# Patient Record
Sex: Female | Born: 1955
Health system: Southern US, Community
[De-identification: ages and names within clinical notes are randomized; demographics above are authoritative.]

## PROBLEM LIST (undated history)

## (undated) DIAGNOSIS — J449 Chronic obstructive pulmonary disease, unspecified: Secondary | ICD-10-CM

## (undated) DIAGNOSIS — K5792 Diverticulitis of intestine, part unspecified, without perforation or abscess without bleeding: Secondary | ICD-10-CM

## (undated) DIAGNOSIS — I10 Essential (primary) hypertension: Secondary | ICD-10-CM

## (undated) DIAGNOSIS — K5790 Diverticulosis of intestine, part unspecified, without perforation or abscess without bleeding: Secondary | ICD-10-CM

## (undated) DIAGNOSIS — J189 Pneumonia, unspecified organism: Secondary | ICD-10-CM

## (undated) DIAGNOSIS — F329 Major depressive disorder, single episode, unspecified: Secondary | ICD-10-CM

## (undated) DIAGNOSIS — M797 Fibromyalgia: Secondary | ICD-10-CM

## (undated) DIAGNOSIS — M7730 Calcaneal spur, unspecified foot: Secondary | ICD-10-CM

## (undated) DIAGNOSIS — F32A Depression, unspecified: Secondary | ICD-10-CM

## (undated) DIAGNOSIS — M069 Rheumatoid arthritis, unspecified: Secondary | ICD-10-CM

## (undated) DIAGNOSIS — G4733 Obstructive sleep apnea (adult) (pediatric): Secondary | ICD-10-CM

## (undated) HISTORY — DX: Calcaneal spur, unspecified foot: M77.30

## (undated) HISTORY — DX: Rheumatoid arthritis, unspecified: M06.9

## (undated) HISTORY — DX: Diverticulitis of intestine, part unspecified, without perforation or abscess without bleeding: K57.92

## (undated) HISTORY — DX: Essential (primary) hypertension: I10

## (undated) HISTORY — PX: ROTATOR CUFF REPAIR: SHX139

## (undated) HISTORY — DX: Depression, unspecified: F32.A

## (undated) HISTORY — DX: Morbid (severe) obesity due to excess calories: E66.01

## (undated) HISTORY — DX: Pneumonia, unspecified organism: J18.9

## (undated) HISTORY — DX: Obstructive sleep apnea (adult) (pediatric): G47.33

## (undated) HISTORY — DX: Chronic obstructive pulmonary disease, unspecified: J44.9

## (undated) HISTORY — DX: Fibromyalgia: M79.7

## (undated) HISTORY — PX: CYST REMOVAL TRUNK: SHX6283

## (undated) HISTORY — DX: Diverticulosis of intestine, part unspecified, without perforation or abscess without bleeding: K57.90

## (undated) HISTORY — DX: Major depressive disorder, single episode, unspecified: F32.9

---

## 1987-12-26 HISTORY — PX: TUBAL LIGATION: SHX77

## 2010-10-28 ENCOUNTER — Encounter: Payer: Self-pay | Admitting: Cardiology

## 2010-12-12 ENCOUNTER — Encounter: Payer: Self-pay | Admitting: Cardiology

## 2010-12-13 ENCOUNTER — Encounter: Payer: Self-pay | Admitting: Cardiology

## 2010-12-14 ENCOUNTER — Encounter: Payer: Self-pay | Admitting: Cardiology

## 2011-01-03 ENCOUNTER — Encounter: Payer: Self-pay | Admitting: Cardiology

## 2011-01-03 ENCOUNTER — Ambulatory Visit
Admission: RE | Admit: 2011-01-03 | Discharge: 2011-01-03 | Payer: Self-pay | Source: Home / Self Care | Attending: Cardiology | Admitting: Cardiology

## 2011-01-03 ENCOUNTER — Encounter (INDEPENDENT_AMBULATORY_CARE_PROVIDER_SITE_OTHER): Payer: Self-pay | Admitting: *Deleted

## 2011-01-03 DIAGNOSIS — J4489 Other specified chronic obstructive pulmonary disease: Secondary | ICD-10-CM | POA: Insufficient documentation

## 2011-01-03 DIAGNOSIS — G4733 Obstructive sleep apnea (adult) (pediatric): Secondary | ICD-10-CM | POA: Insufficient documentation

## 2011-01-03 DIAGNOSIS — I429 Cardiomyopathy, unspecified: Secondary | ICD-10-CM | POA: Insufficient documentation

## 2011-01-03 DIAGNOSIS — F17200 Nicotine dependence, unspecified, uncomplicated: Secondary | ICD-10-CM | POA: Insufficient documentation

## 2011-01-03 DIAGNOSIS — J449 Chronic obstructive pulmonary disease, unspecified: Secondary | ICD-10-CM | POA: Insufficient documentation

## 2011-01-03 DIAGNOSIS — I10 Essential (primary) hypertension: Secondary | ICD-10-CM | POA: Insufficient documentation

## 2011-01-03 DIAGNOSIS — R0602 Shortness of breath: Secondary | ICD-10-CM | POA: Insufficient documentation

## 2011-01-05 ENCOUNTER — Encounter: Payer: Self-pay | Admitting: Cardiology

## 2011-01-06 ENCOUNTER — Encounter: Payer: Self-pay | Admitting: Internal Medicine

## 2011-01-06 ENCOUNTER — Ambulatory Visit
Admission: RE | Admit: 2011-01-06 | Payer: Self-pay | Source: Home / Self Care | Attending: Internal Medicine | Admitting: Internal Medicine

## 2011-01-09 LAB — POCT I-STAT 3, ART BLOOD GAS (G3+)
Acid-base deficit: 1 mmol/L (ref 0.0–2.0)
Bicarbonate: 23 mEq/L (ref 20.0–24.0)
O2 Saturation: 93 %
TCO2: 24 mmol/L (ref 0–100)
pCO2 arterial: 36.2 mmHg (ref 35.0–45.0)
pH, Arterial: 7.412 — ABNORMAL HIGH (ref 7.350–7.400)
pO2, Arterial: 64 mmHg — ABNORMAL LOW (ref 80.0–100.0)

## 2011-01-09 LAB — POCT I-STAT 3, VENOUS BLOOD GAS (G3P V)
Acid-Base Excess: 1 mmol/L (ref 0.0–2.0)
Acid-base deficit: 1 mmol/L (ref 0.0–2.0)
Bicarbonate: 24.5 mEq/L — ABNORMAL HIGH (ref 20.0–24.0)
Bicarbonate: 26.3 mEq/L — ABNORMAL HIGH (ref 20.0–24.0)
O2 Saturation: 64 %
O2 Saturation: 65 %
TCO2: 26 mmol/L (ref 0–100)
TCO2: 28 mmol/L (ref 0–100)
pCO2, Ven: 44.2 mmHg — ABNORMAL LOW (ref 45.0–50.0)
pCO2, Ven: 45.6 mmHg (ref 45.0–50.0)
pH, Ven: 7.351 — ABNORMAL HIGH (ref 7.250–7.300)
pH, Ven: 7.368 — ABNORMAL HIGH (ref 7.250–7.300)
pO2, Ven: 35 mmHg (ref 30.0–45.0)
pO2, Ven: 35 mmHg (ref 30.0–45.0)

## 2011-01-11 ENCOUNTER — Encounter: Payer: Self-pay | Admitting: Internal Medicine

## 2011-01-15 NOTE — Procedures (Addendum)
NAMESANJUANA, MRUK             ACCOUNT NO.:  [id number]  MEDICAL RECORD NO.:  [id number]          PATIENT TYPE:  OIB  LOCATION:  1961                         FACILITY:  [facility]  PHYSICIAN:  Bevelyn Buckles. Jony Ladnier, MDDATE OF BIRTH:  October 31, 1956  DATE OF PROCEDURE:  01/06/2011 DATE OF DISCHARGE:  01/06/2011                           CARDIAC CATHETERIZATION   REFERRING PHYSICIAN:  Jonelle Sidle, MD  PRIMARY CARE PHYSICIAN:  Dr. Quintin Alto.  INDICATIONS:  Ms. Lords is a very pleasant 55 year old woman with a history of hypertension and tobacco abuse.  She was recently admitted to [facility] with pneumonia.  She was found to have a LV dysfunction with an EF of 35-40% range.  She is referred for catheterization to rule out to ischemic cardiomyopathy.  PROCEDURES PERFORMED: 1. Right heart cath. 2. Selective coronary angiography. 3. Left heart cath. 4. Left ventriculogram. 5. Abdominal aortogram to assess renal arteries in the setting of     severe hypertension.  DESCRIPTION OF PROCEDURE:  The risks and indication were explained. Consent was signed and placed on the chart.  The right groin was prepped and draped in routine sterile fashion, anesthetized with 1% local lidocaine.  A 4-French arterial sheath was placed in the right femoral artery and a 7-French venous sheath was placed in the right femoral vein using modified Seldinger technique.  Standard catheters including a JL- 4, 3-D RCA angled pigtail as well as Swan-Ganz catheter were used.  No apparent complications.  Central aortic pressure initially was in the systolics of 190, but then later in the case, it was 140/85 with a mean of 109.  LV pressure 138/19 with an EDP of 30.  Right atrial pressure mean of 13, RV pressure 59/11, EDP of 18, PA pressure 60/29 with a mean of 42.  Pulmonary capillary wedge pressure mean of 27.  Fick cardiac output 6.1.  Cardiac index 2.9, pulmonary vascular resistance was  2.5 Woods units.  Left main was normal.  LAD is a long vessel coursing to the apex, gave off two diagonals.  It was angiographically normal.  Left circumflex gave off a large branching OM-1.  OM-2 was angiographically normal.  Right coronary artery was a large dominant vessel gave off an RV branch, a large acute marginal PDA, and posterolaterals angiographically normal.  Of note, the artery did have a large hypertensive quality to them.  Left ventriculogram done in the RAO position showed an ejection fraction of approximately 35-40%.  Hypokinesis appeared worse in the inferior wall.  There is no significant mitral regurgitation.  Abdominal aortogram showed widely patent renal arteries bilaterally.  ASSESSMENT: 1. Normal coronary arteries. 2. Moderate left ventricular dysfunction as described above. 3. Normal renal arteries. 4. Significant volume overload with preserved cardiac output.  PLAN/DISCUSSION:  I suspect her cardiomyopathy may be hypertensive in nature.  She does have volume on board.  We will start on spironolactone 25 mg a day to help with her volume overload as well as her blood pressure.  She will follow up in the [facility] for continued titration of her heart failure medicines and her diuretic regimen as needed.  Bevelyn Buckles. Laquashia Mergenthaler, MD     DRB/MEDQ  D:  01/06/2011  T:  01/07/2011  Job:  [id number]  cc:   Quintin Alto  Electronically Signed by Arvilla Meres MD on 01/15/2011 07:13:07 PM

## 2011-01-23 ENCOUNTER — Ambulatory Visit
Admission: RE | Admit: 2011-01-23 | Discharge: 2011-01-23 | Payer: Self-pay | Source: Home / Self Care | Attending: Cardiology | Admitting: Cardiology

## 2011-01-26 NOTE — Letter (Signed)
Summary: Internal Correspondence/ FAXED PRE-CATH ORDER  Internal Correspondence/ FAXED PRE-CATH ORDER   Imported By: Dorise Hiss 01/06/2011 11:15:23  _____________________________________________________________________  External Attachment:    Type:   Image     Comment:   External Document

## 2011-01-26 NOTE — Letter (Signed)
Summary: Cardiac Cath Instructions - [facility]  [facility] [address]   [place], [place] [address]   Phone: [phone]  Fax: [phone]     01/03/2011 MRN: [id number]  Caroline Mason [address] [place], [place]  [address]  Dear [name],   You are scheduled for a Cardiac Catheterization on Friday, January 06, 2011 with Dr. Gala Romney.  Please arrive to the 1st floor of the Heart and Vascular Center at [facility] at 10:00 am on the day of your procedure. Please do not arrive before 6:30 a.m. Call the Heart and Vascular Center at [phone] if you are unable to make your appointmnet. The Code to get into the parking garage under the building is 0030. Take the elevators to the 1st floor. You must have someone to drive you home. Someone must be with you for the first 24 hours after you arrive home. Please wear clothes that are easy to get on and off and wear slip-on shoes. Do not eat or drink after midnight except water with your medications that morning. Bring all your medications and current insurance cards with you.  __X_ DO NOT take these medications before your procedure: Benazepril/HCTZ  ___ Make sure you take your aspirin.  __X_ You may take all of your remaining medications with water that morning.  ___ DO NOT take ANY medications before your procedure.  ___ Pre-med instructions:  ________________________________________________________________________  The usual length of stay after your procedure is 2 to 3 hours. This can vary.  If you have any questions, please call the office at the number listed above.  Cyril Loosen, RN, BSN            Directions to the [facility]  Please Note : Park in [place] under the building not the parking deck.  From [employer]: Turn onto [address] Left onto [place] (1st stoplight) Right at the brick entrance to the hospital (Main  circle drive) Bear to the right and you will see a blue sign "Heart and Vascular Center" Parking garage is a sharp right'to get through the gate out in the code ___0030____. Once you park, take the elevator to the first floor. Please do not arrive before 0630am. The building will be dark before that time.   From [address] Turn onto [address] Turn left into the brick entrance to the hospital (Main circle drive) Bear to the right and you will see a blue sign "[facility]" Parking garage is a sharp right, to get thru the gate put in the code _0030___. Once you park, take the elevator to the first floor. Please do not arrive before 0630am. The building will be dark before that time

## 2011-01-26 NOTE — Consult Note (Signed)
Summary: CARDIOPYLMONARY REPORT/DR.HENDERSON  CARDIOPYLMONARY REPORT/DR.HENDERSON   Imported By: Zachary George 01/03/2011 11:00:01  _____________________________________________________________________  External Attachment:    Type:   Image     Comment:   External Document

## 2011-01-26 NOTE — Assessment & Plan Note (Signed)
[facility]   Visit Type:  Follow-up Primary Provider:  Dr. Quintin Alto   History of Present Illness: 55 year old woman seen as an inpatient consult at [facility] back in December with newly diagnosed cardiomyopathy, LVEF 35-40%, noted in the setting of a possible atypical pneumonia on baseline hypertension and obstructive sleep apnea with ongoing tobacco abuse. At that time blood cultures were negative, and sputum culture grew Klebsiella pneumoniae.  She was managed with antibiotics that were continued as an outpatient, and felt somewhat better, although continues to have NYHA class 2-3 dyspnea on exertion and intermittent cough with a feeling of chest congestion and "mucous." She denies any fevers and chills. She also describes relative orthopnea, and intermittent mild lower extremity edema.  She states that sometimes her cough seems to be worse after she eats, sometimes even with liquids, suggesting a possible component of aspiration, although she has not had any formal swallowing evaluation or other definite reason for this.   Pulmonary function test done back in November 2011 revealed an FVC of 1.54 and an FEV1 of 1.31. Flow-volume loop described as normal in the expiratory phase, not completed in the inspiratory phase. Restrictive lung volumes with DLCO of 13.8 that corrects to normal when adjusted for alveolar volume. Study quality was influenced by frequent coughing. Study interpreted by Dr. Orson Aloe.  Symptoms have been present over the last several months to a year, with possible frequent episodes of bronchitis by report, not entirely clear based on available information.  Patient states she quit smoking around the time of her last hospitalization.    Preventive Screening-Counseling & Management  Alcohol-Tobacco     Smoking Status: current     Year Started: 1990     Year Quit: 10/2010     Pack years: <1/4 PPD 20+ years  Comments: patient stated she  hasn't smoked since November 2011  Current Medications (verified): 1)  Robitussin Dm 100-10 Mg/77ml Syrp (Dextromethorphan-Guaifenesin) .... As Needed 2)  Benazepril-Hydrochlorothiazide 20-25 Mg Tabs (Benazepril-Hydrochlorothiazide) .... Take 1 Tablet By Mouth Once A Day 3)  Carvedilol 6.25 Mg Tabs (Carvedilol) .... Take 1 Tablet By Mouth Two Times A Day 4)  Hydrochlorothiazide 50 Mg Tabs (Hydrochlorothiazide) .... Take 1/2-1 Tablet By Mouth Once A Day As Needed 5)  Fluticasone Propionate 50 Mcg/act Susp (Fluticasone Propionate) .... 2 Sprays/nostrils Daily  Allergies (verified): 1)  ! Asa  Comments:  Nurse/Medical Assistant: The patient's medication bottles and allergies were reviewed with the patient and were updated in the Medication and Allergy Lists.  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  Alcohol Use - no Smoking Status:  current Pack years:  <1/4 PPD 20+ years  Review of Systems       The patient complains of dyspnea on exertion, peripheral edema, and prolonged  cough.  The patient denies anorexia, fever, weight loss, chest pain, syncope, headaches, hemoptysis, abdominal pain,  melena, hematochezia, and severe indigestion/heartburn.         Otherwise negative except as outlined above.  Vital Signs:  Patient profile:   55 year old female Height:      65 inches Weight:      229 pounds BMI:     38.25 Pulse rate:   86 / minute BP sitting:   130 / 86  (left arm) Cuff size:   large  Vitals Entered By: Caroline Mason (January 03, 2011 1:48 PM)  Nutrition Counseling: Patient's BMI is greater than 25 and therefore counseled on weight management options.  Physical Exam  Additional Exam:  Obese woman in no acute distress. HEENT: Conjunctiva and lids normal, oropharynx clear with moist mucosa. Neck: Increased girth, no obvious thyromegaly, no carotid bruits, difficult to access JVP. Lungs: Clear, somewhat rhonchorous breath sounds, no wheezing or labored breathing, occasional cough. Cardiac: Indistinct PMI, regular rate and rhythm, no S3 or loud systolic murmur. Abdomen: Soft, nontender, bowel sounds present. Skin: Warm and dry. Musculoskeletal: No gross deformities. Extremities: No pitting edema, distal pulses 1+. Neuropsychiatric: Alert and oriented x3, affect appropriate.   Echocardiogram  Procedure date:  12/12/2010  Findings:      Mildly dilated LV with LVEF 35-40%, global hypokinesis with minor regional variation, mild to moderate left atrial enlargement, normal RV function, no significant valvular abnormalities, no pericardial effusion.  CXR  Procedure date:  12/10/2010  Findings:      Cardiomegaly with pulmonary interstitial thickening. Suspect pulmonary venous congestion. No overt congestive heart failure.  EKG  Procedure date:  12/13/2010  Findings:      Sinus rhythm at 97 beats per minute with left atrial enlargement, left ventricular hypertrophy with poor R-wave progression, nonspecific ST changes, corrected QT interval 498 ms.  Impression & Recommendations:  Problem # 1:  CARDIOMYOPATHY, SECONDARY (ICD-425.9)  Newly diagnosed, LVEF  35-40% with diffuse hypokinesis. There is some family history of cardiomyopathy in two of her sisters, and this may well be a nonischemic cardiomyopathy. She does have cardiac risk factors however including tobacco use and hypertension, and reports occasional atypical sounding left-sided chest pain and arm pain. Cardiac markers were normal during her hospitalization in December. We have discussed further diagnostic evaluation and plan a left and right heart catheterization to assess coronary anatomy and hemodynamics. Cardiac medications are new, and can be further adjusted based on this. Will obtain followup lab work, and schedule catheterization with Dr. Gala Romney later this week.  Her updated medication list for this problem includes:    Benazepril-hydrochlorothiazide 20-25 Mg Tabs (Benazepril-hydrochlorothiazide) .Marland Kitchen... Take 1 tablet by mouth once a day    Carvedilol 6.25 Mg Tabs (Carvedilol) .Marland Kitchen... Take 1 tablet by mouth two times a day    Hydrochlorothiazide 50 Mg Tabs (Hydrochlorothiazide) .Marland Kitchen... Take 1/2-1 tablet by mouth once a day as needed  Orders: Cardiac Catheterization (Cardiac Cath)  Problem # 2:  SHORTNESS OF BREATH (ICD-786.05)  Likely to some degree secondary to her cardiomyopathy, however also has associated cough with chest congestion, and the possibility of aspiration based on description of her symptoms. Depending on her catheterization results, she may need further investigation, potentially with a swallowing evaluation, and/or formal Pulmonary consultation. She has been treated for episodes of presumed bronchitis and recent atypical pneumonia.  Her updated medication list for this problem includes:    Benazepril-hydrochlorothiazide 20-25 Mg Tabs (Benazepril-hydrochlorothiazide) .Marland Kitchen... Take 1 tablet by mouth  once a day    Carvedilol 6.25 Mg Tabs (Carvedilol) .Marland Kitchen... Take 1 tablet by mouth two times a day    Hydrochlorothiazide 50 Mg Tabs (Hydrochlorothiazide) .Marland Kitchen... Take 1/2-1 tablet  by mouth once a day as needed  Orders: T-Basic Metabolic Panel [phone]) T-CBC No Diff ([id number]) T-Protime, Auto ([id number]) T-PTT ([id number])  Problem # 3:  ESSENTIAL HYPERTENSION, BENIGN (ICD-401.1)  Continue medical therapy, sodium restriction.  Her updated medication list for this problem includes:    Benazepril-hydrochlorothiazide 20-25 Mg Tabs (Benazepril-hydrochlorothiazide) .Marland Kitchen... Take 1 tablet by mouth once a day    Carvedilol 6.25 Mg Tabs (Carvedilol) .Marland Kitchen... Take 1 tablet by mouth two times a day    Hydrochlorothiazide 50 Mg Tabs (Hydrochlorothiazide) .Marland Kitchen... Take 1/2-1 tablet by mouth once a day as needed  Problem # 4:  COPD (ICD-496)  Does not seem to explain symptomatology.  Problem # 5:  TOBACCO ABUSE (ICD-305.1)  Patient has stopped smoking as of her last hospitalization in December.  Problem # 6:  OBSTRUCTIVE SLEEP APNEA (VOZ-366.44)  Not certain about adequacy of followup or treatment for this. May also impact her from the perspective of the cardiomyopathy.  Patient Instructions: 1)  Your physician has requested that you have a cardiac catheterization.  Cardiac catheterization is used to diagnose and/or treat various heart conditions. Doctors may recommend this procedure for a number of different reasons. The most common reason is to evaluate chest pain. Chest pain can be a symptom of coronary artery disease (CAD), and cardiac catheterization can show whether plaque is narrowing or blocking your heart's arteries. This procedure is also used to evaluate the valves, as well as measure the blood flow and oxygen levels in different parts of your heart.  For further information please visit [email or web address]  Please follow instruction sheet, as given. 2)  Your physician recommends that you go to the [facility] for lab work: DO TODAY.

## 2011-01-26 NOTE — Letter (Signed)
Summary: [facility] D/C DR. Wende Crease  [facility] D/C DR. Wende Crease   Imported By: Zachary George 01/03/2011 10:59:05  _____________________________________________________________________  External Attachment:    Type:   Image     Comment:   External Document

## 2011-01-26 NOTE — Consult Note (Signed)
Summary: CARDIOLOGY CONSULT [facility]  CARDIOLOGY CONSULT [facility]   Imported By: Zachary George 01/03/2011 11:00:43  _____________________________________________________________________  External Attachment:    Type:   Image     Comment:   External Document

## 2011-02-01 NOTE — Assessment & Plan Note (Signed)
Summary: EPH - POST CATH   Visit Type:  Follow-up Primary Provider:  Dr. Quintin Alto   History of Present Illness: 55 year old woman presents for followup. She was seen recently on 10 January with newly diagnosed cardiomyopathy, LVEF 35-40%, noted in the setting of a possible atypical pneumonia on baseline hypertension and obstructive sleep apnea with ongoing tobacco abuse. Subsequently she was referred for a cardiac catheterization, outlined below.  She had no evidence of significant CAD, hemodynamics as noted. Aldactone was added.  Followup labs from 18 January showed glucose 94, BUN 10, creatinine 0.6, sodium 140, potassium 4.0, BNP 251. We reviewed this today.  She continues to complain of fatigue, dyspnea on exertion at NYHA class 2-3. Also intermittent atypical chest pain. No palpitations or syncope.  I discussed the implications of her cardiomyopathy has a relates to further ongoing medication adjustments, potentially additional testing down the road to better assess functional capacity if her symptoms continue. She has not been weighing herself regularly. We discussed this, as well as sodium and fluid restriction. Also a regular exercise regimen.  Preventive Screening-Counseling & Management  Alcohol-Tobacco     Smoking Status: quit     Year Quit: 10/2010  Current Medications (verified): 1)  Robitussin Dm 100-10 Mg/19ml Syrp (Dextromethorphan-Guaifenesin) .... As Needed 2)  Benazepril-Hydrochlorothiazide 20-25 Mg Tabs (Benazepril-Hydrochlorothiazide) .... Take 1 Tablet By Mouth Once A Day 3)  Carvedilol 6.25 Mg Tabs (Carvedilol) .... Take 1 Tablet By Mouth Two Times A Day 4)  Hydrochlorothiazide 50 Mg Tabs (Hydrochlorothiazide) .... Take 1/2-1 Tablet By Mouth Once A Day As Needed 5)  Fluticasone Propionate 50 Mcg/act Susp (Fluticasone Propionate) .... 2 Sprays/nostrils Daily 6)  Ferrous Sulfate 325 (65 Fe) Mg Tabs (Ferrous Sulfate) .... Take 1 Tablet By Mouth Two Times A  Day 7)  Spironolactone 25 Mg Tabs (Spironolactone) .... Take 1 Tablet By Mouth Once A Day  Allergies (verified): 1)  ! Asa  Comments:  Nurse/Medical Assistant: The patient's medication bottles and allergies were reviewed with the patient and were updated in the Medication and Allergy Lists.  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  Alcohol Use - no  Social History: Smoking Status:  quit  Review of Systems  The patient denies anorexia, fever, weight gain, syncope, peripheral edema, prolonged cough, headaches, abdominal pain, melena, and hematochezia.         Otherwise reviewed and negative except as outlined.  Vital Signs:  Patient profile:   56 year old female Height:      65 inches Weight:      231 pounds Pulse rate:   78 / minute BP sitting:   129 / 84  (left arm) Cuff size:   large  Vitals Entered By: Carlye Grippe (January 23, 2011 2:54 PM)  Physical Exam  Additional Exam:  Obese woman in no acute distress. HEENT: Conjunctiva and lids normal, oropharynx clear with moist mucosa. Neck: Increased girth, no obvious thyromegaly, no carotid bruits, difficult to access JVP. Lungs: Clear, no wheezing or labored breathing. Cardiac: Indistinct PMI, regular rate and rhythm, no S3 or loud systolic murmur. Abdomen: Soft, nontender, bowel sounds present. Skin: Warm and dry. Musculoskeletal: No gross deformities. Extremities: No pitting edema, distal pulses 1+. Neuropsychiatric: Alert and oriented x3,  affect appropriate.   Cardiac Cath  Procedure date:  01/07/2011  Findings:       Central aortic pressure initially was in the   systolics  of 190, but then later in the case, it was 140/85 with a mean   of 109.  LV pressure 138/19 with an EDP of 30.  Right atrial pressure   mean of 13, RV pressure 59/11, EDP of 18, PA pressure 60/29 with a mean   of 42.  Pulmonary capillary wedge pressure mean of 27.  Fick cardiac   output 6.1.  Cardiac index 2.9, pulmonary vascular resistance was 2.5   Woods units.      Left main was normal.      LAD is a long vessel coursing to the apex, gave off two diagonals.  It   was angiographically normal.      Left circumflex gave off a large branching OM-1.  OM-2 was   angiographically normal.  Right coronary artery was a large dominant   vessel gave off an RV branch, a large acute marginal PDA, and   posterolaterals angiographically normal.      Of note, the artery did have a large hypertensive quality to them.      Left ventriculogram done in the RAO position showed an ejection fraction   of approximately 35-40%.  Hypokinesis appeared worse in the inferior   wall.  There is no significant mitral regurgitation.  Abdominal   aortogram showed widely patent renal arteries bilaterally.  Impression & Recommendations:  Problem # 1:  CARDIOMYOPATHY, SECONDARY (ICD-425.9)  Nonischemic cardiomyopathy. Plan to modify medical therapy by changing benazepril to 20 mg p.o. b.i.d., discontinuing hydrochlorothiazide, and using Lasix 20 mg p.o. p.r.n. instead. Otherwise continue present regimen. I'll have her follow up over the next month with repeat lab work.  The following medications were removed from the medication list:    Benazepril-hydrochlorothiazide 20-25 Mg Tabs (Benazepril-hydrochlorothiazide) .Marland Kitchen... Take 1 tablet by mouth once a day    Hydrochlorothiazide 50 Mg Tabs (Hydrochlorothiazide) .Marland Kitchen... Take 1/2-1 tablet by mouth once a day as needed Her updated medication list for this problem includes:    Carvedilol 6.25 Mg Tabs (Carvedilol) .Marland Kitchen... Take 1 tablet by mouth two times a day    Spironolactone  25 Mg Tabs (Spironolactone) .Marland Kitchen... Take 1 tablet by mouth once a day    Benazepril Hcl 20 Mg Tabs (Benazepril hcl) .Marland Kitchen... Take 1 tablet by mouth two times a day    Furosemide 20 Mg Tabs (Furosemide) .Marland Kitchen... Take one tablet by mouth as needed  Problem # 2:  ESSENTIAL HYPERTENSION, BENIGN (ICD-401.1)  Continue to work on blood pressure control. We have discussed weight loss, sodium restriction.  The following medications were removed from the medication list:    Benazepril-hydrochlorothiazide 20-25 Mg Tabs (Benazepril-hydrochlorothiazide) .Marland Kitchen... Take 1 tablet by mouth once a day    Hydrochlorothiazide 50 Mg Tabs (Hydrochlorothiazide) .Marland Kitchen... Take 1/2-1 tablet by mouth once a day as needed Her updated medication list for this problem includes:    Carvedilol 6.25 Mg Tabs (Carvedilol) .Marland Kitchen... Take 1 tablet by mouth two times a day    Spironolactone 25 Mg Tabs (Spironolactone) .Marland Kitchen... Take 1 tablet by mouth once a day    Benazepril Hcl 20 Mg Tabs (Benazepril hcl) .Marland Kitchen... Take 1 tablet by mouth two times a day    Furosemide 20 Mg Tabs (Furosemide) .Marland Kitchen... Take one tablet by mouth as needed  Problem # 3:  TOBACCO ABUSE (ICD-305.1)  Patient quit smoking back  in November, has not resumed.  Other Orders: Future Orders: T-Basic Metabolic Panel [phone]) ... 02/20/2011 T-BNP  (B Natriuretic Peptide) [phone]) ... 02/20/2011  Patient Instructions: 1)  Follow up with Dr. Diona Browner on Wed, March 01, 2011 at 1:40pm. 2)  Stop Benazepril/HCTZ and as needed HCTZ. 3)  Start Benazepril 20mg  by mouth two times a day. 4)  Start Lasix 20mg  by mouth as needed. 5)  Your physician recommends that you go to the [facility] for lab work: DO LABS A COUPLE OF DAYS BEFORE YOUR NEXT OFFICE VISIT. Prescriptions: FUROSEMIDE 20 MG TABS (FUROSEMIDE) Take one tablet by mouth as needed  #30 x 3   Entered by:   Cyril Loosen, RN, BSN   Authorized by:   Loreli Slot, MD, [facility]   Signed by:   Cyril Loosen, RN, BSN  on 01/23/2011   Method used:   Electronically to        [facility] # [phone]* (retail)       [address]       [place], [place]  [address]       Ph: [phone] or [phone]       Fax: [phone]   RxID:   [id number] BENAZEPRIL HCL 20 MG TABS (BENAZEPRIL HCL) Take 1 tablet by mouth two times a day  #30 x 6   Entered by:   Cyril Loosen, RN, BSN   Authorized by:   Loreli Slot, MD, [facility]   Signed by:   Cyril Loosen, RN, BSN on 01/23/2011   Method used:   Electronically to        [facility] # [phone]* (retail)       [address]       [place], [place]  [address]       Ph: [phone] or [phone]       Fax: [phone]   RxID:   [id number]

## 2011-02-21 ENCOUNTER — Encounter: Payer: Self-pay | Admitting: Cardiology

## 2011-02-23 ENCOUNTER — Encounter (INDEPENDENT_AMBULATORY_CARE_PROVIDER_SITE_OTHER): Payer: Self-pay | Admitting: *Deleted

## 2011-03-01 ENCOUNTER — Ambulatory Visit (INDEPENDENT_AMBULATORY_CARE_PROVIDER_SITE_OTHER): Payer: PRIVATE HEALTH INSURANCE | Admitting: Cardiology

## 2011-03-01 ENCOUNTER — Encounter: Payer: Self-pay | Admitting: Physician Assistant

## 2011-03-01 DIAGNOSIS — D649 Anemia, unspecified: Secondary | ICD-10-CM | POA: Insufficient documentation

## 2011-03-01 DIAGNOSIS — I5022 Chronic systolic (congestive) heart failure: Secondary | ICD-10-CM

## 2011-03-02 NOTE — Letter (Signed)
Summary: Engineer, materials at [facility]  [address]   [place], [place] [address]   Phone: [phone]  Fax: [phone]        February 23, 2011 MRN: [id number]    Caroline Mason [address] [place], [place]  [address]    Dear Ms. [facility],  Your test ordered by Caroline Mason has been reviewed by your physician (or physician assistant) and was found to be normal or stable. Your physician (or physician assistant) felt no changes were needed at this time.  ____ Echocardiogram  ____ Cardiac Stress Test  __X__ Lab Work-Stable...we will discuss further at your upcoming office visit.  ____ Peripheral vascular study of arms, legs or neck  ____ CT scan or X-ray  ____ Lung or Breathing test  ____ Other:   Thank you.   Cyril Loosen, RN, BSN    Duane Boston, M.D., F.A.C.C. Thressa Sheller, M.D., F.A.C.C. Oneal Grout, M.D., F.A.C.C. Cheree Ditto, M.D., F.A.C.C. Daiva Nakayama, M.D., F.A.C.C. Kenney Houseman, M.D., F.A.C.C. Jeanne Ivan, PA-C

## 2011-03-03 ENCOUNTER — Encounter: Payer: Self-pay | Admitting: Cardiology

## 2011-03-07 NOTE — Assessment & Plan Note (Signed)
Summary: 1 MO F/U PER 1/30 OV-JM   Visit Type:  Follow-up Primary Provider:  Dr. Quintin Alto   History of Present Illness: patient presents for scheduled followuparea  When last seen, medication adjustments, per Dr. Diona Browner, up notable for discontinuing HCTZ, increasing benazepril, and using Lasix p.r.n. Followup labs were drawn, every 28, notable for potassium 3.8, BUN 9, creatinine 0.7.  Patient continues to complain of easy fatigability, DOE, and occasional palpitations. She also has atypical anterior chest discomfort, associated with eating.  Of note, patient has OSA, but is not on CPAP, secondary to financial constraints. She has also not been able to return to work, citing extremely limited exercise tolerance, even with minimal exertion.  Preventive Screening-Counseling & Management  Alcohol-Tobacco     Smoking Status: quit     Year Quit: 2-3 months ago  Current Medications (verified): 1)  Robitussin Dm 100-10 Mg/55ml Syrp (Dextromethorphan-Guaifenesin) .... As Needed 2)  Carvedilol 6.25 Mg Tabs (Carvedilol) .... Take 1 Tablet By Mouth Two Times A Day 3)  Fluticasone Propionate 50 Mcg/act Susp (Fluticasone Propionate) .... 2 Sprays/nostrils Daily 4)  Ferrous Sulfate 325 (65 Fe) Mg Tabs (Ferrous Sulfate) .... Take 1 Tablet By Mouth Two Times A Day 5)  Spironolactone 25 Mg Tabs (Spironolactone) .... Take 1 Tablet By Mouth Once A Day 6)  Benazepril Hcl 20 Mg Tabs (Benazepril Hcl) .... Take 1 Tablet By Mouth Two Times A Day 7)  Furosemide 20 Mg Tabs (Furosemide) .... Take One Tablet By Mouth As Needed  Allergies (verified): 1)  ! Jonne Ply  Past History:  Past Medical History: Last updated: 01/03/2011 Hypertension Obstructive sleep apnea Cardiomyopathy, LVEF 35-40%  Atypical pneumonia 12/11 Depression Fibromyalgia Rheumatoid arthritis Morbid obesity C O P D  Review of Systems       No fevers, chills, hemoptysis, dysphagia, melena, hematocheezia, hematuria, rash,  claudication, orthopnea, pnd, pedal edema. All other systems negative.   Vital Signs:  Patient profile:   55 year old female Weight:      235 pounds Pulse rate:   76 / minute BP sitting:   146 / 95  (left arm) Cuff size:   large  Vitals Entered By: Hoover Brunette, LPN (February 28, 6605 1:59 PM) Is Patient Diabetic? No Comments 1 month f/u   Physical Exam  Additional Exam:  GEN: 55 year old female, obese, sitting upright, no distress HEENT: NCAT,PERRLA,EOMI NECK: palpable pulses, no bruits; no JVD; no TM LUNGS: CTA bilaterally HEART: RRR (S1S2); no significant murmurs; no rubs; no gallops ABD: soft, NT; intact BS EXT: intact distal pulses; no edema SKIN: warm, dry MUSC: no obvious deformity NEURO: A/O (x3)     Impression & Recommendations:  Problem # 1:  CARDIOMYOPATHY, SECONDARY (ICD-425.9)  Will increase carvedilol to 12.5 mg b.i.d., with goal of 25 mg b.i.d. Patient has gained 4 pounds, but attributes this to increased appetite. She does not present with symptoms suggestive of volume overload. We once again discussed the importance of dietary sodium restriction, as well as daily weights. I also recommended cardiac rehabilitation, which she will consider. Will plan return visit in 2 months, with Dr. Diona Browner.  Problem # 2:  ESSENTIAL HYPERTENSION, BENIGN (ICD-401.1)  needs better control. Coreg will be increased. Continue current dose of spironolactone and benazepril. Will check followup labs, including BNP level.  Problem # 3:  SHORTNESS OF BREATH (ICD-786.05)  continues to be plagued by significant DOE. Will order a 6 minute walk. May need referral to pulmonologist, which the patient suggested herself.  Problem # 4:  ANEMIA (ICD-285.9)  followed by Dr. Leandrew Koyanagi. Patient on supplemental iron. No current indication to add aspirin.  Appended Document: [facility]     Allergies: 1)  ! Jonne Ply   Other Orders: Cardiac Rehabilitation (Cardiac Rehab) T-Basic  Metabolic Panel [phone]) T-BNP  (B Natriuretic Peptide) [phone]) Six Minute Walk (Six Minute Walk)  Patient Instructions: 1)  Follow up with Dr. Diona Browner on  [facility], May 03, 2011 AT 1PM. 2)  Your physician recommends referral and attendance at a Cardiac Rehab Program. 3)  6-minute walk test. 4)  Your physician recommends that you go to the [facility] for lab work: BMET/BNP 5)  Increase Coreg (carvedilol) to 12.5mg  by mouth two times a day. You may take 2 of your 6.25mg  tablets until gone and then start new prescription for 12.5 mg tablets.  6)  No salt added diet. 7)  Weigh Daily. Prescriptions: CARVEDILOL 12.5 MG TABS (CARVEDILOL) Take one tablet by mouth twice a day  #60 x 6   Entered by:   Cyril Loosen, RN, BSN   Authorized by:   Nelida Meuse, PA-C   Signed by:   Cyril Loosen, RN, BSN on 03/01/2011   Method used:   Electronically to        [facility] # [phone]* (retail)       [address]       [place], [place]  [address]       Ph: [phone] or [phone]       Fax: [phone]   RxID:   [id number]

## 2011-03-08 ENCOUNTER — Encounter: Payer: Self-pay | Admitting: Physician Assistant

## 2011-03-09 ENCOUNTER — Encounter: Payer: Self-pay | Admitting: *Deleted

## 2011-03-14 NOTE — Miscellaneous (Signed)
Summary: Rehab Report/  FAXED CARDIAC REHAB REFERRAL  Rehab Report/  FAXED CARDIAC REHAB REFERRAL   Imported By: Dorise Hiss 03/09/2011 14:05:35  _____________________________________________________________________  External Attachment:    Type:   Image     Comment:   External Document

## 2011-03-14 NOTE — Letter (Signed)
Summary: Generic Engineer, agricultural at [facility] [address]   [place], [place] [address]   Phone: [phone]  Fax: [phone]        March 09, 2011 MRN: [id number]    Caroline Mason [address] [place], [place]  [address]    Dear Ms. Gigante,   We have attempted to reach you several times without success regarding your recent lab results.   Please contact our office at your earliest convenience for these results.      Sincerely,  Cyril Loosen, RN, BSN  This letter has been electronically signed by your physician.

## 2011-03-16 ENCOUNTER — Encounter: Payer: Self-pay | Admitting: *Deleted

## 2011-03-24 ENCOUNTER — Encounter: Payer: Self-pay | Admitting: Physician Assistant

## 2011-05-02 ENCOUNTER — Encounter: Payer: Self-pay | Admitting: Cardiology

## 2011-05-03 ENCOUNTER — Ambulatory Visit: Payer: PRIVATE HEALTH INSURANCE | Admitting: Cardiology

## 2011-05-08 ENCOUNTER — Other Ambulatory Visit: Payer: Self-pay | Admitting: *Deleted

## 2011-05-08 MED ORDER — BENAZEPRIL HCL 20 MG PO TABS: 20.0000 mg | ORAL_TABLET | Freq: Two times a day (BID) | ORAL | Status: DC

## 2011-06-05 ENCOUNTER — Encounter: Payer: Self-pay | Admitting: Cardiology

## 2011-06-05 ENCOUNTER — Ambulatory Visit (INDEPENDENT_AMBULATORY_CARE_PROVIDER_SITE_OTHER): Payer: PRIVATE HEALTH INSURANCE | Admitting: Cardiology

## 2011-06-05 DIAGNOSIS — I5022 Chronic systolic (congestive) heart failure: Secondary | ICD-10-CM

## 2011-06-05 DIAGNOSIS — I10 Essential (primary) hypertension: Secondary | ICD-10-CM

## 2011-06-05 DIAGNOSIS — I429 Cardiomyopathy, unspecified: Secondary | ICD-10-CM

## 2011-06-05 DIAGNOSIS — R0602 Shortness of breath: Secondary | ICD-10-CM

## 2011-06-05 MED ORDER — FUROSEMIDE 20 MG PO TABS: 20.0000 mg | ORAL_TABLET | Freq: Every day | ORAL | Status: DC

## 2011-06-05 MED ORDER — BENAZEPRIL HCL 20 MG PO TABS: 20.0000 mg | ORAL_TABLET | Freq: Two times a day (BID) | ORAL | Status: DC

## 2011-06-05 NOTE — Assessment & Plan Note (Signed)
Blood pressure is better controlled 

## 2011-06-05 NOTE — Patient Instructions (Signed)
Follow up as scheduled. Your physician has requested that you have an echocardiogram. Echocardiography is a painless test that uses sound waves to create images of your heart. It provides your doctor with information about the size and shape of your heart and how well your heart's chambers and valves are working. This procedure takes approximately one hour. There are no restrictions for this procedure. DO IN 6 WEEKS BEFORE APPT. Your physician recommends that you continue on your current medications as directed. Please refer to the Current Medication list given to you today.

## 2011-06-05 NOTE — Progress Notes (Signed)
Clinical Summary Ms. Dorff is a 55 y.o.female presenting for office followup. She was seen in March of this year, at that time complaining of continued dyspnea on exertion. Medical therapy adjustments were made. She was also referred for a 6 minute walk, covering approximately 448 m at brisk pace, no desaturation with exercise, but reported moderate dyspnea.  She was also initiated in cardiac rehabilitation, states that she has enjoyed this, and feels much better in terms of her exertional shortness of breath. She has better stamina, reports tolerating her medications. She states she plans to continue exercise in the maintenance program.  Denies any significant chest pain, palpitations, syncope.   Allergies  Allergen Reactions  . Aspirin     REACTION: GI UPSET    Current outpatient prescriptions:benazepril (LOTENSIN) 20 MG tablet, Take 1 tablet (20 mg total) by mouth 2 (two) times daily. PATIENT NEED OFFICE VISIT, Disp: 60 tablet, Rfl: 0;  carvedilol (COREG) 12.5 MG tablet, Take 12.5 mg by mouth 2 (two) times daily with a meal.  , Disp: , Rfl: ;  escitalopram (LEXAPRO) 20 MG tablet, Take 20 mg by mouth daily.  , Disp: , Rfl: ;  furosemide (LASIX) 20 MG tablet, Take 20 mg by mouth daily.  , Disp: , Rfl:  guaiFENesin-dextromethorphan (ROBITUSSIN DM) 100-10 MG/5ML syrup, Take 5 mLs by mouth 3 (three) times daily as needed.  , Disp: , Rfl: ;  sodium chloride (OCEAN) 0.65 % nasal spray, Place 1 spray into the nose as needed.  , Disp: , Rfl: ;  spironolactone (ALDACTONE) 25 MG tablet, Take 25 mg by mouth daily.  , Disp: , Rfl: ;  ferrous sulfate 325 (65 FE) MG tablet, Take 325 mg by mouth 2 (two) times daily.  , Disp: , Rfl:  DISCONTD: fluticasone (FLONASE) 50 MCG/ACT nasal spray, Place 2 sprays into the nose daily.  , Disp: , Rfl:   Past Medical History  Diagnosis Date  . Essential hypertension, benign   . Obstructive sleep apnea   . Cardiomyopathy     LVEF 35-40%  . Atypical pneumonia     12/11   . Depression   . Fibromyalgia   . Rheumatoid arthritis   . Morbid obesity   . COPD (chronic obstructive pulmonary disease)     Past Surgical History  Procedure Date  . Tubal ligation 1989    Family History  Problem Relation Age of Onset  . Stroke Mother   . Heart failure Sister   . Heart failure Sister     Social History Ms. Lathon reports that she has been smoking Cigarettes.  She has a 9 pack-year smoking history. She has never used smokeless tobacco. Ms. Fernando reports that she does not drink alcohol.  Review of Systems Otherwise reviewed and negative.  Physical Examination Filed Vitals:   06/05/11 1457  BP: 125/80  Pulse: 58  Overweight woman in no acute distress. HEENT: Conjunctiva and lids normal, oropharynx with moist mucosa. Neck: Supple, no elevated JVP or carotid bruits, no thyromegaly. Lungs: Clear to auscultation, nonlabored. Cardiac: Regular rate and rhythm, no S3 gallop. Abdomen: Soft, nontender, bowel sounds present. Skin: Warm and dry. Extremities: No pitting edema, distal pulses full. Musculoskeletal: No kyphosis. Neuropsychiatric: Alert and oriented x3, affect appropriate.   ECG Sinus rhythm at 66 beats per minute with poor anterior R wave progression.   Problem List and Plan

## 2011-06-05 NOTE — Assessment & Plan Note (Signed)
Doing relatively well, reportedly less shortness of breath and with more stamina, to complete the cardiac rehabilitation program next week. She reports tolerating her medications, and compliance with them. 6 minute walk test is reviewed above. Plan to continue medical therapy and followup with a 2-D echocardiogram prior to her next visit.

## 2011-06-05 NOTE — Assessment & Plan Note (Signed)
Nonischemic cardiomyopathy, normal coronary arteries at catheterization.

## 2011-06-08 ENCOUNTER — Encounter: Payer: Self-pay | Admitting: Cardiology

## 2011-07-13 DIAGNOSIS — I5022 Chronic systolic (congestive) heart failure: Secondary | ICD-10-CM

## 2011-07-18 ENCOUNTER — Ambulatory Visit (INDEPENDENT_AMBULATORY_CARE_PROVIDER_SITE_OTHER): Payer: PRIVATE HEALTH INSURANCE | Admitting: Cardiology

## 2011-07-18 ENCOUNTER — Encounter: Payer: Self-pay | Admitting: Cardiology

## 2011-07-18 VITALS — BP 143/88 | HR 48 | Resp 20 | Ht 65.0 in | Wt 227.1 lb

## 2011-07-18 DIAGNOSIS — I498 Other specified cardiac arrhythmias: Secondary | ICD-10-CM

## 2011-07-18 DIAGNOSIS — R001 Bradycardia, unspecified: Secondary | ICD-10-CM | POA: Insufficient documentation

## 2011-07-18 DIAGNOSIS — I5022 Chronic systolic (congestive) heart failure: Secondary | ICD-10-CM

## 2011-07-18 DIAGNOSIS — I429 Cardiomyopathy, unspecified: Secondary | ICD-10-CM

## 2011-07-18 MED ORDER — CARVEDILOL 6.25 MG PO TABS: 6.2500 mg | ORAL_TABLET | Freq: Two times a day (BID) | ORAL | Status: DC

## 2011-07-18 NOTE — Assessment & Plan Note (Signed)
Seems to be fairly euvolemic. No changes to present diuretic regimen. Reminded herabout diet and sodium restriction, also regular exercise.

## 2011-07-18 NOTE — Progress Notes (Signed)
Clinical Summary Caroline Mason is a 55 y.o.female presenting for followup. She was seen recently in June. Followup echocardiogram is noted below.  LVEF has improved somewhat on medical therapy. She states that she recently completed cardiac rehabilitation, also underwent some pulmonary function studies as part of a disability determination. She states she has been more fatigued in the last month. I reviewed her medications, also noted her to be fairly bradycardic.  She reports stable appetite, no progressive lower extremity edema, no orthopnea or PND. No palpitations or syncope.   Allergies  Allergen Reactions  . Aspirin     REACTION: GI UPSET    Current outpatient prescriptions:benazepril (LOTENSIN) 20 MG tablet, Take 1 tablet (20 mg total) by mouth 2 (two) times daily., Disp: 60 tablet, Rfl: 6;  escitalopram (LEXAPRO) 20 MG tablet, Take 20 mg by mouth daily.  , Disp: , Rfl: ;  furosemide (LASIX) 20 MG tablet, Take 1 tablet (20 mg total) by mouth daily., Disp: 30 tablet, Rfl: 6;  spironolactone (ALDACTONE) 25 MG tablet, Take 25 mg by mouth daily.  , Disp: , Rfl:  carvedilol (COREG) 6.25 MG tablet, Take 1 tablet (6.25 mg total) by mouth 2 (two) times daily., Disp: 60 tablet, Rfl: 6;  ferrous sulfate 325 (65 FE) MG tablet, Take 325 mg by mouth 2 (two) times daily.  , Disp: , Rfl: ;  fluticasone (FLONASE) 50 MCG/ACT nasal spray, Place 2 sprays into the nose daily.  , Disp: , Rfl:  guaiFENesin-dextromethorphan (ROBITUSSIN DM) 100-10 MG/5ML syrup, Take 5 mLs by mouth 3 (three) times daily as needed.  , Disp: , Rfl: ;  sodium chloride (OCEAN) 0.65 % nasal spray, Place 1 spray into the nose as needed.  , Disp: , Rfl:   Past Medical History  Diagnosis Date  . Essential hypertension, benign   . Obstructive sleep apnea   . Cardiomyopathy     Nonischemic, normal coronaries, LVEF 35-40%  . Atypical pneumonia     12/11  . Depression   . Fibromyalgia   . Rheumatoid arthritis   . Morbid obesity   . COPD  (chronic obstructive pulmonary disease)     Past Surgical History  Procedure Date  . Tubal ligation 1989    Family History  Problem Relation Age of Onset  . Stroke Mother   . Heart failure Sister 35  . Heart failure Sister 46    Social History Caroline Mason reports that she has been smoking Cigarettes.  She has a 9 pack-year smoking history. She has never used smokeless tobacco. Caroline Mason reports that she does not drink alcohol.  Review of Systems As outlined above, otherwise negative.  Physical Examination Filed Vitals:   07/18/11 1454  BP: 143/88  Pulse: 48  Resp: 20  Overweight woman in no acute distress.  HEENT: Conjunctiva and lids normal, oropharynx with moist mucosa.  Neck: Supple, no elevated JVP or carotid bruits, no thyromegaly.  Lungs: Clear to auscultation, nonlabored.  Cardiac: Regular rate and rhythm, no S3 gallop.  Abdomen: Soft, nontender, bowel sounds present.  Skin: Warm and dry.  Extremities: No pitting edema, distal pulses full.  Musculoskeletal: No kyphosis.  Neuropsychiatric: Alert and oriented x3, affect appropriate.    Studies Echocardiogram 07/13/2011: Mild LVH with global hypokinesis, LVEF 40-45%, diastolic dysfunction, mild left atrial enlargement, trace mitral regurgitation, mild tricuspid regurgitation, RVSP 36 mmHg.   Problem List and Plan

## 2011-07-18 NOTE — Assessment & Plan Note (Signed)
Reduce Coreg to 6.25 mg p.o. B.i.d. Perhaps bradycardia is leading to some functional limitation as well.

## 2011-07-18 NOTE — Assessment & Plan Note (Signed)
Nonischemic cardiomyopathy with normal coronary arteries at catheterization.

## 2011-07-18 NOTE — Patient Instructions (Signed)
   Decrease Coreg to 6.25mg  twice a day  Follow up in  3 months

## 2011-10-12 ENCOUNTER — Encounter: Payer: Self-pay | Admitting: Cardiology

## 2011-10-17 ENCOUNTER — Ambulatory Visit (INDEPENDENT_AMBULATORY_CARE_PROVIDER_SITE_OTHER): Payer: PRIVATE HEALTH INSURANCE | Admitting: Cardiology

## 2011-10-17 ENCOUNTER — Encounter: Payer: Self-pay | Admitting: Cardiology

## 2011-10-17 VITALS — BP 147/86 | HR 63 | Ht 65.0 in | Wt 233.8 lb

## 2011-10-17 DIAGNOSIS — I5022 Chronic systolic (congestive) heart failure: Secondary | ICD-10-CM

## 2011-10-17 DIAGNOSIS — I429 Cardiomyopathy, unspecified: Secondary | ICD-10-CM

## 2011-10-17 DIAGNOSIS — G4733 Obstructive sleep apnea (adult) (pediatric): Secondary | ICD-10-CM

## 2011-10-17 NOTE — Progress Notes (Signed)
Clinical Summary Caroline Mason is a 55 y.o.female presenting for followup. She was seen in July. She reports NYHA class II dyspnea on exertion, no chest pain or palpitations. Indicates compliance with her medications. She has not been exercising as regularly.  Reports a recent cold, no fevers or chills, no productive cough. She has not had a flu shot as yet.  She states that Dr. Leandrew Koyanagi has been following lipids, and also has recommended a followup sleep study. This is a very reasonable idea in light of her cardiomyopathy. She is not on CPAP therapy.   Allergies  Allergen Reactions  . Aspirin     REACTION: GI UPSET    Medication list reviewed.  Past Medical History  Diagnosis Date  . Essential hypertension, benign   . Obstructive sleep apnea   . Cardiomyopathy     Nonischemic, normal coronaries, LVEF 40-45%  . Atypical pneumonia     12/11  . Depression   . Fibromyalgia   . Rheumatoid arthritis   . Morbid obesity   . COPD (chronic obstructive pulmonary disease)     Past Surgical History  Procedure Date  . Tubal ligation 1989    Family History  Problem Relation Age of Onset  . Stroke Mother   . Heart failure Sister 12  . Heart failure Sister 80    Social History Ms. Brisky reports that she has been smoking Cigarettes.  She has a 9 pack-year smoking history. She has never used smokeless tobacco. Ms. Zagal reports that she does not drink alcohol.  Review of Systems Occasional arthritic discomfort affecting her hips. Stable appetite, no orthopnea or PND. No lower extremity edema. Otherwise negative.  Physical Examination Filed Vitals:   10/17/11 1403  BP: 147/86  Pulse: 63    Overweight woman in no acute distress.  HEENT: Conjunctiva and lids normal, oropharynx with moist mucosa.  Neck: Supple, no elevated JVP or carotid bruits, no thyromegaly.  Lungs: Clear to auscultation, nonlabored.  Cardiac: Regular rate and rhythm, no S3 gallop.  Abdomen: Soft, nontender,  bowel sounds present.  Skin: Warm and dry.  Extremities: No pitting edema, distal pulses full.  Musculoskeletal: No kyphosis.  Neuropsychiatric: Alert and oriented x3, affect appropriate.   Studies Echocardiogram 07/13/2011:  Mild LVH with global hypokinesis, LVEF 40-45%, diastolic dysfunction, mild left atrial enlargement, trace mitral regurgitation, mild tricuspid regurgitation, RVSP 36 mmHg.   Problem List and Plan

## 2011-10-17 NOTE — Assessment & Plan Note (Signed)
Stable on medical therapy, NYHA class II symptoms. No changes made today. I did review diet and sodium restriction, also exercise. Needs to keep an eye on blood pressure.

## 2011-10-17 NOTE — Assessment & Plan Note (Signed)
Nonischemic, normal coronary arteries at catheterization.

## 2011-10-17 NOTE — Patient Instructions (Signed)
Your physician wants you to follow-up in: 4 months. You will receive a reminder letter in the mail one-two months in advance. If you don't receive a letter, please call our office to schedule the follow-up appointment. Your physician recommends that you continue on your current medications as directed. Please refer to the Current Medication list given to you today. 

## 2011-10-17 NOTE — Assessment & Plan Note (Signed)
By history. I would agree with a followup sleep study, and strong consideration for CPAP therapy if indicated.

## 2012-01-09 ENCOUNTER — Other Ambulatory Visit: Payer: Self-pay | Admitting: *Deleted

## 2012-01-09 MED ORDER — SPIRONOLACTONE 25 MG PO TABS: 25.0000 mg | ORAL_TABLET | Freq: Every day | ORAL | Status: DC

## 2012-01-15 ENCOUNTER — Other Ambulatory Visit: Payer: Self-pay | Admitting: *Deleted

## 2012-01-15 MED ORDER — BENAZEPRIL HCL 20 MG PO TABS: 20.0000 mg | ORAL_TABLET | Freq: Two times a day (BID) | ORAL | Status: DC

## 2012-02-19 ENCOUNTER — Encounter: Payer: Self-pay | Admitting: Cardiology

## 2012-02-19 ENCOUNTER — Ambulatory Visit (INDEPENDENT_AMBULATORY_CARE_PROVIDER_SITE_OTHER): Payer: PRIVATE HEALTH INSURANCE | Admitting: Cardiology

## 2012-02-19 VITALS — BP 148/87 | HR 59 | Ht 67.0 in | Wt 244.0 lb

## 2012-02-19 DIAGNOSIS — I429 Cardiomyopathy, unspecified: Secondary | ICD-10-CM

## 2012-02-19 DIAGNOSIS — I5022 Chronic systolic (congestive) heart failure: Secondary | ICD-10-CM

## 2012-02-19 DIAGNOSIS — I10 Essential (primary) hypertension: Secondary | ICD-10-CM

## 2012-02-19 DIAGNOSIS — G4733 Obstructive sleep apnea (adult) (pediatric): Secondary | ICD-10-CM

## 2012-02-19 NOTE — Assessment & Plan Note (Signed)
She reports compliance with her medications. Blood pressure is up today. We discussed sodium restriction, also return to exercise.

## 2012-02-19 NOTE — Progress Notes (Signed)
   Clinical Summary Caroline Mason is a 56 y.o.female presenting for followup. She was seen in October 2012. She denies any progressive shortness of breath over baseline, no chest pain. She has not been exercising over the winter months. She does state that she plans to go back to the gym starting in March.  She reports compliance with her medications. Continues to have followup with Dr. Leandrew Koyanagi. Currently with NYHA class II dyspnea on exertion, no orthopnea, no lower extremity edema.  Echocardiogram from July 2012 is noted below.   Allergies  Allergen Reactions  . Aspirin     REACTION: GI UPSET    Current Outpatient Prescriptions  Medication Sig Dispense Refill  . benazepril (LOTENSIN) 20 MG tablet Take 1 tablet (20 mg total) by mouth 2 (two) times daily.  60 tablet  6  . carvedilol (COREG) 6.25 MG tablet Take 1 tablet (6.25 mg total) by mouth 2 (two) times daily.  60 tablet  6  . Chlorpheniramine-PSE-Ibuprofen (ADVIL ALLERGY SINUS) 2-30-200 MG TABS Take 1 tablet by mouth daily as needed.        Marland Kitchen escitalopram (LEXAPRO) 20 MG tablet Take 20 mg by mouth daily.        . furosemide (LASIX) 20 MG tablet Take 1 tablet (20 mg total) by mouth daily.  30 tablet  6  . guaiFENesin-dextromethorphan (ROBITUSSIN DM) 100-10 MG/5ML syrup Take 5 mLs by mouth 3 (three) times daily as needed.        . naproxen (NAPROSYN) 500 MG tablet Take 500 mg by mouth 2 (two) times daily with a meal. If needed for pain      . sodium chloride (OCEAN) 0.65 % nasal spray Place 1 spray into the nose as needed.        Marland Kitchen spironolactone (ALDACTONE) 25 MG tablet Take 1 tablet (25 mg total) by mouth daily.  30 tablet  6    Past Medical History  Diagnosis Date  . Essential hypertension, benign   . Obstructive sleep apnea   . Cardiomyopathy     Nonischemic, normal coronaries, LVEF 40-45%  . Atypical pneumonia     12/11  . Depression   . Fibromyalgia   . Rheumatoid arthritis   . Morbid obesity   . COPD (chronic  obstructive pulmonary disease)     Social History Caroline Mason reports that she has been smoking Cigarettes.  She has a 9 pack-year smoking history. She has never used smokeless tobacco. Caroline Mason reports that she does not drink alcohol.  Review of Systems Hot flashes. Otherwise reviewed and negative except as outlined above.  Physical Examination Filed Vitals:   02/19/12 1036  BP: 148/87  Pulse: 59    Overweight woman in no acute distress.  HEENT: Conjunctiva and lids normal, oropharynx with moist mucosa.  Neck: Supple, no elevated JVP or carotid bruits, no thyromegaly.  Lungs: Clear to auscultation, nonlabored.  Cardiac: Regular rate and rhythm, no S3 gallop.  Abdomen: Soft, nontender, bowel sounds present.  Skin: Warm and dry.  Extremities: No pitting edema, distal pulses full.  Musculoskeletal: No kyphosis.  Neuropsychiatric: Alert and oriented x3, affect appropriate.    Studies Echocardiogram 07/13/2011:  Mild LVH with global hypokinesis, LVEF 40-45%, diastolic dysfunction, mild left atrial enlargement, trace mitral regurgitation, mild tricuspid regurgitation, RVSP 36 mmHg.    Problem List and Plan

## 2012-02-19 NOTE — Assessment & Plan Note (Signed)
Nonischemic cardiomyopathy, plan to continue medical therapy. We discussed diet and exercise as well. Plan followup echocardiogram for her next visit.

## 2012-02-19 NOTE — Assessment & Plan Note (Signed)
Symptomatically stable on medical therapy. 

## 2012-02-19 NOTE — Assessment & Plan Note (Signed)
Patient states she underwent repeat sleep study and was found not to have diagnostic criteria for sleep apnea.

## 2012-02-19 NOTE — Patient Instructions (Signed)
   Echo just before next office visit  Your physician wants you to follow up in: 6 months.  You will receive a reminder letter in the mail one-two months in advance.  If you don't receive a letter, please call our office to schedule the follow up appointment  

## 2012-04-08 ENCOUNTER — Other Ambulatory Visit: Payer: Self-pay | Admitting: *Deleted

## 2012-04-08 MED ORDER — CARVEDILOL 6.25 MG PO TABS: 6.2500 mg | ORAL_TABLET | Freq: Two times a day (BID) | ORAL | Status: DC

## 2012-07-17 ENCOUNTER — Other Ambulatory Visit: Payer: Self-pay | Admitting: Cardiology

## 2012-07-17 ENCOUNTER — Telehealth: Payer: Self-pay | Admitting: Cardiology

## 2012-07-17 DIAGNOSIS — I429 Cardiomyopathy, unspecified: Secondary | ICD-10-CM

## 2012-07-17 NOTE — Telephone Encounter (Signed)
Scheduled Echo

## 2012-07-17 NOTE — Telephone Encounter (Signed)
Caroline Mason received a reminder that she needs Echo before ov with Dr. Diona Browner.

## 2012-07-25 ENCOUNTER — Other Ambulatory Visit: Payer: Self-pay

## 2012-07-25 MED ORDER — BENAZEPRIL HCL 20 MG PO TABS: 20.0000 mg | ORAL_TABLET | Freq: Two times a day (BID) | ORAL | Status: DC

## 2012-07-25 MED ORDER — FUROSEMIDE 20 MG PO TABS: 20.0000 mg | ORAL_TABLET | Freq: Every day | ORAL | Status: DC

## 2012-07-25 NOTE — Telephone Encounter (Signed)
..   Requested Prescriptions   Signed Prescriptions Disp Refills  . benazepril (LOTENSIN) 20 MG tablet 60 tablet 6    Sig: Take 1 tablet (20 mg total) by mouth 2 (two) times daily.    Authorizing Provider: MCDOWELL, Illene Bolus    Ordering User: Christella Hartigan, Rolly Magri Judie Petit

## 2012-07-25 NOTE — Telephone Encounter (Signed)
..   Requested Prescriptions   Signed Prescriptions Disp Refills  . furosemide (LASIX) 20 MG tablet 30 tablet 6    Sig: Take 1 tablet (20 mg total) by mouth daily.    Authorizing Provider: MCDOWELL, Illene Bolus    Ordering User: Christella Hartigan, Delise Simenson Judie Petit

## 2012-07-31 ENCOUNTER — Other Ambulatory Visit: Payer: Self-pay

## 2012-07-31 ENCOUNTER — Other Ambulatory Visit (INDEPENDENT_AMBULATORY_CARE_PROVIDER_SITE_OTHER): Payer: PRIVATE HEALTH INSURANCE

## 2012-07-31 DIAGNOSIS — I429 Cardiomyopathy, unspecified: Secondary | ICD-10-CM

## 2012-08-06 ENCOUNTER — Telehealth: Payer: Self-pay | Admitting: *Deleted

## 2012-08-06 NOTE — Telephone Encounter (Signed)
Patient informed. 

## 2012-08-06 NOTE — Telephone Encounter (Signed)
Message copied by Eustace Moore on Tue Aug 06, 2012 10:56 AM ------      Message from: Lesle Chris      Created: Thu Aug 01, 2012 10:15 AM       Diona Browner patient            ----- Message -----         From: Jonelle Sidle, MD         Sent: 07/31/2012   8:54 PM           To: Lesle Chris, LPN            LVEF stable if not slightly better at 45-50%. Looks good.

## 2012-08-19 ENCOUNTER — Ambulatory Visit (INDEPENDENT_AMBULATORY_CARE_PROVIDER_SITE_OTHER): Payer: PRIVATE HEALTH INSURANCE | Admitting: Cardiology

## 2012-08-19 ENCOUNTER — Encounter: Payer: Self-pay | Admitting: Cardiology

## 2012-08-19 VITALS — BP 143/84 | HR 60 | Ht 65.0 in | Wt 255.0 lb

## 2012-08-19 DIAGNOSIS — I429 Cardiomyopathy, unspecified: Secondary | ICD-10-CM

## 2012-08-19 DIAGNOSIS — I10 Essential (primary) hypertension: Secondary | ICD-10-CM

## 2012-08-19 NOTE — Progress Notes (Signed)
Clinical Summary Caroline Mason is a 56 y.o.female presenting for followup. She was seen in February. Weight is up approximately 10 pounds from last visit, does not seem to be clearly excess fluid weight however. Her LV function has improved over time. She does state that she has been exercising more regularly. We reviewed her diet, still seems to be carbohydrate heavy at times.  Echocardiogram done recently in August showed mild LVH with LVEF 45-50%, diffuse hypokinesis, grade 1 diastolic dysfunction, mild left atrial enlargement. We reviewed this.  She reports that she was taken off of Norvasc by Dr. Leandrew Koyanagi, concern about general fluid weight. She denies any frank lower extremity edema however, seemed to be tolerating the medicine. Her blood pressure is up today.   Allergies  Allergen Reactions  . Aspirin     REACTION: GI UPSET    Current Outpatient Prescriptions  Medication Sig Dispense Refill  . benazepril (LOTENSIN) 20 MG tablet Take 1 tablet (20 mg total) by mouth 2 (two) times daily.  60 tablet  6  . carvedilol (COREG) 6.25 MG tablet Take 1 tablet (6.25 mg total) by mouth 2 (two) times daily.  60 tablet  6  . Chlorpheniramine-PSE-Ibuprofen (ADVIL ALLERGY SINUS) 2-30-200 MG TABS Take 1 tablet by mouth daily as needed.        . docusate sodium (COLACE) 100 MG capsule Take 100 mg by mouth as needed.      Marland Kitchen escitalopram (LEXAPRO) 20 MG tablet Take 20 mg by mouth daily.        . fluticasone (FLONASE) 50 MCG/ACT nasal spray Place 2 sprays into the nose daily as needed.      . furosemide (LASIX) 20 MG tablet Take 1 tablet (20 mg total) by mouth daily.  30 tablet  6  . guaiFENesin-dextromethorphan (ROBITUSSIN DM) 100-10 MG/5ML syrup Take 5 mLs by mouth 3 (three) times daily as needed.        . naproxen (NAPROSYN) 500 MG tablet Take 500 mg by mouth 2 (two) times daily with a meal. If needed for pain      . pramipexole (MIRAPEX) 0.25 MG tablet Take 0.25 mg by mouth at bedtime.      . sodium  chloride (OCEAN) 0.65 % nasal spray Place 1 spray into the nose as needed.        Marland Kitchen spironolactone (ALDACTONE) 25 MG tablet Take 1 tablet (25 mg total) by mouth daily.  30 tablet  6    Past Medical History  Diagnosis Date  . Essential hypertension, benign   . Obstructive sleep apnea   . Cardiomyopathy     Nonischemic, normal coronaries, LVEF 40-45%  . Atypical pneumonia     12/11  . Depression   . Fibromyalgia   . Rheumatoid arthritis   . Morbid obesity   . COPD (chronic obstructive pulmonary disease)     Social History Ms. Sedlack reports that she has been smoking Cigarettes.  She has a 9 pack-year smoking history. She has never used smokeless tobacco. Ms. Spanbauer reports that she does not drink alcohol.  Review of Systems No palpitations or syncope. Otherwise negative.  Physical Examination Filed Vitals:   08/19/12 1106  BP: 143/84  Pulse: 60    Overweight woman in no acute distress.  HEENT: Conjunctiva and lids normal, oropharynx with moist mucosa.  Neck: Supple, no elevated JVP or carotid bruits, no thyromegaly.  Lungs: Clear to auscultation, nonlabored.  Cardiac: Regular rate and rhythm, no S3 gallop.  Abdomen: Soft, nontender,  bowel sounds present.  Skin: Warm and dry.  Extremities: No pitting edema, distal pulses full.  Musculoskeletal: No kyphosis.  Neuropsychiatric: Alert and oriented x3, affect appropriate.   Problem List and Plan   CARDIOMYOPATHY, SECONDARY LVEF now 45-50% on medical therapy. I encouraged her to continue regular exercise. Also discussed diet, portion size, limiting carbohydrates, weight loss.  ESSENTIAL HYPERTENSION, BENIGN Blood pressure elevated today. Recommended that she start back on Norvasc at 5 mg a day. Certainly, if lower extremity edema becomes a problem, we may need to stop the medication altogether.    Jonelle Sidle, M.D., F.A.C.C.

## 2012-08-19 NOTE — Assessment & Plan Note (Signed)
LVEF now 45-50% on medical therapy. I encouraged her to continue regular exercise. Also discussed diet, portion size, limiting carbohydrates, weight loss.

## 2012-08-19 NOTE — Patient Instructions (Addendum)

## 2012-08-19 NOTE — Assessment & Plan Note (Signed)
Blood pressure elevated today. Recommended that she start back on Norvasc at 5 mg a day. Certainly, if lower extremity edema becomes a problem, we may need to stop the medication altogether.

## 2012-09-02 ENCOUNTER — Other Ambulatory Visit: Payer: Self-pay | Admitting: Cardiology

## 2012-09-02 MED ORDER — SPIRONOLACTONE 25 MG PO TABS: 25.0000 mg | ORAL_TABLET | Freq: Every day | ORAL | Status: DC

## 2012-11-25 ENCOUNTER — Other Ambulatory Visit: Payer: Self-pay | Admitting: Cardiology

## 2012-11-25 MED ORDER — CARVEDILOL 6.25 MG PO TABS: 6.2500 mg | ORAL_TABLET | Freq: Two times a day (BID) | ORAL | Status: DC

## 2013-02-18 ENCOUNTER — Ambulatory Visit: Payer: PRIVATE HEALTH INSURANCE | Admitting: Cardiology

## 2013-03-25 ENCOUNTER — Other Ambulatory Visit: Payer: Self-pay | Admitting: *Deleted

## 2013-03-25 MED ORDER — BENAZEPRIL HCL 20 MG PO TABS: 20.0000 mg | ORAL_TABLET | Freq: Two times a day (BID) | ORAL | Status: DC

## 2013-04-04 ENCOUNTER — Ambulatory Visit: Payer: PRIVATE HEALTH INSURANCE | Admitting: Cardiology

## 2013-04-28 ENCOUNTER — Other Ambulatory Visit: Payer: Self-pay | Admitting: *Deleted

## 2013-04-28 MED ORDER — SPIRONOLACTONE 25 MG PO TABS: 25.0000 mg | ORAL_TABLET | Freq: Every day | ORAL | Status: DC

## 2013-05-15 ENCOUNTER — Encounter: Payer: Self-pay | Admitting: Cardiology

## 2013-05-15 ENCOUNTER — Ambulatory Visit (INDEPENDENT_AMBULATORY_CARE_PROVIDER_SITE_OTHER): Payer: PRIVATE HEALTH INSURANCE | Admitting: Cardiology

## 2013-05-15 VITALS — BP 133/77 | HR 67 | Ht 65.0 in | Wt 245.0 lb

## 2013-05-15 DIAGNOSIS — I10 Essential (primary) hypertension: Secondary | ICD-10-CM

## 2013-05-15 DIAGNOSIS — I429 Cardiomyopathy, unspecified: Secondary | ICD-10-CM

## 2013-05-15 NOTE — Assessment & Plan Note (Signed)
Blood pressure is reasonably well controlled today, no changes made.

## 2013-05-15 NOTE — Progress Notes (Signed)
Clinical Summary Caroline Mason is a 57 y.o.female last seen in August 2013. She reports stable NYHA class II dyspnea, no chest pain. Blood pressure control has been reasonably good by her report. She indicates compliance with her medications.  Echocardiogram in August 2013 demonstrated normal LV chamber size with mild LVH, LVEF 45-50% with diffuse hypokinesis, grade 1 diastolic dysfunction, mild left atrial enlargement.  ECG today shows sinus rhythm with leftward axis and poor anterior R wave progression which is old.  Weight is down 10 pounds from last visit.   Allergies  Allergen Reactions  . Aspirin     REACTION: GI UPSET    Current Outpatient Prescriptions  Medication Sig Dispense Refill  . amLODipine (NORVASC) 10 MG tablet Take 10 mg by mouth daily.      . benazepril (LOTENSIN) 20 MG tablet Take 1 tablet (20 mg total) by mouth 2 (two) times daily.  60 tablet  6  . carvedilol (COREG) 6.25 MG tablet Take 1 tablet (6.25 mg total) by mouth 2 (two) times daily.  60 tablet  6  . clindamycin (CLEOCIN T) 1 % external solution Apply 1 application topically 2 (two) times daily.      Marland Kitchen doxycycline (MONODOX) 100 MG capsule Take 100 mg by mouth daily.      Marland Kitchen escitalopram (LEXAPRO) 20 MG tablet Take 20 mg by mouth daily.        . fluticasone (FLONASE) 50 MCG/ACT nasal spray Place 2 sprays into the nose daily.       . furosemide (LASIX) 20 MG tablet Take 1 tablet (20 mg total) by mouth daily.  30 tablet  6  . pramipexole (MIRAPEX) 0.25 MG tablet Take 0.25 mg by mouth at bedtime.      Marland Kitchen spironolactone (ALDACTONE) 25 MG tablet Take 1 tablet (25 mg total) by mouth daily.  30 tablet  6  . traMADol (ULTRAM) 50 MG tablet Take 50-100 mg by mouth 2 (two) times daily.      . Chlorpheniramine-PSE-Ibuprofen (ADVIL ALLERGY SINUS) 2-30-200 MG TABS Take 1 tablet by mouth daily as needed.        . docusate sodium (COLACE) 100 MG capsule Take 100 mg by mouth as needed.      Marland Kitchen guaiFENesin-dextromethorphan  (ROBITUSSIN DM) 100-10 MG/5ML syrup Take 5 mLs by mouth 3 (three) times daily as needed.        . naproxen (NAPROSYN) 500 MG tablet Take 500 mg by mouth 2 (two) times daily with a meal. If needed for pain      . sodium chloride (OCEAN) 0.65 % nasal spray Place 1 spray into the nose as needed.         No current facility-administered medications for this visit.    Past Medical History  Diagnosis Date  . Essential hypertension, benign   . Obstructive sleep apnea   . Cardiomyopathy     Nonischemic, normal coronaries, LVEF 40-45%  . Atypical pneumonia     12/11  . Depression   . Fibromyalgia   . Rheumatoid arthritis   . Morbid obesity   . COPD (chronic obstructive pulmonary disease)     Social History Caroline Mason reports that she has been smoking Cigarettes.  She has a 9 pack-year smoking history. She has never used smokeless tobacco. Caroline Mason reports that she does not drink alcohol.  Review of Systems No palpitations, dizziness. No syncope. No leg edema. Otherwise negative.  Physical Examination Filed Vitals:   05/15/13 1354  BP: 133/77  Pulse: 67   Filed Weights   05/15/13 1354  Weight: 245 lb (111.131 kg)    Overweight woman in no acute distress.  HEENT: Conjunctiva and lids normal, oropharynx with moist mucosa.  Neck: Supple, no elevated JVP or carotid bruits, no thyromegaly.  Lungs: Clear to auscultation, nonlabored.  Cardiac: Regular rate and rhythm, no S3 gallop.  Abdomen: Soft, nontender, bowel sounds present.  Skin: Warm and dry.  Extremities: No pitting edema, distal pulses full.  Musculoskeletal: No kyphosis.  Neuropsychiatric: Alert and oriented x3, affect appropriate.   Problem List and Plan   CARDIOMYOPATHY, SECONDARY Nonischemic with normal coronaries, most recent LVEF 45-50%. Continue medical therapy, diet and exercise.  ESSENTIAL HYPERTENSION, BENIGN Blood pressure is reasonably well controlled today, no changes made.    Jonelle Sidle,  M.D., F.A.C.C.

## 2013-05-15 NOTE — Patient Instructions (Addendum)

## 2013-05-15 NOTE — Assessment & Plan Note (Signed)
Nonischemic with normal coronaries, most recent LVEF 45-50%. Continue medical therapy, diet and exercise.

## 2013-07-21 ENCOUNTER — Other Ambulatory Visit: Payer: Self-pay | Admitting: Cardiology

## 2013-07-21 MED ORDER — CARVEDILOL 6.25 MG PO TABS: 6.2500 mg | ORAL_TABLET | Freq: Two times a day (BID) | ORAL | Status: DC

## 2013-07-21 NOTE — Telephone Encounter (Signed)
Error

## 2013-08-08 ENCOUNTER — Other Ambulatory Visit: Payer: Self-pay | Admitting: Cardiology

## 2013-08-08 MED ORDER — FUROSEMIDE 20 MG PO TABS: 20.0000 mg | ORAL_TABLET | Freq: Every day | ORAL | Status: DC

## 2013-11-24 ENCOUNTER — Other Ambulatory Visit: Payer: Self-pay | Admitting: Cardiology

## 2013-11-24 MED ORDER — BENAZEPRIL HCL 20 MG PO TABS: 20.0000 mg | ORAL_TABLET | Freq: Two times a day (BID) | ORAL | Status: DC

## 2014-01-07 ENCOUNTER — Other Ambulatory Visit: Payer: Self-pay | Admitting: Cardiology

## 2014-01-07 MED ORDER — SPIRONOLACTONE 25 MG PO TABS: 25.0000 mg | ORAL_TABLET | Freq: Every day | ORAL | Status: DC

## 2014-02-13 ENCOUNTER — Encounter: Payer: Self-pay | Admitting: Cardiology

## 2014-02-13 ENCOUNTER — Ambulatory Visit (INDEPENDENT_AMBULATORY_CARE_PROVIDER_SITE_OTHER): Payer: Medicare Other | Admitting: Cardiology

## 2014-02-13 VITALS — BP 131/82 | HR 72 | Ht 65.0 in | Wt 245.0 lb

## 2014-02-13 DIAGNOSIS — I429 Cardiomyopathy, unspecified: Secondary | ICD-10-CM

## 2014-02-13 DIAGNOSIS — I5022 Chronic systolic (congestive) heart failure: Secondary | ICD-10-CM

## 2014-02-13 NOTE — Progress Notes (Signed)
Clinical Summary Caroline Mason is a 58 y.o.female last seen in May 2014. She has been doing well from a cardiac perspective, stable NYHA class II dyspnea, no chest pain or palpitations.  She reports compliance with her cardiac medications. Not as active recently during the winter. Also had a bout with plantar fasciitis which limited her walking regimen. Weight is stable  Echocardiogram in August 2013 demonstrated normal LV chamber size with mild LVH, LVEF 45-50% with diffuse hypokinesis, grade 1 diastolic dysfunction, mild left atrial enlargement.   Allergies  Allergen Reactions  . Aspirin     REACTION: GI UPSET    Current Outpatient Prescriptions  Medication Sig Dispense Refill  . amLODipine (NORVASC) 10 MG tablet Take 10 mg by mouth daily.      . benazepril (LOTENSIN) 20 MG tablet Take 1 tablet (20 mg total) by mouth 2 (two) times daily.  60 tablet  2  . carvedilol (COREG) 6.25 MG tablet Take 1 tablet (6.25 mg total) by mouth 2 (two) times daily.  60 tablet  6  . Chlorpheniramine-PSE-Ibuprofen (ADVIL ALLERGY SINUS) 2-30-200 MG TABS Take 1 tablet by mouth daily as needed.        . clindamycin (CLEOCIN T) 1 % external solution Apply 1 application topically 2 (two) times daily.      Marland Kitchen docusate sodium (COLACE) 100 MG capsule Take 100 mg by mouth as needed.      Marland Kitchen escitalopram (LEXAPRO) 20 MG tablet Take 20 mg by mouth daily.        . fluticasone (FLONASE) 50 MCG/ACT nasal spray Place 2 sprays into the nose as needed.       . furosemide (LASIX) 20 MG tablet Take 1 tablet (20 mg total) by mouth daily.  30 tablet  6  . guaiFENesin-dextromethorphan (ROBITUSSIN DM) 100-10 MG/5ML syrup Take 5 mLs by mouth 3 (three) times daily as needed.        . naproxen (NAPROSYN) 500 MG tablet Take 500 mg by mouth 2 (two) times daily with a meal. If needed for pain      . pramipexole (MIRAPEX) 0.25 MG tablet Take 0.25 mg by mouth at bedtime.      Marland Kitchen spironolactone (ALDACTONE) 25 MG tablet Take 1 tablet (25  mg total) by mouth daily.  30 tablet  1  . traMADol (ULTRAM) 50 MG tablet Take 50-100 mg by mouth 2 (two) times daily.      . sodium chloride (OCEAN) 0.65 % nasal spray Place 1 spray into the nose as needed.         No current facility-administered medications for this visit.    Past Medical History  Diagnosis Date  . Essential hypertension, benign   . Obstructive sleep apnea   . Cardiomyopathy     Nonischemic, normal coronaries, LVEF 40-45%  . Atypical pneumonia     12/11  . Depression   . Fibromyalgia   . Rheumatoid arthritis(714.0)   . Morbid obesity   . COPD (chronic obstructive pulmonary disease)     Social History Ms. Baune reports that she has been smoking Cigarettes.  She has a 9 pack-year smoking history. She has never used smokeless tobacco. Ms. Pigman reports that she does not drink alcohol.  Review of Systems Recent episode of bronchitis. Otherwise negative except as outlined.  Physical Examination Filed Vitals:   02/13/14 1502  BP: 131/82  Pulse: 72   Filed Weights   02/13/14 1502  Weight: 245 lb (111.131 kg)  Overweight woman in no acute distress.  HEENT: Conjunctiva and lids normal, oropharynx with moist mucosa.  Neck: Supple, no elevated JVP or carotid bruits, no thyromegaly.  Lungs: Clear to auscultation, nonlabored.  Cardiac: Regular rate and rhythm, no S3 gallop.  Abdomen: Soft, nontender, bowel sounds present.  Skin: Warm and dry.  Extremities: No pitting edema, distal pulses full.    Problem List and Plan   CARDIOMYOPATHY, SECONDARY LVEF up to the range of 45-50% on medical therapy.  Chronic systolic heart failure Clinically stable, volume status looks adequate, no change to current regimen.    Satira Sark, M.D., F.A.C.C.

## 2014-02-13 NOTE — Assessment & Plan Note (Signed)
Clinically stable, volume status looks adequate, no change to current regimen.

## 2014-02-13 NOTE — Patient Instructions (Signed)

## 2014-02-13 NOTE — Assessment & Plan Note (Signed)
LVEF up to the range of 45-50% on medical therapy.

## 2014-03-18 ENCOUNTER — Other Ambulatory Visit: Payer: Self-pay | Admitting: Cardiology

## 2014-03-18 MED ORDER — SPIRONOLACTONE 25 MG PO TABS: 25.0000 mg | ORAL_TABLET | Freq: Every day | ORAL | Status: DC

## 2014-03-30 ENCOUNTER — Other Ambulatory Visit: Payer: Self-pay | Admitting: Cardiology

## 2014-03-30 MED ORDER — BENAZEPRIL HCL 20 MG PO TABS: 20.0000 mg | ORAL_TABLET | Freq: Two times a day (BID) | ORAL | Status: DC

## 2014-04-02 ENCOUNTER — Other Ambulatory Visit: Payer: Self-pay | Admitting: *Deleted

## 2014-04-02 MED ORDER — CARVEDILOL 6.25 MG PO TABS: 6.2500 mg | ORAL_TABLET | Freq: Two times a day (BID) | ORAL | Status: DC

## 2014-09-01 ENCOUNTER — Other Ambulatory Visit: Payer: Self-pay | Admitting: *Deleted

## 2014-09-01 MED ORDER — FUROSEMIDE 20 MG PO TABS: 20.0000 mg | ORAL_TABLET | Freq: Every day | ORAL | Status: DC

## 2014-11-05 ENCOUNTER — Other Ambulatory Visit: Payer: Self-pay | Admitting: *Deleted

## 2014-11-05 MED ORDER — SPIRONOLACTONE 25 MG PO TABS: 25.0000 mg | ORAL_TABLET | Freq: Every day | ORAL | Status: DC

## 2014-12-25 HISTORY — PX: WRIST SURGERY: SHX841

## 2014-12-31 ENCOUNTER — Other Ambulatory Visit: Payer: Self-pay | Admitting: *Deleted

## 2014-12-31 MED ORDER — CARVEDILOL 6.25 MG PO TABS: 6.2500 mg | ORAL_TABLET | Freq: Two times a day (BID) | ORAL | Status: DC

## 2015-01-19 DIAGNOSIS — M65312 Trigger thumb, left thumb: Secondary | ICD-10-CM | POA: Diagnosis not present

## 2015-01-19 DIAGNOSIS — M0580 Other rheumatoid arthritis with rheumatoid factor of unspecified site: Secondary | ICD-10-CM | POA: Diagnosis not present

## 2015-01-19 DIAGNOSIS — M797 Fibromyalgia: Secondary | ICD-10-CM | POA: Diagnosis not present

## 2015-01-19 DIAGNOSIS — I10 Essential (primary) hypertension: Secondary | ICD-10-CM | POA: Diagnosis not present

## 2015-02-15 ENCOUNTER — Telehealth: Payer: Self-pay | Admitting: *Deleted

## 2015-02-15 MED ORDER — SPIRONOLACTONE 25 MG PO TABS: 25.0000 mg | ORAL_TABLET | Freq: Every day | ORAL | Status: DC

## 2015-02-15 NOTE — Telephone Encounter (Signed)
Refill request for spironolactone 25 mg daily. Pt needs appt for further refills

## 2015-02-16 DIAGNOSIS — E559 Vitamin D deficiency, unspecified: Secondary | ICD-10-CM | POA: Diagnosis not present

## 2015-02-16 DIAGNOSIS — R7301 Impaired fasting glucose: Secondary | ICD-10-CM | POA: Diagnosis not present

## 2015-02-16 DIAGNOSIS — D509 Iron deficiency anemia, unspecified: Secondary | ICD-10-CM | POA: Diagnosis not present

## 2015-02-16 DIAGNOSIS — I10 Essential (primary) hypertension: Secondary | ICD-10-CM | POA: Diagnosis not present

## 2015-02-16 DIAGNOSIS — E782 Mixed hyperlipidemia: Secondary | ICD-10-CM | POA: Diagnosis not present

## 2015-03-30 ENCOUNTER — Other Ambulatory Visit: Payer: Self-pay | Admitting: Cardiology

## 2015-03-30 MED ORDER — SPIRONOLACTONE 25 MG PO TABS: 25.0000 mg | ORAL_TABLET | Freq: Every day | ORAL | Status: DC

## 2015-03-30 NOTE — Telephone Encounter (Signed)
Received fax refill request  Rx # [id number] Medication:  Spironolactone 25 mg tablet Qty 30 Sig:  Take one tablet by mouth once daily Physician:  Domenic Polite

## 2015-04-07 DIAGNOSIS — M0540 Rheumatoid myopathy with rheumatoid arthritis of unspecified site: Secondary | ICD-10-CM | POA: Diagnosis not present

## 2015-04-07 DIAGNOSIS — H10413 Chronic giant papillary conjunctivitis, bilateral: Secondary | ICD-10-CM | POA: Diagnosis not present

## 2015-04-07 DIAGNOSIS — H04123 Dry eye syndrome of bilateral lacrimal glands: Secondary | ICD-10-CM | POA: Diagnosis not present

## 2015-04-07 DIAGNOSIS — Z049 Encounter for examination and observation for unspecified reason: Secondary | ICD-10-CM | POA: Diagnosis not present

## 2015-04-07 DIAGNOSIS — Z09 Encounter for follow-up examination after completed treatment for conditions other than malignant neoplasm: Secondary | ICD-10-CM | POA: Diagnosis not present

## 2015-04-15 DIAGNOSIS — Z79899 Other long term (current) drug therapy: Secondary | ICD-10-CM | POA: Diagnosis not present

## 2015-04-20 DIAGNOSIS — M170 Bilateral primary osteoarthritis of knee: Secondary | ICD-10-CM | POA: Diagnosis not present

## 2015-04-20 DIAGNOSIS — M19041 Primary osteoarthritis, right hand: Secondary | ICD-10-CM | POA: Diagnosis not present

## 2015-04-20 DIAGNOSIS — M7071 Other bursitis of hip, right hip: Secondary | ICD-10-CM | POA: Diagnosis not present

## 2015-04-20 DIAGNOSIS — M0570 Rheumatoid arthritis with rheumatoid factor of unspecified site without organ or systems involvement: Secondary | ICD-10-CM | POA: Diagnosis not present

## 2015-04-21 DIAGNOSIS — B078 Other viral warts: Secondary | ICD-10-CM | POA: Diagnosis not present

## 2015-05-05 DIAGNOSIS — Z0000 Encounter for general adult medical examination without abnormal findings: Secondary | ICD-10-CM | POA: Diagnosis not present

## 2015-06-02 DIAGNOSIS — Z0001 Encounter for general adult medical examination with abnormal findings: Secondary | ICD-10-CM | POA: Diagnosis not present

## 2015-06-02 DIAGNOSIS — Z1389 Encounter for screening for other disorder: Secondary | ICD-10-CM | POA: Diagnosis not present

## 2015-06-02 DIAGNOSIS — I10 Essential (primary) hypertension: Secondary | ICD-10-CM | POA: Diagnosis not present

## 2015-06-03 ENCOUNTER — Telehealth: Payer: Self-pay | Admitting: *Deleted

## 2015-06-03 ENCOUNTER — Ambulatory Visit (INDEPENDENT_AMBULATORY_CARE_PROVIDER_SITE_OTHER): Payer: Medicare Other | Admitting: Cardiology

## 2015-06-03 ENCOUNTER — Encounter: Payer: Self-pay | Admitting: Cardiology

## 2015-06-03 ENCOUNTER — Other Ambulatory Visit: Payer: Self-pay

## 2015-06-03 ENCOUNTER — Ambulatory Visit (INDEPENDENT_AMBULATORY_CARE_PROVIDER_SITE_OTHER): Payer: Medicare Other

## 2015-06-03 VITALS — BP 150/98 | HR 74 | Ht 66.0 in | Wt 256.0 lb

## 2015-06-03 DIAGNOSIS — I429 Cardiomyopathy, unspecified: Secondary | ICD-10-CM | POA: Diagnosis not present

## 2015-06-03 DIAGNOSIS — I10 Essential (primary) hypertension: Secondary | ICD-10-CM | POA: Diagnosis not present

## 2015-06-03 MED ORDER — SPIRONOLACTONE 25 MG PO TABS: 25.0000 mg | ORAL_TABLET | Freq: Every day | ORAL | Status: DC

## 2015-06-03 MED ORDER — FUROSEMIDE 20 MG PO TABS: 20.0000 mg | ORAL_TABLET | Freq: Every day | ORAL | Status: DC

## 2015-06-03 MED ORDER — CARVEDILOL 6.25 MG PO TABS: 6.2500 mg | ORAL_TABLET | Freq: Two times a day (BID) | ORAL | Status: DC

## 2015-06-03 NOTE — Patient Instructions (Signed)

## 2015-06-03 NOTE — Telephone Encounter (Signed)
-----   Message from Satira Sark, MD sent at 06/03/2015  3:03 PM EDT ----- Reviewed. There has been decrease in LVEF since last study. Needs to get back constently on her medications. Will will reassess later.

## 2015-06-03 NOTE — Progress Notes (Signed)
Cardiology Office Note  Date: 06/03/2015   ID: Caroline Mason, DOB 1956/05/10, MRN [id number]  PCP: Curlene Labrum, MD  Primary Cardiologist: Rozann Lesches, MD   Chief Complaint  Patient presents with  . Cardiomyopathy    History of Present Illness: Caroline Mason is a 59 y.o. female last seen in February 2015. She comes back to the office having missed her last 6 month follow-up. She has been out of some of her medications, blood pressure is elevated today. She just recently saw Dr. Pleas Koch.  She reports NYHA class II dyspnea, no chest pain. We went over her medications, benazepril was added back recently by Dr. Pleas Koch. She needs refills on Coreg, Aldactone, and Lasix.  Follow-up ECG is reviewed below.  Her last echocardiogram from 2013 is also reviewed below.   Past Medical History  Diagnosis Date  . Essential hypertension, benign   . Obstructive sleep apnea   . Cardiomyopathy     Nonischemic, normal coronaries, LVEF 40-45%  . Atypical pneumonia     12/11  . Depression   . Fibromyalgia   . Rheumatoid arthritis(714.0)   . Morbid obesity   . COPD (chronic obstructive pulmonary disease)     Current Outpatient Prescriptions  Medication Sig Dispense Refill  . amLODipine (NORVASC) 10 MG tablet Take 10 mg by mouth daily.    . carvedilol (COREG) 6.25 MG tablet Take 1 tablet (6.25 mg total) by mouth 2 (two) times daily. 60 tablet 6  . Chlorpheniramine-PSE-Ibuprofen (ADVIL ALLERGY SINUS) 2-30-200 MG TABS Take 1 tablet by mouth daily as needed.      . docusate sodium (COLACE) 100 MG capsule Take 100 mg by mouth as needed.    Marland Kitchen escitalopram (LEXAPRO) 20 MG tablet Take 20 mg by mouth daily.      . fluticasone (FLONASE) 50 MCG/ACT nasal spray Place 2 sprays into the nose as needed.     . furosemide (LASIX) 20 MG tablet Take 1 tablet (20 mg total) by mouth daily. 30 tablet 6  . guaiFENesin-dextromethorphan (ROBITUSSIN DM) 100-10 MG/5ML syrup Take 5 mLs by mouth 3  (three) times daily as needed.      . naproxen (NAPROSYN) 500 MG tablet Take 500 mg by mouth 2 (two) times daily with a meal. If needed for pain    . sodium chloride (OCEAN) 0.65 % nasal spray Place 1 spray into the nose as needed.      Marland Kitchen spironolactone (ALDACTONE) 25 MG tablet Take 1 tablet (25 mg total) by mouth daily. 30 tablet 6  . traMADol (ULTRAM) 50 MG tablet Take 50-100 mg by mouth 2 (two) times daily.    . benazepril (LOTENSIN) 20 MG tablet Take 1 tablet (20 mg total) by mouth daily. 30 tablet 0   No current facility-administered medications for this visit.    Allergies:  Aspirin   Social History: The patient  reports that she has been smoking Cigarettes.  She has a 9 pack-year smoking history. She has never used smokeless tobacco. She reports that she does not drink alcohol or use illicit drugs.   ROS:  Please see the history of present illness. Otherwise, complete review of systems is positive for left hand trigger finger and arthritic pains.  All other systems are reviewed and negative.   Physical Exam: VS:  BP 150/98 mmHg  Pulse 74  Ht 5\' 6"  (1.676 m)  Wt 256 lb (116.121 kg)  BMI 41.34 kg/m2  SpO2 94%, BMI Body mass index is 41.34  kg/(m^2).  Wt Readings from Last 3 Encounters:  06/03/15 256 lb (116.121 kg)  02/13/14 245 lb (111.131 kg)  05/15/13 245 lb (111.131 kg)     Overweight woman in no acute distress.  HEENT: Conjunctiva and lids normal, oropharynx with moist mucosa.  Neck: Supple, no elevated JVP or carotid bruits, no thyromegaly.  Lungs: Clear to auscultation, nonlabored.  Cardiac: Regular rate and rhythm, no S3 gallop.  Abdomen: Soft, nontender, bowel sounds present.  Skin: Warm and dry.  Extremities: No pitting edema, distal pulses full.    ECG: ECG is ordered today and shows sinus rhythm with left axis deviation, decreased R wave progression, nonspecific T-wave changes.   Other Studies Reviewed Today:  Echocardiogram 07/31/2012: Study  Conclusions  - Left ventricle: The cavity size was normal. There was mild concentric hypertrophy. Systolic function was mildly reduced. The estimated ejection fraction was in the range of 45% to 50%. Diffuse hypokinesis. Doppler parameters are consistent with abnormal left ventricular relaxation (grade 1 diastolic dysfunction). - Aortic valve: Valve area: 1.45cm^2(VTI). Valve area: 1.47cm^2 (Vmax). - Left atrium: The atrium was mildly dilated. - Atrial septum: No defect or patent foramen ovale was identified.  Assessment and Plan:  1. History of nonischemic cardiomyopathy, LVEF was 45-50% as of 2013. We refilled her cardiac medications as outlined above, and will plan a follow-up echocardiogram to reassess LVEF.  2. Essential hypertension, blood pressure elevated today. She has been out of some of her medications as outlined.  Current medicines were reviewed with the patient today.   Orders Placed This Encounter  Procedures  . EKG 12-Lead  . ECHOCARDIOGRAM COMPLETE    Disposition: FU with me in 6 months.   Signed, Satira Sark, MD, [facility] 06/03/2015 11:49 AM    [facility] [place], [place], [place] [address] Phone: [phone]; Fax: [phone]

## 2015-06-03 NOTE — Telephone Encounter (Signed)
Patient informed. 

## 2015-06-21 DIAGNOSIS — M25562 Pain in left knee: Secondary | ICD-10-CM | POA: Diagnosis not present

## 2015-06-21 DIAGNOSIS — M65322 Trigger finger, left index finger: Secondary | ICD-10-CM | POA: Diagnosis not present

## 2015-06-21 DIAGNOSIS — M79641 Pain in right hand: Secondary | ICD-10-CM | POA: Diagnosis not present

## 2015-06-21 DIAGNOSIS — Z09 Encounter for follow-up examination after completed treatment for conditions other than malignant neoplasm: Secondary | ICD-10-CM | POA: Diagnosis not present

## 2015-06-21 DIAGNOSIS — M0579 Rheumatoid arthritis with rheumatoid factor of multiple sites without organ or systems involvement: Secondary | ICD-10-CM | POA: Diagnosis not present

## 2015-06-24 DIAGNOSIS — I10 Essential (primary) hypertension: Secondary | ICD-10-CM | POA: Diagnosis not present

## 2015-06-24 DIAGNOSIS — E559 Vitamin D deficiency, unspecified: Secondary | ICD-10-CM | POA: Diagnosis not present

## 2015-06-24 DIAGNOSIS — M797 Fibromyalgia: Secondary | ICD-10-CM | POA: Diagnosis not present

## 2015-06-24 DIAGNOSIS — M810 Age-related osteoporosis without current pathological fracture: Secondary | ICD-10-CM | POA: Diagnosis not present

## 2015-06-24 DIAGNOSIS — Z79899 Other long term (current) drug therapy: Secondary | ICD-10-CM | POA: Diagnosis not present

## 2015-06-24 DIAGNOSIS — F329 Major depressive disorder, single episode, unspecified: Secondary | ICD-10-CM | POA: Diagnosis not present

## 2015-06-24 DIAGNOSIS — Z13820 Encounter for screening for osteoporosis: Secondary | ICD-10-CM | POA: Diagnosis not present

## 2015-06-24 DIAGNOSIS — F17200 Nicotine dependence, unspecified, uncomplicated: Secondary | ICD-10-CM | POA: Diagnosis not present

## 2015-06-24 DIAGNOSIS — Z780 Asymptomatic menopausal state: Secondary | ICD-10-CM | POA: Diagnosis not present

## 2015-07-02 DIAGNOSIS — R202 Paresthesia of skin: Secondary | ICD-10-CM | POA: Diagnosis not present

## 2015-07-05 DIAGNOSIS — Z1231 Encounter for screening mammogram for malignant neoplasm of breast: Secondary | ICD-10-CM | POA: Diagnosis not present

## 2015-07-06 ENCOUNTER — Ambulatory Visit (HOSPITAL_COMMUNITY)
Admission: RE | Admit: 2015-07-06 | Discharge: 2015-07-06 | Disposition: A | Discharge status: Home / Self Care | Payer: Medicare Other | Source: Ambulatory Visit | Attending: Interventional Radiology | Admitting: Interventional Radiology

## 2015-07-06 ENCOUNTER — Other Ambulatory Visit (HOSPITAL_COMMUNITY): Payer: Self-pay | Admitting: Interventional Radiology

## 2015-07-06 DIAGNOSIS — J449 Chronic obstructive pulmonary disease, unspecified: Secondary | ICD-10-CM | POA: Diagnosis not present

## 2015-07-06 DIAGNOSIS — I429 Cardiomyopathy, unspecified: Secondary | ICD-10-CM | POA: Insufficient documentation

## 2015-07-06 DIAGNOSIS — Z9225 Personal history of immunosupression therapy: Secondary | ICD-10-CM | POA: Insufficient documentation

## 2015-07-06 DIAGNOSIS — Z87891 Personal history of nicotine dependence: Secondary | ICD-10-CM | POA: Diagnosis not present

## 2015-07-19 DIAGNOSIS — G5601 Carpal tunnel syndrome, right upper limb: Secondary | ICD-10-CM | POA: Diagnosis not present

## 2015-07-19 DIAGNOSIS — M6530 Trigger finger, unspecified finger: Secondary | ICD-10-CM | POA: Diagnosis not present

## 2015-07-22 DIAGNOSIS — Z79899 Other long term (current) drug therapy: Secondary | ICD-10-CM | POA: Diagnosis not present

## 2015-07-29 DIAGNOSIS — G5601 Carpal tunnel syndrome, right upper limb: Secondary | ICD-10-CM | POA: Diagnosis not present

## 2015-09-03 DIAGNOSIS — I429 Cardiomyopathy, unspecified: Secondary | ICD-10-CM | POA: Diagnosis not present

## 2015-09-03 DIAGNOSIS — I10 Essential (primary) hypertension: Secondary | ICD-10-CM | POA: Diagnosis not present

## 2015-09-03 DIAGNOSIS — F328 Other depressive episodes: Secondary | ICD-10-CM | POA: Diagnosis not present

## 2015-09-03 DIAGNOSIS — I5022 Chronic systolic (congestive) heart failure: Secondary | ICD-10-CM | POA: Diagnosis not present

## 2015-09-03 DIAGNOSIS — F419 Anxiety disorder, unspecified: Secondary | ICD-10-CM | POA: Diagnosis not present

## 2015-09-17 DIAGNOSIS — Z79899 Other long term (current) drug therapy: Secondary | ICD-10-CM | POA: Diagnosis not present

## 2015-10-09 IMAGING — DX DG CHEST 2V
2 series · 2 of 2 positions shown · non-contrast
Comparison: Chest x-ray dated August 29, 2014

CLINICAL DATA: Routine checkup, history of COPD, cardiomyopathy,
immunosuppression, arthritis, current smoker.

EXAM:
CHEST  2 VIEW

[chest pa]
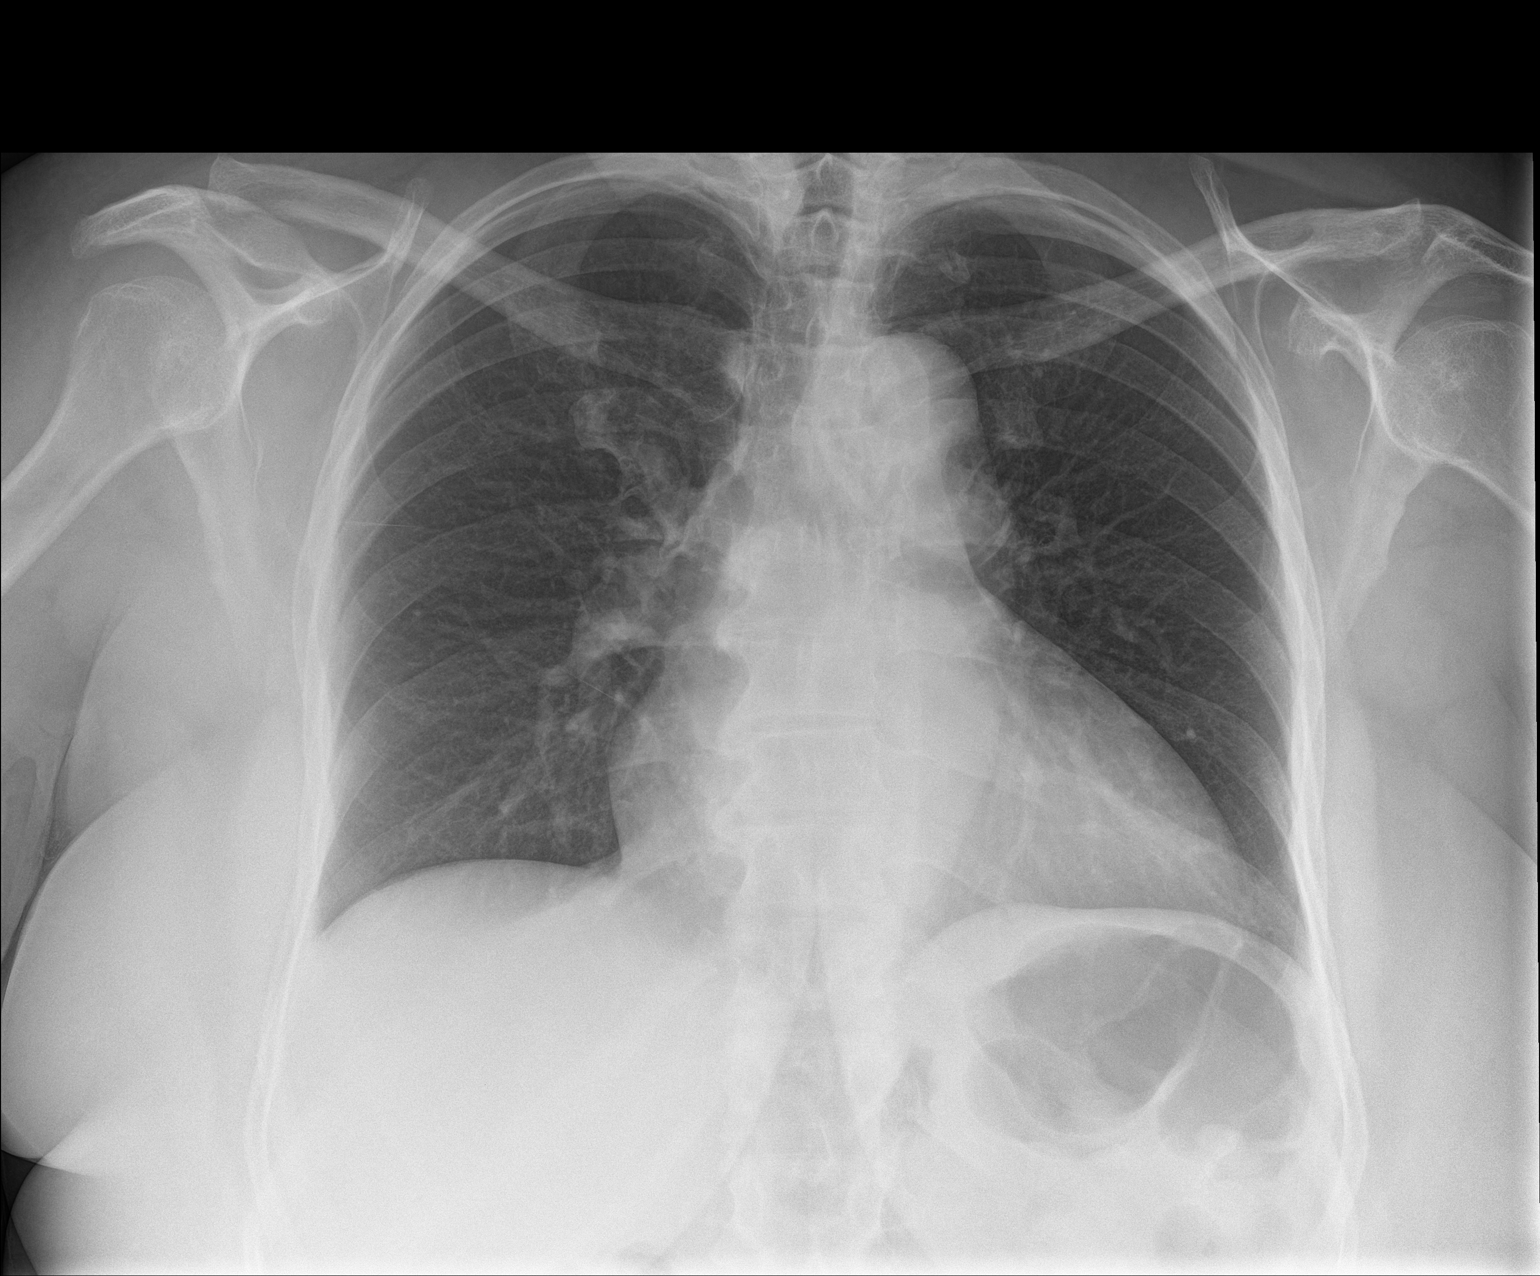

[chest lat]
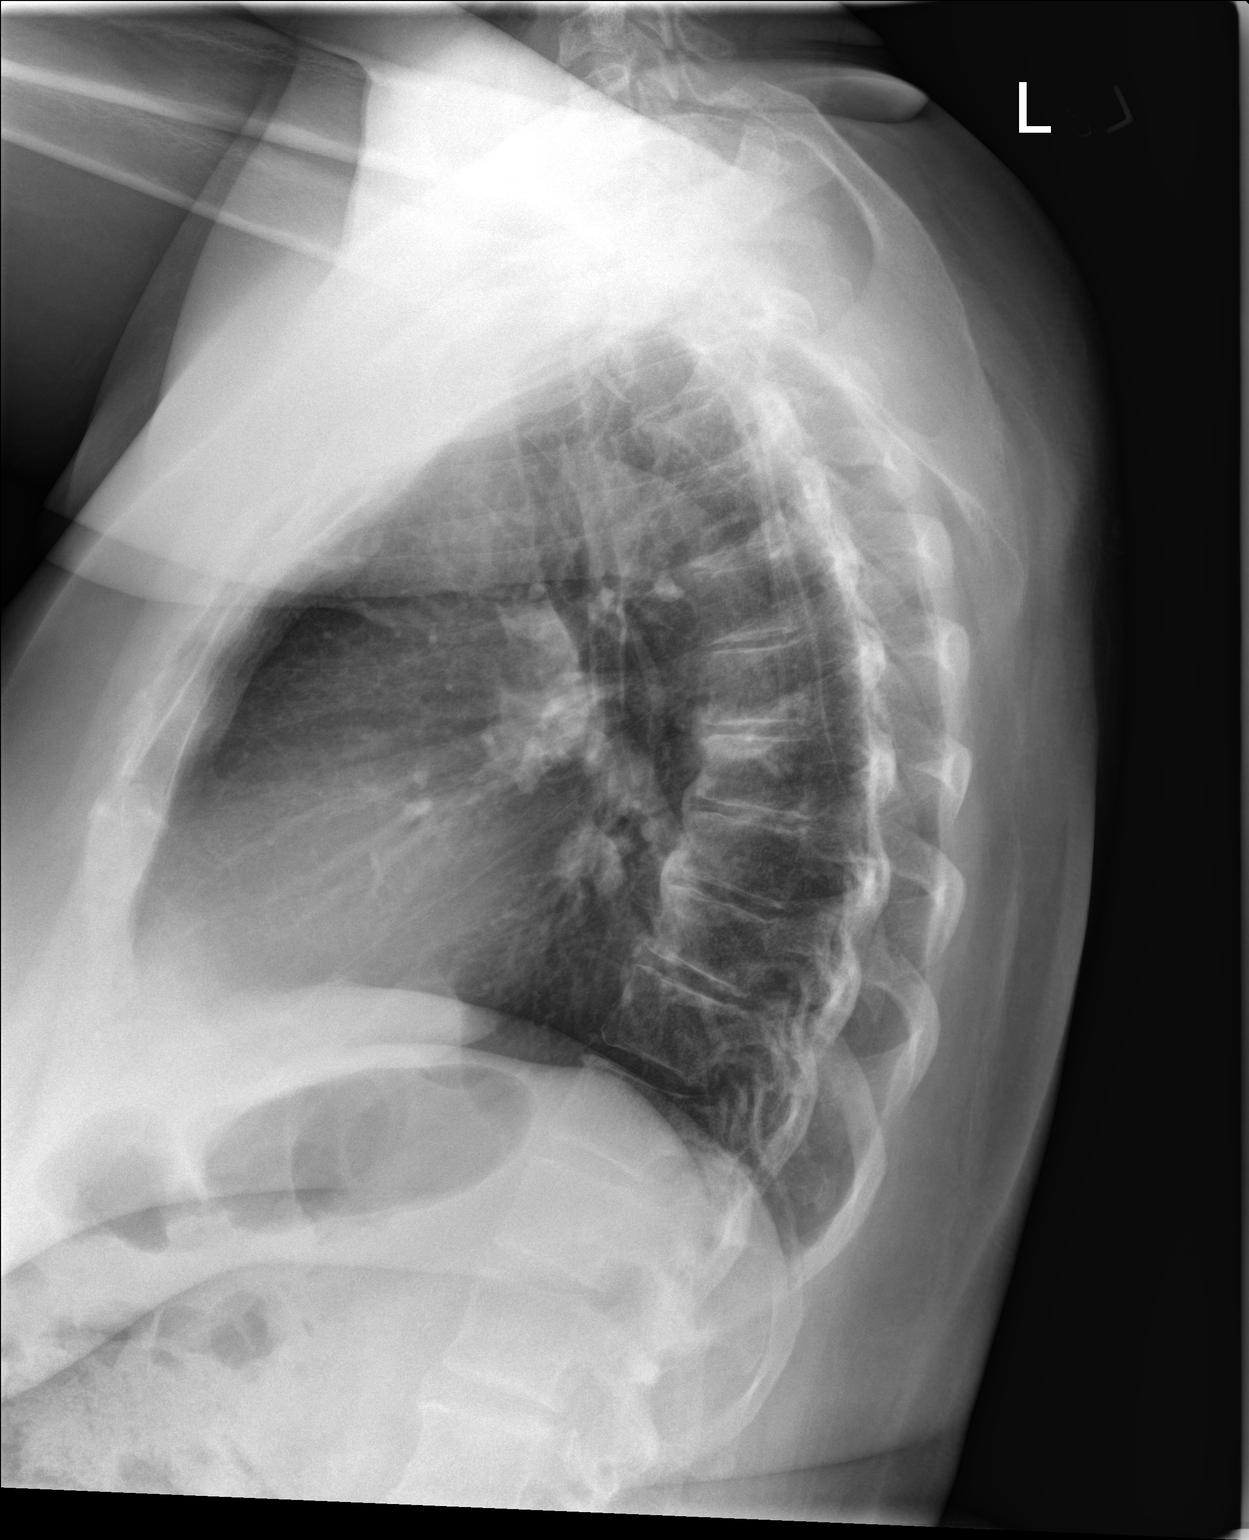

[2 of 2 positions shown; findings below may reference images not displayed]

FINDINGS: The lungs are adequately inflated and clear. The cardiac silhouette
is enlarged and stable. The pulmonary vascularity is normal. The
mediastinum is normal in width. There is mild tortuosity of the
descending thoracic aorta. There is multilevel degenerative disc
disease of the thoracic spine.
IMPRESSION: Stable enlargement of the cardiac silhouette. There is no pulmonary
edema nor other acute cardiopulmonary abnormality.

## 2015-12-02 DIAGNOSIS — E782 Mixed hyperlipidemia: Secondary | ICD-10-CM | POA: Diagnosis not present

## 2015-12-02 DIAGNOSIS — F419 Anxiety disorder, unspecified: Secondary | ICD-10-CM | POA: Diagnosis not present

## 2015-12-02 DIAGNOSIS — E559 Vitamin D deficiency, unspecified: Secondary | ICD-10-CM | POA: Diagnosis not present

## 2015-12-02 DIAGNOSIS — I10 Essential (primary) hypertension: Secondary | ICD-10-CM | POA: Diagnosis not present

## 2015-12-09 DIAGNOSIS — I10 Essential (primary) hypertension: Secondary | ICD-10-CM | POA: Diagnosis not present

## 2015-12-09 DIAGNOSIS — F17210 Nicotine dependence, cigarettes, uncomplicated: Secondary | ICD-10-CM | POA: Diagnosis not present

## 2015-12-09 DIAGNOSIS — D509 Iron deficiency anemia, unspecified: Secondary | ICD-10-CM | POA: Diagnosis not present

## 2015-12-09 DIAGNOSIS — E559 Vitamin D deficiency, unspecified: Secondary | ICD-10-CM | POA: Diagnosis not present

## 2015-12-09 DIAGNOSIS — E782 Mixed hyperlipidemia: Secondary | ICD-10-CM | POA: Diagnosis not present

## 2016-01-07 DIAGNOSIS — J209 Acute bronchitis, unspecified: Secondary | ICD-10-CM | POA: Diagnosis not present

## 2016-01-07 DIAGNOSIS — R05 Cough: Secondary | ICD-10-CM | POA: Diagnosis not present

## 2016-02-02 ENCOUNTER — Encounter: Payer: Self-pay | Admitting: *Deleted

## 2016-02-02 ENCOUNTER — Other Ambulatory Visit: Payer: Self-pay | Admitting: *Deleted

## 2016-02-02 MED ORDER — SPIRONOLACTONE 25 MG PO TABS: 25.0000 mg | ORAL_TABLET | Freq: Every day | ORAL | Status: DC

## 2016-02-03 ENCOUNTER — Encounter: Payer: Self-pay | Admitting: Cardiology

## 2016-02-03 ENCOUNTER — Ambulatory Visit (INDEPENDENT_AMBULATORY_CARE_PROVIDER_SITE_OTHER): Payer: Medicare Other | Admitting: Cardiology

## 2016-02-03 VITALS — BP 132/81 | HR 67 | Ht 66.0 in | Wt 250.6 lb

## 2016-02-03 DIAGNOSIS — I429 Cardiomyopathy, unspecified: Secondary | ICD-10-CM

## 2016-02-03 DIAGNOSIS — I10 Essential (primary) hypertension: Secondary | ICD-10-CM | POA: Diagnosis not present

## 2016-02-03 MED ORDER — SPIRONOLACTONE 25 MG PO TABS: 25.0000 mg | ORAL_TABLET | Freq: Every day | ORAL | Status: DC

## 2016-02-03 NOTE — Progress Notes (Signed)
Cardiology Office Note  Date: 02/03/2016   ID: SICLALY HANISCH, DOB [birth year], MRN [id number]  PCP: Curlene Labrum, MD  Primary Cardiologist: Rozann Lesches, MD   Chief Complaint  Patient presents with  . Cardiomyopathy    History of Present Illness: Caroline Mason is a 59 y.o. female last seen in June 2016. She presents for a follow-up visit. Since last encounter she reports compliance with her medications. She has had NYHA class II dyspnea, no exertional chest pain or palpitations.  She did have a follow-up echocardiogram since last encounter demonstrating reduction in LVEF to the range of 35-40% as reviewed below. This was in the setting of medication noncompliance.  We discussed her medications. Cardiac regimen now includes Coreg, Lotensin, Norvasc, Lasix, and Aldactone.  I talked with her today about diet and weight loss, also getting back to a regular walking regimen for exercise.  Past Medical History  Diagnosis Date  . Essential hypertension, benign   . Obstructive sleep apnea   . Cardiomyopathy     Nonischemic, normal coronaries, LVEF 40-45%  . Atypical pneumonia     12/11  . Depression   . Fibromyalgia   . Rheumatoid arthritis(714.0)   . Morbid obesity ([place])   . COPD (chronic obstructive pulmonary disease) ([place])     Current Outpatient Prescriptions  Medication Sig Dispense Refill  . amLODipine (NORVASC) 10 MG tablet Take 10 mg by mouth daily.    Marland Kitchen atorvastatin (LIPITOR) 10 MG tablet Take 10 mg by mouth daily.    . benazepril (LOTENSIN) 20 MG tablet Take 1 tablet (20 mg total) by mouth daily. 30 tablet 0  . carvedilol (COREG) 6.25 MG tablet Take 1 tablet (6.25 mg total) by mouth 2 (two) times daily. 60 tablet 6  . Chlorpheniramine-PSE-Ibuprofen (ADVIL ALLERGY SINUS) 2-30-200 MG TABS Take 1 tablet by mouth daily as needed.      . cyclobenzaprine (FLEXERIL) 10 MG tablet Take 10 mg by mouth at bedtime as needed for muscle spasms.    Marland Kitchen docusate sodium  (COLACE) 100 MG capsule Take 100 mg by mouth as needed.    . ferrous sulfate 325 (65 FE) MG tablet Take 325 mg by mouth daily.    . fluticasone (FLONASE) 50 MCG/ACT nasal spray Place 2 sprays into the nose as needed.     . folic acid (FOLVITE) 1 MG tablet Take 2 mg by mouth every morning.    . furosemide (LASIX) 20 MG tablet Take 1 tablet (20 mg total) by mouth daily. 30 tablet 6  . gabapentin (NEURONTIN) 300 MG capsule Take 300 mg by mouth 2 (two) times daily.    Marland Kitchen guaiFENesin-dextromethorphan (ROBITUSSIN DM) 100-10 MG/5ML syrup Take 5 mLs by mouth 3 (three) times daily as needed.      . hydroxychloroquine (PLAQUENIL) 200 MG tablet Take 200 mg by mouth 2 (two) times daily.    . naproxen (NAPROSYN) 500 MG tablet Take 500 mg by mouth 2 (two) times daily with a meal. If needed for pain    . sertraline (ZOLOFT) 100 MG tablet Take 100 mg by mouth daily.    . sodium chloride (OCEAN) 0.65 % nasal spray Place 1 spray into the nose as needed.      Marland Kitchen spironolactone (ALDACTONE) 25 MG tablet Take 1 tablet (25 mg total) by mouth daily. 30 tablet 6  . VITAMIN D, ERGOCALCIFEROL, PO Take 1 tablet by mouth daily.     No current facility-administered medications for this visit.  Allergies:  Aspirin   Social History: The patient  reports that she has been smoking Cigarettes.  She has a 9 pack-year smoking history. She has never used smokeless tobacco. She reports that she does not drink alcohol or use illicit drugs.   ROS:  Please see the history of present illness. Otherwise, complete review of systems is positive for mild chest congestion.  All other systems are reviewed and negative.   Physical Exam: VS:  BP 132/81 mmHg  Pulse 67  Ht 5\' 6"  (1.676 m)  Wt 250 lb 9.6 oz (113.671 kg)  BMI 40.47 kg/m2  SpO2 97%, BMI Body mass index is 40.47 kg/(m^2).  Wt Readings from Last 3 Encounters:  02/03/16 250 lb 9.6 oz (113.671 kg)  06/03/15 256 lb (116.121 kg)  02/13/14 245 lb (111.131 kg)    Overweight  woman in no acute distress.  HEENT: Conjunctiva and lids normal, oropharynx with moist mucosa.  Neck: Supple, no elevated JVP or carotid bruits, no thyromegaly.  Lungs: Clear to auscultation, nonlabored.  Cardiac: Regular rate and rhythm, no S3 gallop.  Abdomen: Soft, nontender, bowel sounds present.  Skin: Warm and dry.  Extremities: No pitting edema, distal pulses full.   ECG: I personally reviewed her tracing from 06/03/2015 which showed normal sinus rhythm with leftward axis and poor R-wave progression.  Recent Labwork:  December 2016: Cholesterol 167, triglycerides 104, HDL 56, LDL 90, BUN 7, creatinine 0.6, potassium 4.3, AST 12, ALT 10  Other Studies Reviewed Today:  Echocardiogram 06/03/2015: Study Conclusions  - Left ventricle: The cavity size was normal. Wall thickness was at the upper limits of normal. Systolic function was moderately reduced. The estimated ejection fraction was in the range of 35% to 40%. Diffuse hypokinesis. Doppler parameters are consistent with abnormal left ventricular relaxation (grade 1 diastolic dysfunction). - Aortic valve: Mildly calcified annulus. Trileaflet. - Mitral valve: There was trivial regurgitation. - Left atrium: The atrium was mildly dilated. - Atrial septum: No defect or patent foramen ovale was identified. - Tricuspid valve: There was trivial regurgitation. - Pulmonary arteries: PA peak pressure: 21 mm Hg (S). - Pericardium, extracardiac: There was no pericardial effusion.  Impressions:  - Upper normal LV wall thickness with LVEF 35-40%, diffuse hypokinesis. Grade 1 diastolic dysfunction. Trivial mitral regurgitation. PASP 21 mmHg.  Assessment and Plan:  1. Nonischemic cardiomyopathy, most recent LVEF 35-40%. She is back on regular medical therapy at this time and is clinically stable. We discussed diet and exercise plan. Follow-up arranged with repeat echocardiogram in the next 6 months.  2. History of  normal coronary arteries by prior cardiac catheterization.  3. Essential hypertension, blood pressure is better controlled today.  Current medicines were reviewed with the patient today.   Orders Placed This Encounter  Procedures  . ECHOCARDIOGRAM COMPLETE    Disposition: FU with me in 6 months.   Signed, Satira Sark, MD, [facility] 02/03/2016 1:55 PM    [facility] [place], [place], [place] [address] Phone: [phone]; Fax: [phone]

## 2016-02-03 NOTE — Patient Instructions (Signed)
Your physician recommends that you continue on your current medications as directed. Please refer to the Current Medication list given to you today. Your physician has requested that you have an echocardiogram in 6 months just before your next visit. Echocardiography is a painless test that uses sound waves to create images of your heart. It provides your doctor with information about the size and shape of your heart and how well your heart's chambers and valves are working. This procedure takes approximately one hour. There are no restrictions for this procedure. Your physician recommends that you schedule a follow-up appointment in: 6 months. You will receive a reminder letter in the mail in about 4 months reminding you to call and schedule your appointment. If you don't receive this letter, please contact our office. 

## 2016-02-04 DIAGNOSIS — M19241 Secondary osteoarthritis, right hand: Secondary | ICD-10-CM | POA: Diagnosis not present

## 2016-02-04 DIAGNOSIS — Z09 Encounter for follow-up examination after completed treatment for conditions other than malignant neoplasm: Secondary | ICD-10-CM | POA: Diagnosis not present

## 2016-02-04 DIAGNOSIS — M0579 Rheumatoid arthritis with rheumatoid factor of multiple sites without organ or systems involvement: Secondary | ICD-10-CM | POA: Diagnosis not present

## 2016-02-04 DIAGNOSIS — M170 Bilateral primary osteoarthritis of knee: Secondary | ICD-10-CM | POA: Diagnosis not present

## 2016-02-09 DIAGNOSIS — Z79899 Other long term (current) drug therapy: Secondary | ICD-10-CM | POA: Diagnosis not present

## 2016-02-18 ENCOUNTER — Telehealth: Payer: Self-pay | Admitting: Cardiology

## 2016-02-18 NOTE — Telephone Encounter (Signed)
Patient would like to know if Dr signed her paperwork for taxes

## 2016-02-18 NOTE — Telephone Encounter (Signed)
Per Dr. Domenic Polite: Her current cardiac situation does not make her permanently disabled. I have actually never seen this type of form before.   Pt aware and unsatisfied. Pt will come by office to pick up this form. Form on [employer]

## 2016-02-28 ENCOUNTER — Other Ambulatory Visit: Payer: Self-pay | Admitting: *Deleted

## 2016-02-28 DIAGNOSIS — E782 Mixed hyperlipidemia: Secondary | ICD-10-CM | POA: Diagnosis not present

## 2016-02-28 DIAGNOSIS — D509 Iron deficiency anemia, unspecified: Secondary | ICD-10-CM | POA: Diagnosis not present

## 2016-02-28 DIAGNOSIS — I10 Essential (primary) hypertension: Secondary | ICD-10-CM | POA: Diagnosis not present

## 2016-02-28 DIAGNOSIS — M0580 Other rheumatoid arthritis with rheumatoid factor of unspecified site: Secondary | ICD-10-CM | POA: Diagnosis not present

## 2016-02-28 DIAGNOSIS — R7301 Impaired fasting glucose: Secondary | ICD-10-CM | POA: Diagnosis not present

## 2016-02-28 MED ORDER — CARVEDILOL 6.25 MG PO TABS: 6.2500 mg | ORAL_TABLET | Freq: Two times a day (BID) | ORAL | Status: DC

## 2016-03-23 DIAGNOSIS — E782 Mixed hyperlipidemia: Secondary | ICD-10-CM | POA: Diagnosis not present

## 2016-03-23 DIAGNOSIS — D509 Iron deficiency anemia, unspecified: Secondary | ICD-10-CM | POA: Diagnosis not present

## 2016-03-23 DIAGNOSIS — G4733 Obstructive sleep apnea (adult) (pediatric): Secondary | ICD-10-CM | POA: Diagnosis not present

## 2016-03-30 ENCOUNTER — Encounter: Payer: Medicare Other | Attending: Family Medicine | Admitting: Nutrition

## 2016-03-30 ENCOUNTER — Encounter: Payer: Self-pay | Admitting: Nutrition

## 2016-03-30 VITALS — Ht 66.0 in | Wt 253.8 lb

## 2016-03-30 DIAGNOSIS — E7800 Pure hypercholesterolemia, unspecified: Secondary | ICD-10-CM | POA: Insufficient documentation

## 2016-03-30 DIAGNOSIS — I5030 Unspecified diastolic (congestive) heart failure: Secondary | ICD-10-CM | POA: Diagnosis not present

## 2016-03-30 DIAGNOSIS — R739 Hyperglycemia, unspecified: Secondary | ICD-10-CM

## 2016-03-30 DIAGNOSIS — Z713 Dietary counseling and surveillance: Secondary | ICD-10-CM | POA: Insufficient documentation

## 2016-03-30 DIAGNOSIS — R7303 Prediabetes: Secondary | ICD-10-CM | POA: Insufficient documentation

## 2016-03-30 DIAGNOSIS — I10 Essential (primary) hypertension: Secondary | ICD-10-CM | POA: Insufficient documentation

## 2016-03-30 NOTE — Progress Notes (Signed)
  Medical Nutrition Therapy:  Appt start time: [id number] end time:  1430.  Assessment:  Primary concerns today: Prediabetes and obesity. A1C 5.8% She cooks and shops. Doesn't eat out much. Going thru memopause and feels she has gained 12 lbs in the last 6 months. Eats 2 meals per day. Physical actvity:gardening and walking her dog. Eats late at night and between meals. Doesn't have much energy. Has issues with depression and on Zoloft. Diet is excessive in calories, fat, sodium and low in fresh fruits and vegetable. Drinks liquids calories and eats simple sugars. BMI >40. Morbid obesity.  Preferred Learning Style:   No preference indicated   Learning Readiness:  Ready  Change in progress   MEDICATIONS: See lis   DIETARY INTAKE:    24-hr recall:  B ( AM): Cereal sugared with milk, or toast 2 slices and coffee,  Snk ( AM): none  L ( PM): waffle, 1 sausage and 1 tbsp hashbrown and 2 cups of cofffee   Snk ( PM):  D ( PM): CHicken leg-fried. Koolaid Snk ( PM): 2 cups coffee Beverages: water, coffee and soda and tea  Usual physical activity: gardening a  Estimated energy needs: 1500 calories 170 g carbohydrates 112 g protein 42 g fat  Progress Towards Goal(s):  In progress.   Nutritional Diagnosis:  NB-1.1 Food and nutrition-related knowledge deficit As related to Prediabetes and morbid obesity.  As evidenced by A1CD 5.8 and BMI > 40..    Intervention:  Nutrition and Diabetes education provided on My Plate, CHO counting, meal planning, portion sizes, timing of meals, avoiding snacks between meals unless having a low blood sugar, target ranges for A1C and blood sugars, signs/symptoms and treatment of hyper/hypoglycemia, monitoring blood sugars, taking medications as prescribed, benefits of exercising 30 minutes per day and prevention of complications of DM.   Goals 1. Follow Plate Method 2. Don't skip meals 3. Eat meals on time. 4. Increase fresh fruits and vegetables. 5. Drink  5 bottles of water 6. Exercise 30 mins per day. 7. Lose 1 lb per week.  Teaching Method Utilized: Visual Auditory Hands on  Handouts given during visit include:  The Plate Method  Meal Plan Card  Diabetes Instructions.   Barriers to learning/adherence to lifestyle change:  None  Demonstrated degree of understanding via:  Teach Back   Monitoring/Evaluation:  Dietary intake, exercise, meal planning  and body weight in 1 month.

## 2016-03-30 NOTE — Patient Instructions (Signed)
Goals 1. Follow Plate Method 2. Don't skip meals 3. Eat meals on time. 4. Increase fresh fruits and vegetables. 5. Drink 5 bottles of water 6. Exercise 30 mins per day. 7. Lose 1 lb per week.

## 2016-04-03 DIAGNOSIS — R05 Cough: Secondary | ICD-10-CM | POA: Diagnosis not present

## 2016-04-03 DIAGNOSIS — J9801 Acute bronchospasm: Secondary | ICD-10-CM | POA: Diagnosis not present

## 2016-04-25 ENCOUNTER — Encounter: Payer: Self-pay | Admitting: Nutrition

## 2016-05-08 ENCOUNTER — Encounter: Payer: Self-pay | Admitting: Nutrition

## 2016-05-08 ENCOUNTER — Encounter: Payer: Medicare Other | Attending: Family Medicine | Admitting: Nutrition

## 2016-05-08 VITALS — Ht 65.0 in | Wt 244.0 lb

## 2016-05-08 DIAGNOSIS — E7800 Pure hypercholesterolemia, unspecified: Secondary | ICD-10-CM | POA: Insufficient documentation

## 2016-05-08 DIAGNOSIS — R7303 Prediabetes: Secondary | ICD-10-CM | POA: Diagnosis not present

## 2016-05-08 DIAGNOSIS — Z6841 Body Mass Index (BMI) 40.0 and over, adult: Secondary | ICD-10-CM | POA: Insufficient documentation

## 2016-05-08 DIAGNOSIS — Z713 Dietary counseling and surveillance: Secondary | ICD-10-CM | POA: Diagnosis not present

## 2016-05-08 DIAGNOSIS — R739 Hyperglycemia, unspecified: Secondary | ICD-10-CM

## 2016-05-08 DIAGNOSIS — I5022 Chronic systolic (congestive) heart failure: Secondary | ICD-10-CM | POA: Diagnosis not present

## 2016-05-08 DIAGNOSIS — I10 Essential (primary) hypertension: Secondary | ICD-10-CM | POA: Diagnosis not present

## 2016-05-08 NOTE — Patient Instructions (Signed)
Goals 1. Follow Plate Method 2. Cut back on coffee and sweetner  3. Eat meals on time. 4. Increase fresh fruits and vegetables. 5. Continue to increase water 6. Exercise 30 mins per day. 7. Lose 1 lb per week. 8. Cut out snacks unless fruit or veggies.

## 2016-05-08 NOTE — Progress Notes (Signed)
    Medical Nutrition Therapy:  Appt start time: 1330 end time:  1400.  Assessment:  Primary concerns today: Prediabetes and obesity.  Lost 9 lbs. Eating more fresh fruits and vegetables. Eating  3 meals per day on time. Walking and drinking a lot water.  Has stopped eating sweets and junk food. Is more mindful about portion sizes. Likes to dance for exercise. Feels better. Avoids salty and fatty foods. Hasn't gotten A1C done again yet. Diet is much improved with less processed foods, more fresh fruits and vegetables and whole grains.  Preferred Learning Style:   No preference indicated   Learning Readiness:  Ready  Change in progress   MEDICATIONS: See lis   DIETARY INTAKE:    24-hr recall:  B ( AM): Oatmeal, egg,  Or cheerios and 1% milk,  Snk ( AM): none  L ( PM): turnip greens, mashed potatoes, meatloat , water,  Strawberries. Snk ( PM):  D ( PM): Hamburger patty,  And pinto beans, biscuit, water Snk ( PM): oatmeal cookie, coffee Beverages: water, Usual physical activity: gardening and walking, dancing.   Estimated energy needs: 1500 calories 170 g carbohydrates 112 g protein 42 g fat  Progress Towards Goal(s):  In progress.   Nutritional Diagnosis:  NB-1.1 Food and nutrition-related knowledge deficit As related to Prediabetes and morbid obesity.  As evidenced by A1CD 5.8 and BMI > 40..    Intervention:  Nutrition and Diabetes education provided on My Plate, CHO counting, meal planning, portion sizes, timing of meals, avoiding snacks between meals unless having a low blood sugar, target ranges for A1C and blood sugars, signs/symptoms and treatment of hyper/hypoglycemia, monitoring blood sugars, taking medications as prescribed, benefits of exercising 30 minutes per day and prevention of complications of DM.   Goals 1. Follow Plate Method 2. Cut back on coffee and sweetner  3. Eat meals on time. 4. Increase fresh fruits and vegetables. 5. Continue to increase  water 6. Exercise 30 mins per day. 7. Lose 1 lb per week. 8. Cut out snacks unless fruit or veggies.  Teaching Method Utilized: Visual Auditory Hands on  Handouts given during visit include:  The Plate Method  Meal Plan Card  Diabetes Instructions.   Barriers to learning/adherence to lifestyle change:  None  Demonstrated degree of understanding via:  Teach Back   Monitoring/Evaluation:  Dietary intake, exercise, meal planning  and body weight in 3 month.

## 2016-06-15 DIAGNOSIS — Z79899 Other long term (current) drug therapy: Secondary | ICD-10-CM | POA: Diagnosis not present

## 2016-06-16 ENCOUNTER — Other Ambulatory Visit: Payer: Self-pay | Admitting: *Deleted

## 2016-06-16 MED ORDER — FUROSEMIDE 20 MG PO TABS: 20.0000 mg | ORAL_TABLET | Freq: Every day | ORAL | Status: DC

## 2016-07-04 DIAGNOSIS — R5381 Other malaise: Secondary | ICD-10-CM | POA: Diagnosis not present

## 2016-07-04 DIAGNOSIS — M0579 Rheumatoid arthritis with rheumatoid factor of multiple sites without organ or systems involvement: Secondary | ICD-10-CM | POA: Diagnosis not present

## 2016-07-04 DIAGNOSIS — Z09 Encounter for follow-up examination after completed treatment for conditions other than malignant neoplasm: Secondary | ICD-10-CM | POA: Diagnosis not present

## 2016-07-04 DIAGNOSIS — M797 Fibromyalgia: Secondary | ICD-10-CM | POA: Diagnosis not present

## 2016-07-13 DIAGNOSIS — H2513 Age-related nuclear cataract, bilateral: Secondary | ICD-10-CM | POA: Diagnosis not present

## 2016-07-13 DIAGNOSIS — Z09 Encounter for follow-up examination after completed treatment for conditions other than malignant neoplasm: Secondary | ICD-10-CM | POA: Diagnosis not present

## 2016-07-13 DIAGNOSIS — Z79899 Other long term (current) drug therapy: Secondary | ICD-10-CM | POA: Diagnosis not present

## 2016-07-13 DIAGNOSIS — Z049 Encounter for examination and observation for unspecified reason: Secondary | ICD-10-CM | POA: Diagnosis not present

## 2016-07-13 DIAGNOSIS — H04123 Dry eye syndrome of bilateral lacrimal glands: Secondary | ICD-10-CM | POA: Diagnosis not present

## 2016-07-26 DIAGNOSIS — M65321 Trigger finger, right index finger: Secondary | ICD-10-CM | POA: Diagnosis not present

## 2016-07-27 ENCOUNTER — Other Ambulatory Visit: Payer: Self-pay

## 2016-08-14 ENCOUNTER — Ambulatory Visit: Payer: Self-pay | Admitting: Nutrition

## 2016-08-18 ENCOUNTER — Encounter: Payer: Medicare Other | Attending: Family Medicine | Admitting: Nutrition

## 2016-08-18 DIAGNOSIS — R739 Hyperglycemia, unspecified: Secondary | ICD-10-CM

## 2016-08-18 NOTE — Progress Notes (Addendum)
    Medical Nutrition Therapy:  Appt start time: 0830 end time:  0900 Assessment:  Primary concerns today: Prediabetes and obesity. She feels she has slipped back into some old habits. Hasn't been as active and she wanted to be due to fibromyalgia and arthritis. Been drinking more water. Skipping breakfast some and eating some processed foods. No recent A1C done since last visit.  Lost 4 lbs which surprised her.  Diet is low in fresh fruits, vegetables and whole grains.   Preferred Learning Style:   No preference indicated   Learning Readiness:  Ready  Change in progress   MEDICATIONS: See lis   DIETARY INTAKE:    24-hr recall:  B ( AM): Egg, toast, and sometiimes skips   Snk ( AM): none  L ( PM): 1 slice  pizza-pepperoni, 2 cups coffee, water Snk ( PM):  D ( PM): [place] sliced, grapes, Water, 2 cups coffee Beverages: water, Usual physical activity: gardening and walking, dancing.   Estimated energy needs: 1500 calories 170 g carbohydrates 112 g protein 42 g fat  Progress Towards Goal(s):  In progress.   Nutritional Diagnosis:  NB-1.1 Food and nutrition-related knowledge deficit As related to Prediabetes and morbid obesity.  As evidenced by A1CD 5.8 and BMI > 40..    Intervention:  Nutrition and Diabetes education provided on My Plate, CHO counting, meal planning, portion sizes, timing of meals, avoiding snacks between meals unless having a low blood sugar, target ranges for A1C and blood sugars, signs/symptoms and treatment of hyper/hypoglycemia, monitoring blood sugars, taking medications as prescribed, benefits of exercising 30 minutes per day and prevention of complications of DM.   Goals 1. Increase fresh fruits and low carb vegetables  2. Eat breakfast daily. 3. Watch portion sizes. 4. Cut out processed foods 5. Cut out sweetner in the coffee 6. Drink more water 7. Lose 1 lb per week.  8. Start going to the [facility] three times per week.  Teaching Method  Utilized: Visual Auditory Hands on  Handouts given during visit include:  The Plate Method  Meal Plan Card  Diabetes Instructions.   Barriers to learning/adherence to lifestyle change:  None  Demonstrated degree of understanding via:  Teach Back   Monitoring/Evaluation:  Dietary intake, exercise, meal planning  and body weight in 3 month.

## 2016-08-18 NOTE — Patient Instructions (Signed)
Goals 1. Increase fresh fruits and low carb vegetables  2. Eat breakfast daily. 3. Watch portion sizes. 4. Cut out processed foods 5. Cut out sweetner in the coffee 6. Drink more water 7. Lose 1 lb per week.  8. Start going to the [facility] three times per week.

## 2016-08-23 ENCOUNTER — Other Ambulatory Visit: Payer: Self-pay

## 2016-08-23 ENCOUNTER — Ambulatory Visit (INDEPENDENT_AMBULATORY_CARE_PROVIDER_SITE_OTHER): Payer: Medicare Other

## 2016-08-23 DIAGNOSIS — I509 Heart failure, unspecified: Secondary | ICD-10-CM | POA: Diagnosis not present

## 2016-08-23 DIAGNOSIS — I429 Cardiomyopathy, unspecified: Secondary | ICD-10-CM

## 2016-08-25 ENCOUNTER — Telehealth: Payer: Self-pay | Admitting: *Deleted

## 2016-08-25 NOTE — Telephone Encounter (Signed)
Notes Recorded by Laurine Blazer, LPN on [id number] at [id number] AM EDT Left message to return call.  ------  Notes Recorded by Satira Sark, MD on 08/24/2016 at 3:17 PM EDT Results reviewed. LVEF approximately 40% at this time. We will continue medical therapy and discuss further at office follow-up. A copy of this test should be forwarded to Curlene Labrum, MD.

## 2016-08-29 NOTE — Telephone Encounter (Signed)
Notes Recorded by Laurine Blazer, LPN on [id number] at [id number] PM EDT Patient notified. 6 mo recall scheduled for October. Copy to pmd.

## 2016-09-14 DIAGNOSIS — Z0001 Encounter for general adult medical examination with abnormal findings: Secondary | ICD-10-CM | POA: Diagnosis not present

## 2016-09-14 DIAGNOSIS — R5383 Other fatigue: Secondary | ICD-10-CM | POA: Diagnosis not present

## 2016-09-14 DIAGNOSIS — E782 Mixed hyperlipidemia: Secondary | ICD-10-CM | POA: Diagnosis not present

## 2016-09-14 DIAGNOSIS — I10 Essential (primary) hypertension: Secondary | ICD-10-CM | POA: Diagnosis not present

## 2016-09-14 DIAGNOSIS — E559 Vitamin D deficiency, unspecified: Secondary | ICD-10-CM | POA: Diagnosis not present

## 2016-09-19 DIAGNOSIS — Z1389 Encounter for screening for other disorder: Secondary | ICD-10-CM | POA: Diagnosis not present

## 2016-09-19 DIAGNOSIS — Z0001 Encounter for general adult medical examination with abnormal findings: Secondary | ICD-10-CM | POA: Diagnosis not present

## 2016-09-19 DIAGNOSIS — I10 Essential (primary) hypertension: Secondary | ICD-10-CM | POA: Diagnosis not present

## 2016-09-19 DIAGNOSIS — Z23 Encounter for immunization: Secondary | ICD-10-CM | POA: Diagnosis not present

## 2016-09-28 DIAGNOSIS — Z1231 Encounter for screening mammogram for malignant neoplasm of breast: Secondary | ICD-10-CM | POA: Diagnosis not present

## 2016-09-29 ENCOUNTER — Encounter: Payer: Self-pay | Admitting: *Deleted

## 2016-10-01 NOTE — Progress Notes (Signed)
Cardiology Office Note  Date: 10/02/2016   ID: Caroline Mason, DOB 1956-01-08, MRN [id number]  PCP: Caroline Labrum, MD  Primary Cardiologist: Caroline Lesches, MD   Chief Complaint  Patient presents with  . Cardiomyopathy    History of Present Illness: Caroline Mason is a 60 y.o. female last seen in February. She presents for a routine follow-up visit. Still doing very well, she is exercising at a senior center 4-5 days a week, exercising for about an hour each time. She does aerobics classes and also works out with Social research officer, government and treadmill. She reports NYHA class 1-2 dyspnea, no exertional chest pain.  I reviewed her cardiac medications. She continues on Coreg, Lotensin, Norvasc, Lipitor, Lasix, and Aldactone. Her weight is down, she states that she has been dieting with the nutritionist.  Updated echocardiogram from August is outlined below. ECG today shows sinus rhythm with decreased R wave progression and nonspecific T-wave changes. She reports having had recent lab work with Caroline Mason.  Past Medical History:  Diagnosis Date  . Atypical pneumonia    12/11  . Cardiomyopathy    Nonischemic, normal coronaries, LVEF 40-45%  . COPD (chronic obstructive pulmonary disease) ([place])   . Depression   . Essential hypertension, benign   . Fibromyalgia   . Morbid obesity ([place])   . Obstructive sleep apnea   . Rheumatoid arthritis(714.0)     Past Surgical History:  Procedure Laterality Date  . TUBAL LIGATION  1989    Current Outpatient Prescriptions  Medication Sig Dispense Refill  . amLODipine (NORVASC) 10 MG tablet Take 10 mg by mouth daily.    Marland Kitchen atorvastatin (LIPITOR) 10 MG tablet Take 10 mg by mouth daily.    . benazepril (LOTENSIN) 20 MG tablet Take 1 tablet (20 mg total) by mouth daily. 30 tablet 0  . carvedilol (COREG) 6.25 MG tablet Take 1 tablet (6.25 mg total) by mouth 2 (two) times daily. 60 tablet 6  . Chlorpheniramine-PSE-Ibuprofen  (ADVIL ALLERGY SINUS) 2-30-200 MG TABS Take 1 tablet by mouth daily as needed.      . cholecalciferol (VITAMIN D) 1000 units tablet Take 1,000 Units by mouth daily.    Marland Kitchen docusate sodium (COLACE) 100 MG capsule Take 100 mg by mouth as needed.    . ferrous sulfate 325 (65 FE) MG tablet Take 325 mg by mouth 2 (two) times a week.     . fluticasone (FLONASE) 50 MCG/ACT nasal spray Place 2 sprays into the nose as needed.     . furosemide (LASIX) 20 MG tablet Take 1 tablet (20 mg total) by mouth daily. 30 tablet 6  . gabapentin (NEURONTIN) 300 MG capsule Take 300 mg by mouth 2 (two) times daily.    Marland Kitchen guaiFENesin-dextromethorphan (ROBITUSSIN DM) 100-10 MG/5ML syrup Take 5 mLs by mouth 3 (three) times daily as needed.      . hydroxychloroquine (PLAQUENIL) 200 MG tablet Take 200 mg by mouth 2 (two) times daily.    . sertraline (ZOLOFT) 100 MG tablet Take 100 mg by mouth daily.    Marland Kitchen spironolactone (ALDACTONE) 25 MG tablet Take 1 tablet (25 mg total) by mouth daily. 30 tablet 6   No current facility-administered medications for this visit.    Allergies:  Aspirin   Social History: The patient  reports that she has been smoking Cigarettes.  She started smoking about 40 years ago. She has a 9.00 pack-year smoking history. She has never used smokeless tobacco. She reports that  she does not drink alcohol or use drugs.   ROS:  Please see the history of present illness. Otherwise, complete review of systems is positive for none.  All other systems are reviewed and negative.   Physical Exam: VS:  BP 118/77   Pulse 63   Ht 5\' 6"  (1.676 m)   Wt 237 lb (107.5 kg)   BMI 38.25 kg/m , BMI Body mass index is 38.25 kg/m.  Wt Readings from Last 3 Encounters:  10/02/16 237 lb (107.5 kg)  08/18/16 240 lb (108.9 kg)  05/08/16 244 lb (110.7 kg)    Overweight woman in no acute distress.  HEENT: Conjunctiva and lids normal, oropharynx with moist mucosa.  Neck: Supple, no elevated JVP or carotid bruits, no  thyromegaly.  Lungs: Clear to auscultation, nonlabored.  Cardiac: Regular rate and rhythm, no S3 gallop.  Abdomen: Soft, nontender, bowel sounds present.  Skin: Warm and dry.  Extremities: No pitting edema, distal pulses full.  Musculoskeletal: No kyphosis. Neuropsychiatric: Alert and oriented 3, affect appropriate.  ECG: I personally reviewed the tracing from 06/03/2015 which showed normal sinus rhythm with leftward axis and poor R-wave progression.  Recent Labwork:  March 2017: BUN 9, creatinine 0.76, potassium 4.3, AST 15, ALT 12, cholesterol 128, triglycerides 89, HDL 57, LDL 53, Hgb 13, platelets 254, Hgb A1c 5.8  Other Studies Reviewed Today:  Echocardiogram 08/23/2016: Study Conclusions  - Left ventricle: The cavity size was moderately dilated. Wall   thickness was increased in a pattern of mild LVH. The estimated   ejection fraction was 40%. Diffuse hypokinesis. Doppler   parameters are consistent with abnormal left ventricular   relaxation (grade 1 diastolic dysfunction). - Aortic valve: Mildly calcified annulus. Trileaflet. - Left atrium: The atrium was moderately dilated. - Right atrium: The atrium was mildly dilated. Central venous   pressure (est): 3 mm Hg. - Atrial septum: No defect or patent foramen ovale was identified. - Tricuspid valve: There was trivial regurgitation. - Pulmonary arteries: PA peak pressure: 11 mm Hg (S). - Pericardium, extracardiac: There was no pericardial effusion.  Impressions:  - Mild LV wall thickness with moderate chamber dilatation and LVEF   approximately 40%. There is diffuse hypokinesis. Grade 1   diastolic dysfunction. Moderate left atrial enlargement. Mildly   calcified aortic annulus. Trivial tricuspid regurgitation with   normal estimated PASP.  Assessment and Plan:  1. Nonischemic cardiomyopathy with LVEF approximately 40%. She is symptomatically quite stable, exercising regularly and tolerating current medical  regimen. No changes were made today.  2. Essential hypertension, blood pressure is well controlled today.  3. Obesity, patient is losing weight through diet and exercise, sees a nutrition as well.  4. Obstructive sleep apnea.  5. History of intermittent tobacco use, total cessation recommended.  Current medicines were reviewed with the patient today.   Orders Placed This Encounter  Procedures  . EKG 12-Lead    Disposition: Follow-up with me in 6 months.  Signed, Satira Sark, MD, [facility] 10/02/2016 3:13 PM    [facility], [place], [place] [address] Phone: [phone]; Fax: [phone]

## 2016-10-02 ENCOUNTER — Encounter: Payer: Self-pay | Admitting: *Deleted

## 2016-10-02 ENCOUNTER — Encounter: Payer: Self-pay | Admitting: Cardiology

## 2016-10-02 ENCOUNTER — Ambulatory Visit (INDEPENDENT_AMBULATORY_CARE_PROVIDER_SITE_OTHER): Payer: Medicare Other | Admitting: Cardiology

## 2016-10-02 VITALS — BP 118/77 | HR 63 | Ht 66.0 in | Wt 237.0 lb

## 2016-10-02 DIAGNOSIS — E6609 Other obesity due to excess calories: Secondary | ICD-10-CM | POA: Diagnosis not present

## 2016-10-02 DIAGNOSIS — I429 Cardiomyopathy, unspecified: Secondary | ICD-10-CM

## 2016-10-02 DIAGNOSIS — I10 Essential (primary) hypertension: Secondary | ICD-10-CM

## 2016-10-02 DIAGNOSIS — G4733 Obstructive sleep apnea (adult) (pediatric): Secondary | ICD-10-CM

## 2016-10-02 DIAGNOSIS — Z6838 Body mass index (BMI) 38.0-38.9, adult: Secondary | ICD-10-CM

## 2016-10-02 NOTE — Patient Instructions (Signed)

## 2016-11-22 ENCOUNTER — Encounter: Payer: Self-pay | Admitting: Nutrition

## 2016-11-22 ENCOUNTER — Encounter: Payer: Medicare Other | Attending: Family Medicine | Admitting: Nutrition

## 2016-11-22 VITALS — Wt 237.8 lb

## 2016-11-22 DIAGNOSIS — E669 Obesity, unspecified: Secondary | ICD-10-CM

## 2016-11-22 DIAGNOSIS — R739 Hyperglycemia, unspecified: Secondary | ICD-10-CM

## 2016-11-22 NOTE — Progress Notes (Signed)
Medical Nutrition Therapy:  Appt start time:1100 end time: 1130 Assessment:  Primary concerns today: Prediabetes and obesity.  Changed: exercising aerobics and machines 3-4 times a week at the [facility].  Feels better. Lost 3 lbs. Clothes fitting looser.  Has been eating more vegetables and watching her portions. Trying to avoid salty foods. Good check up with her heart MD.Labs done 9/17    Lipid profile WNL    A1C 5.5%, down from 5.8%.   She lost 16 lbs since April of last year.   Wt Readings from Last 3 Encounters:  11/22/16 237 lb 12.8 oz (107.9 kg)  10/02/16 237 lb (107.5 kg)  08/18/16 240 lb (108.9 kg)   Ht Readings from Last 3 Encounters:  10/02/16 5\' 6"  (1.676 m)  08/18/16 5\' 6"  (1.676 m)  05/08/16 5\' 5"  (1.651 m)   Body mass index is 38.38 kg/m.   Preferred Learning Style:   No preference indicated   Learning Readiness:  Ready  Change in progress   MEDICATIONS: See lis   DIETARY INTAKE:    24-hr recall:  B ( AM): Oatmeal or banana or eggs and oatmeal nk ( AM): none  L ( PM): Half steak biscuit with oatmeal, coffee, water Snk ( PM): yogurt  D ( PM): won ton soup, roast beef and corn bread and turnip greens, Sweet tea, water Beverages: water, Usual physical activity: aerobics exercises  Estimated energy needs: 1500 calories 170 g carbohydrates 112 g protein 42 g fat  Progress Towards Goal(s):  In progress.   Nutritional Diagnosis:  NB-1.1 Food and nutrition-related knowledge deficit As related to Prediabetes and morbid obesity.  As evidenced by A1CD 5.8 and BMI > 40..    Intervention:  Nutrition and Diabetes education provided on My Plate, CHO counting, meal planning, portion sizes, timing of meals, avoiding snacks between meals unless having a low blood sugar, target ranges for A1C and blood sugars, signs/symptoms and treatment of hyper/hypoglycemia, monitoring blood sugars, taking medications as prescribed, benefits of exercising 30 minutes per day  and prevention of complications of DM.   Goals Keep up the great job!!!  Increase fresh vegetables to 2 servings with lunch and dinner Cut back on bread Drink more.water Keep exercising and increase intensity to  60 mins 5 days per week Lose 1 lbs per week  Teaching Method Utilized: Visual Auditory Hands on  Handouts given during visit include:  The Plate Method  Meal Plan Card  Diabetes Instructions.   Barriers to learning/adherence to lifestyle change:  None  Demonstrated degree of understanding via:  Teach Back   Monitoring/Evaluation:  Dietary intake, exercise, meal planning  and body weight in 3-4 months .

## 2016-11-22 NOTE — Patient Instructions (Addendum)
Goals Keep up the great job!!!  Increase fresh vegetables to 2 servings with lunch and dinner Cut back on bread Drink more.water Keep exercising and increase intensity to  60 mins 5 days per week Lose 1 lbs per week

## 2016-12-01 DIAGNOSIS — M19042 Primary osteoarthritis, left hand: Secondary | ICD-10-CM

## 2016-12-01 DIAGNOSIS — M797 Fibromyalgia: Secondary | ICD-10-CM | POA: Insufficient documentation

## 2016-12-01 DIAGNOSIS — M19072 Primary osteoarthritis, left ankle and foot: Secondary | ICD-10-CM

## 2016-12-01 DIAGNOSIS — M19041 Primary osteoarthritis, right hand: Secondary | ICD-10-CM | POA: Insufficient documentation

## 2016-12-01 DIAGNOSIS — Z79899 Other long term (current) drug therapy: Secondary | ICD-10-CM | POA: Insufficient documentation

## 2016-12-01 DIAGNOSIS — M170 Bilateral primary osteoarthritis of knee: Secondary | ICD-10-CM | POA: Insufficient documentation

## 2016-12-01 DIAGNOSIS — M19071 Primary osteoarthritis, right ankle and foot: Secondary | ICD-10-CM | POA: Insufficient documentation

## 2016-12-01 DIAGNOSIS — M0579 Rheumatoid arthritis with rheumatoid factor of multiple sites without organ or systems involvement: Secondary | ICD-10-CM | POA: Insufficient documentation

## 2016-12-01 NOTE — Progress Notes (Signed)
Office Visit Note  Patient: Caroline Mason             Date of Birth: 02-Nov-1956           MRN: [id number]             PCP: Curlene Labrum, MD Referring: Curlene Labrum, MD Visit Date: 12/05/2016 Occupation: @GUAROCC @    Subjective:  No chief complaint on file. Follow-up on rheumatoid arthritis, high risk prescription, fibromyalgia.   History of Present Illness: Caroline Mason is a 60 y.o. female  Last seen 07/04/2016 Doing really well with her rheumatoid arthritis. Using Plaquenil 20 mg twice a day. Last visit she was wondering whether the pain that she is having is really from her RA her fibromyalgia. We discussed all of that in detail in the last visit and even offered her methotrexate if indicated but after discussion she relies that much of her pain was coming from fibromyalgia and in actuality her rheumatoid arthritis was well controlled. Today she states that her fibromyalgia is flaring. She is hurting to 16 out of 18 tender points except for bilateral greater trochanter bursa area. Most painful is her right greater trochanter bursa and we offered her cortisone injection and she is agreeable.  She is also having left knee joint pain for the last 2 months. She's been exercising as advised by her gym exercise attendant to exercise 5 times a week. As a result she has flared and her left knee joint is also painful. Prior to that she was doing 3 times a week and seeing good results with her exercise and not flaring.  She's taking her Plaquenil as prescribed at 20 mg twice a day.  She is due for her CBC with differential CMP with GFR today.   Activities of Daily Living:  Patient reports morning stiffness for 30 minutes.   Patient Reports nocturnal pain.  Difficulty dressing/grooming: Denies Difficulty climbing stairs: Reports Difficulty getting out of chair: Reports Difficulty using hands for taps, buttons, cutlery, and/or writing: Reports   Review of Systems    Constitutional: Positive for fatigue.  HENT: Negative for mouth sores and mouth dryness.   Eyes: Negative for dryness.  Respiratory: Negative for shortness of breath.   Gastrointestinal: Negative for constipation and diarrhea.  Musculoskeletal: Positive for myalgias and myalgias.  Skin: Negative for sensitivity to sunlight.  Psychiatric/Behavioral: Positive for sleep disturbance. Negative for decreased concentration.    PMFS History:  Patient Active Problem List   Diagnosis Date Noted  . High risk medication use 12/01/2016  . Rheumatoid arthritis with rheumatoid factor of multiple sites without organ or systems involvement ([place]) 12/01/2016  . Fibromyalgia 12/01/2016  . Primary osteoarthritis of both hands 12/01/2016  . Primary osteoarthritis of both knees 12/01/2016  . Primary osteoarthritis of both feet 12/01/2016  . Bradycardia 07/18/2011  . Chronic systolic heart failure ([place]) 06/05/2011  . ANEMIA 03/01/2011  . TOBACCO ABUSE 01/03/2011  . OBSTRUCTIVE SLEEP APNEA 01/03/2011  . ESSENTIAL HYPERTENSION, BENIGN 01/03/2011  . Secondary cardiomyopathy ([place]) 01/03/2011  . COPD 01/03/2011    Past Medical History:  Diagnosis Date  . Atypical pneumonia    12/11  . Cardiomyopathy    Nonischemic, normal coronaries, LVEF 40-45%  . COPD (chronic obstructive pulmonary disease) ([place])   . Depression   . Essential hypertension, benign   . Fibromyalgia   . Morbid obesity ([place])   . Obstructive sleep apnea   . Rheumatoid arthritis(714.0)     Family History  Problem Relation Age of Onset  . Stroke Mother   . Hypertension Mother   . Heart failure Sister 97  . Breast cancer Sister   . Heart failure Sister 8  . Cancer Sister   . Ovarian cancer Sister   . Diabetes Maternal Grandmother   . Diabetes Paternal Grandfather    Past Surgical History:  Procedure Laterality Date  . TUBAL LIGATION  1989  . WRIST SURGERY  2016   Social History   Social History Narrative   Lives in  [place].     Objective: Vital Signs: BP 120/84 (BP Location: Left Arm, Patient Position: Sitting, Cuff Size: Large)   Pulse 84   Resp 14   Ht 5\' 6"  (1.676 m)   Wt 237 lb (107.5 kg)   BMI 38.25 kg/m    Physical Exam  Constitutional: She is oriented to person, place, and time. She appears well-developed and well-nourished.  HENT:  Head: Normocephalic and atraumatic.  Eyes: EOM are normal. Pupils are equal, round, and reactive to light.  Cardiovascular: Normal rate, regular rhythm and normal heart sounds.  Exam reveals no gallop and no friction rub.   No murmur heard. Pulmonary/Chest: Effort normal and breath sounds normal. She has no wheezes. She has no rales.  Abdominal: Soft. Bowel sounds are normal. She exhibits no distension. There is no tenderness. There is no guarding. No hernia.  Musculoskeletal: Normal range of motion. She exhibits no edema, tenderness or deformity.  Lymphadenopathy:    She has no cervical adenopathy.  Neurological: She is alert and oriented to person, place, and time. Coordination normal.  Skin: Skin is warm and dry. Capillary refill takes less than 2 seconds. No rash noted.  Psychiatric: She has a normal mood and affect. Her behavior is normal.  Nursing note and vitals reviewed.    Musculoskeletal Exam:  Full range of motion of all joints Grip strength is equal and strong bilaterally Fibromyalgia tender points are 16 out of 18 positive with only spared area to be bilateral trapezius muscles.  CDAI Exam: CDAI Homunculus Exam:   Joint Counts:  CDAI Tender Joint count: 0 CDAI Swollen Joint count: 0  Global Assessments:  Patient Global Assessment: 6 Provider Global Assessment: 6  CDAI Calculated Score: 12  No synovitis on examination  Investigation: Findings:  06/16/2016 normal CBC and CMP Normal plaquenil eye exam 06/2016  Labs are due today that include CBC with differential CMP with GFR  Imaging: No results found.  Speciality  Comments: No specialty comments available.    Procedures:  Large Joint Inj Date/Time: 12/05/2016 2:39 PM Performed by: Eliezer Lofts Authorized by: Eliezer Lofts   Consent Given by:  Patient Site marked: the procedure site was marked   Timeout: prior to procedure the correct patient, procedure, and site was verified   Indications:  Pain Location:  Hip Site:  R greater trochanter Prep: patient was prepped and draped in usual sterile fashion   Needle Size:  27 G Approach:  Superior Ultrasound Guidance: No   Fluoroscopic Guidance: No   Arthrogram: No   Medications:  1.5 mL lidocaine 1 %; 40 mg triamcinolone acetonide 40 MG/ML Aspiration Attempted: Yes   Aspirate amount (mL):  0 Patient tolerance:  Patient tolerated the procedure well with no immediate complications  Right greater trochanter bursa injected per usual protocol. Patient was 90% better 5 minutes after the injection. She will use Voltaren gel to the other places that are bothersome. Her standard protocol. I've discussed the protocol and  detail with the patient   Allergies: Aspirin   Assessment / Plan:     Visit Diagnoses: Chronic systolic heart failure (HCC)  Rheumatoid arthritis with rheumatoid factor of multiple sites without organ or systems involvement (HCC)  High risk medication use - plq 200 bid; - Plan: CBC with Differential/Platelet, COMPLETE METABOLIC PANEL WITH GFR  Fibromyalgia  Primary osteoarthritis of both hands  Primary osteoarthritis of both knees  Primary osteoarthritis of both feet  Psoriatic arthropathy (HCC)   Plan: Refill Plaquenil 2 mg twice a day 90 day supply with a refill CBC with differential CMP with GFR in office today and X return to clinic in 5 months for follow-up next decreased exercise regimen to 50 minutes 3 times a week and 75 times a week next patient's Plaquenil eye exam was done by Dr. Velvet Bathe office December 2017 and was normal.   Orders: Orders Placed This  Encounter  Procedures  . Large Joint Injection/Arthrocentesis  . CBC with Differential/Platelet  . COMPLETE METABOLIC PANEL WITH GFR   No orders of the defined types were placed in this encounter.   Face-to-face time spent with patient was 30 minutes. 50% of time was spent in counseling and coordination of care.  Follow-Up Instructions: Return in about 5 months (around 05/05/2017).   Eliezer Lofts, PA-C   I examined and evaluated the patient with Eliezer Lofts PA. The plan of care was discussed as noted above.  Bo Merino, MD

## 2016-12-05 ENCOUNTER — Encounter: Payer: Self-pay | Admitting: Rheumatology

## 2016-12-05 ENCOUNTER — Ambulatory Visit: Payer: Self-pay | Admitting: Rheumatology

## 2016-12-05 ENCOUNTER — Ambulatory Visit (INDEPENDENT_AMBULATORY_CARE_PROVIDER_SITE_OTHER): Payer: Medicare Other | Admitting: Rheumatology

## 2016-12-05 VITALS — BP 120/84 | HR 84 | Resp 14 | Ht 66.0 in | Wt 237.0 lb

## 2016-12-05 DIAGNOSIS — I5022 Chronic systolic (congestive) heart failure: Secondary | ICD-10-CM

## 2016-12-05 DIAGNOSIS — M170 Bilateral primary osteoarthritis of knee: Secondary | ICD-10-CM | POA: Diagnosis not present

## 2016-12-05 DIAGNOSIS — M797 Fibromyalgia: Secondary | ICD-10-CM

## 2016-12-05 DIAGNOSIS — L4050 Arthropathic psoriasis, unspecified: Secondary | ICD-10-CM

## 2016-12-05 DIAGNOSIS — M25562 Pain in left knee: Secondary | ICD-10-CM

## 2016-12-05 DIAGNOSIS — M19071 Primary osteoarthritis, right ankle and foot: Secondary | ICD-10-CM | POA: Diagnosis not present

## 2016-12-05 DIAGNOSIS — Z79899 Other long term (current) drug therapy: Secondary | ICD-10-CM | POA: Diagnosis not present

## 2016-12-05 DIAGNOSIS — M19041 Primary osteoarthritis, right hand: Secondary | ICD-10-CM | POA: Diagnosis not present

## 2016-12-05 DIAGNOSIS — M7061 Trochanteric bursitis, right hip: Secondary | ICD-10-CM

## 2016-12-05 DIAGNOSIS — M0579 Rheumatoid arthritis with rheumatoid factor of multiple sites without organ or systems involvement: Secondary | ICD-10-CM | POA: Diagnosis not present

## 2016-12-05 DIAGNOSIS — M19072 Primary osteoarthritis, left ankle and foot: Secondary | ICD-10-CM

## 2016-12-05 DIAGNOSIS — M19042 Primary osteoarthritis, left hand: Secondary | ICD-10-CM

## 2016-12-05 MED ORDER — HYDROXYCHLOROQUINE SULFATE 200 MG PO TABS: 200.0000 mg | ORAL_TABLET | Freq: Two times a day (BID) | ORAL | 1 refills | Status: DC

## 2016-12-05 MED ORDER — TRIAMCINOLONE ACETONIDE 40 MG/ML IJ SUSP
40.0000 mg | INTRAMUSCULAR | Status: AC | PRN
  Administered 2016-12-05: 40 mg via INTRA_ARTICULAR

## 2016-12-05 MED ORDER — LIDOCAINE HCL 1 % IJ SOLN
1.5000 mL | INTRAMUSCULAR | Status: AC | PRN
  Administered 2016-12-05: 1.5 mL

## 2016-12-06 LAB — COMPLETE METABOLIC PANEL WITH GFR
ALBUMIN: 4 g/dL (ref 3.6–5.1)
ALK PHOS: 102 U/L (ref 33–130)
ALT: 13 U/L (ref 6–29)
AST: 16 U/L (ref 10–35)
BILIRUBIN TOTAL: 0.7 mg/dL (ref 0.2–1.2)
BUN: 11 mg/dL (ref 7–25)
CALCIUM: 9.1 mg/dL (ref 8.6–10.4)
CO2: 28 mmol/L (ref 20–31)
CREATININE: 0.73 mg/dL (ref 0.50–1.05)
Chloride: 104 mmol/L (ref 98–110)
GFR, Est African American: >89 mL/min (ref >=60)
GFR, Est Non African American: >89 mL/min (ref >=60)
GLUCOSE: 106 mg/dL — AB (ref 65–99)
POTASSIUM: 4.2 mmol/L (ref 3.5–5.3)
SODIUM: 142 mmol/L (ref 135–146)
TOTAL PROTEIN: 6.8 g/dL (ref 6.1–8.1)

## 2016-12-06 LAB — CBC WITH DIFFERENTIAL/PLATELET
BASOS ABS: 0 {cells}/uL (ref 0–200)
Basophils Relative: 0 %
Eosinophils Absolute: 72 cells/uL (ref 15–500)
Eosinophils Relative: 1 %
HEMATOCRIT: 40.7 % (ref 35.0–45.0)
HEMOGLOBIN: 13.1 g/dL (ref 11.7–15.5)
LYMPHS ABS: 2520 {cells}/uL (ref 850–3900)
Lymphocytes Relative: 35 %
MCH: 28.1 pg (ref 27.0–33.0)
MCHC: 32.2 g/dL (ref 32.0–36.0)
MCV: 87.3 fL (ref 80.0–100.0)
MONO ABS: 360 {cells}/uL (ref 200–950)
MPV: 8.8 fL (ref 7.5–12.5)
Monocytes Relative: 5 %
NEUTROS ABS: 4248 {cells}/uL (ref 1500–7800)
NEUTROS PCT: 59 %
Platelets: 238 10*3/uL (ref 140–400)
RBC: 4.66 MIL/uL (ref 3.80–5.10)
RDW: 14.1 % (ref 11.0–15.0)
WBC: 7.2 10*3/uL (ref 3.8–10.8)

## 2016-12-06 NOTE — Progress Notes (Signed)
Notified patient1: CBC with differential and CMP with GFR are normal. Except nonfasting glucose is slightly elevated which is nothing to worry about.No change in treatment.

## 2017-01-31 ENCOUNTER — Other Ambulatory Visit: Payer: Self-pay | Admitting: Cardiology

## 2017-02-14 DIAGNOSIS — L720 Epidermal cyst: Secondary | ICD-10-CM | POA: Diagnosis not present

## 2017-02-16 DIAGNOSIS — M797 Fibromyalgia: Secondary | ICD-10-CM | POA: Diagnosis not present

## 2017-02-16 DIAGNOSIS — Z8249 Family history of ischemic heart disease and other diseases of the circulatory system: Secondary | ICD-10-CM | POA: Diagnosis not present

## 2017-02-16 DIAGNOSIS — I110 Hypertensive heart disease with heart failure: Secondary | ICD-10-CM | POA: Diagnosis not present

## 2017-02-16 DIAGNOSIS — L729 Follicular cyst of the skin and subcutaneous tissue, unspecified: Secondary | ICD-10-CM | POA: Diagnosis not present

## 2017-02-16 DIAGNOSIS — I10 Essential (primary) hypertension: Secondary | ICD-10-CM | POA: Diagnosis not present

## 2017-02-16 DIAGNOSIS — Z7951 Long term (current) use of inhaled steroids: Secondary | ICD-10-CM | POA: Diagnosis not present

## 2017-02-16 DIAGNOSIS — G4733 Obstructive sleep apnea (adult) (pediatric): Secondary | ICD-10-CM | POA: Diagnosis not present

## 2017-02-16 DIAGNOSIS — Z87891 Personal history of nicotine dependence: Secondary | ICD-10-CM | POA: Diagnosis not present

## 2017-02-16 DIAGNOSIS — Z886 Allergy status to analgesic agent status: Secondary | ICD-10-CM | POA: Diagnosis not present

## 2017-02-16 DIAGNOSIS — I509 Heart failure, unspecified: Secondary | ICD-10-CM | POA: Diagnosis not present

## 2017-02-16 DIAGNOSIS — Z833 Family history of diabetes mellitus: Secondary | ICD-10-CM | POA: Diagnosis not present

## 2017-02-16 DIAGNOSIS — E782 Mixed hyperlipidemia: Secondary | ICD-10-CM | POA: Diagnosis not present

## 2017-02-16 DIAGNOSIS — Z79899 Other long term (current) drug therapy: Secondary | ICD-10-CM | POA: Diagnosis not present

## 2017-02-16 DIAGNOSIS — L720 Epidermal cyst: Secondary | ICD-10-CM | POA: Diagnosis not present

## 2017-02-16 DIAGNOSIS — M069 Rheumatoid arthritis, unspecified: Secondary | ICD-10-CM | POA: Diagnosis not present

## 2017-02-17 ENCOUNTER — Other Ambulatory Visit: Payer: Self-pay | Admitting: Cardiology

## 2017-03-12 DIAGNOSIS — I10 Essential (primary) hypertension: Secondary | ICD-10-CM | POA: Diagnosis not present

## 2017-03-12 DIAGNOSIS — M0580 Other rheumatoid arthritis with rheumatoid factor of unspecified site: Secondary | ICD-10-CM | POA: Diagnosis not present

## 2017-03-12 DIAGNOSIS — E782 Mixed hyperlipidemia: Secondary | ICD-10-CM | POA: Diagnosis not present

## 2017-03-12 DIAGNOSIS — R7301 Impaired fasting glucose: Secondary | ICD-10-CM | POA: Diagnosis not present

## 2017-04-11 ENCOUNTER — Encounter: Payer: Medicare Other | Attending: Family Medicine | Admitting: Nutrition

## 2017-04-11 ENCOUNTER — Ambulatory Visit: Payer: Self-pay | Admitting: Nutrition

## 2017-04-11 VITALS — Ht 66.0 in | Wt 234.4 lb

## 2017-04-11 DIAGNOSIS — Z6837 Body mass index (BMI) 37.0-37.9, adult: Principal | ICD-10-CM

## 2017-04-11 NOTE — Patient Instructions (Addendum)
Goals 1. Lose 5 lbs in the next month. 2. Drink 5 bottles per day water 3. Keep eating more fresh fruits and vegetables. 4. Cut out sweets and desserts 5. Continue to reduce processed breads. Keep a food journal daily.

## 2017-04-11 NOTE — Progress Notes (Signed)
Medical Nutrition Therapy:  Appt start time:1015  end time: 1030Assessment:  Primary concerns today: Prediabetes and obesity. Lost 3 lbs. 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 Working on portion sizes and eating more fresh fruits and vegetables.Admits to needing to drink more water.   Wt Readings from Last 3 Encounters:  04/11/17 234 lb 6.4 oz (106.3 kg)  12/05/16 237 lb (107.5 kg)  11/22/16 237 lb 12.8 oz (107.9 kg)   Ht Readings from Last 3 Encounters:  04/11/17 5\' 6"  (1.676 m)  12/05/16 5\' 6"  (1.676 m)  10/02/16 5\' 6"  (1.676 m)   Body mass index is 37.83 kg/m.   Preferred Learning Style:   No preference indicated   Learning Readiness:  Ready  Change in progress   MEDICATIONS: See lis   DIETARY INTAKE:    24-hr recall:  B ( AM): Oatmeal or banana or eggs and oatmeal nk ( AM): none  L ( PM): Half steak biscuit with oatmeal, coffee, water Snk ( PM): yogurt  D ( PM): won ton soup, roast beef and corn bread and turnip greens, Sweet tea, water Beverages: water, Usual physical activity: aerobics exercises  Estimated energy needs: 1500 calories 170 g carbohydrates 112 g protein 42 g fat  Progress Towards Goal(s):  In progress.   Nutritional Diagnosis:  NB-1.1 Food and nutrition-related knowledge deficit As related to Prediabetes and morbid obesity.  As evidenced by A1CD 5.8 and BMI > 40..    Intervention:  Nutrition and Diabetes education provided on My Plate, CHO counting, meal planning, portion sizes, timing of meals, avoiding snacks between meals unless having a low blood sugar, target ranges for A1C and blood sugars, signs/symptoms and treatment of hyper/hypoglycemia, monitoring blood sugars, taking medications as prescribed, benefits of exercising 30 minutes per day and prevention of complications of DM.   Goals Keep up the great job!!!  Goals 1. Lose 5 lbs in the next month. 2. Drink 5 bottles per day water 3. Keep eating more fresh fruits and  vegetables. 4. Cut out sweets and desserts 5. Continue to reduce processed breads. Keep a food journal daily.  Teaching Method Utilized: Visual Auditory Hands on  Handouts given during visit include:  The Plate Method  Meal Plan Card  Diabetes Instructions.   Barriers to learning/adherence to lifestyle change:  None  Demonstrated degree of understanding via:  Teach Back   Monitoring/Evaluation:  Dietary intake, exercise, meal planning  and body weight in 3-4 months .

## 2017-04-26 DIAGNOSIS — E782 Mixed hyperlipidemia: Secondary | ICD-10-CM | POA: Diagnosis not present

## 2017-04-26 DIAGNOSIS — G5602 Carpal tunnel syndrome, left upper limb: Secondary | ICD-10-CM | POA: Diagnosis not present

## 2017-04-26 DIAGNOSIS — I10 Essential (primary) hypertension: Secondary | ICD-10-CM | POA: Diagnosis not present

## 2017-04-26 DIAGNOSIS — G4733 Obstructive sleep apnea (adult) (pediatric): Secondary | ICD-10-CM | POA: Diagnosis not present

## 2017-04-26 DIAGNOSIS — D509 Iron deficiency anemia, unspecified: Secondary | ICD-10-CM | POA: Diagnosis not present

## 2017-05-09 ENCOUNTER — Ambulatory Visit: Payer: Self-pay | Admitting: Rheumatology

## 2017-06-07 NOTE — Progress Notes (Signed)
Cardiology Office Note  Date: 06/08/2017   ID: Caroline Mason, DOB 1956/09/07, MRN [id number]  PCP: Curlene Labrum, MD  Primary Cardiologist: Rozann Lesches, MD   Chief Complaint  Patient presents with  . Cardiomyopathy    History of Present Illness: Caroline Mason is a 61 y.o. female last seen in October 2017. She presents for a routine follow-up visit. 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 Has enjoyed being active but is having trouble with carpal tunnel symptoms. She does not report any exertional chest pain and has NYHA class II dyspnea. No palpitations or syncope.  I reviewed her current medications as outlined below. She has had no change in cardiac regimen. She reports compliance. States that she had recent lab work with Dr. Pleas Koch which we are requesting. She does not report any leg edema on current diuretics, and her weight is down 7 pounds since December 2017.  Echocardiogram from August of last year is outlined below, LVEF approximately 40% at that time.  Past Medical History:  Diagnosis Date  . Atypical pneumonia    12/11  . Cardiomyopathy    Nonischemic, normal coronaries, LVEF 40-45%  . COPD (chronic obstructive pulmonary disease) ([place])   . Depression   . Essential hypertension, benign   . Fibromyalgia   . Morbid obesity ([place])   . Obstructive sleep apnea   . Rheumatoid arthritis(714.0)     Past Surgical History:  Procedure Laterality Date  . TUBAL LIGATION  1989  . WRIST SURGERY  2016    Current Outpatient Prescriptions  Medication Sig Dispense Refill  . amLODipine (NORVASC) 10 MG tablet Take 10 mg by mouth daily.    Marland Kitchen atorvastatin (LIPITOR) 10 MG tablet Take 10 mg by mouth daily.    . benazepril (LOTENSIN) 20 MG tablet Take 1 tablet (20 mg total) by mouth daily. 30 tablet 0  . carvedilol (COREG) 6.25 MG tablet take 1 tablet by mouth twice a day 180 tablet 3  .  Chlorpheniramine-PSE-Ibuprofen (ADVIL ALLERGY SINUS) 2-30-200 MG TABS Take 1 tablet by mouth daily as needed.      . cholecalciferol (VITAMIN D) 1000 units tablet Take 1,000 Units by mouth daily.    Marland Kitchen docusate sodium (COLACE) 100 MG capsule Take 100 mg by mouth as needed.    . fluticasone (FLONASE) 50 MCG/ACT nasal spray Place 2 sprays into the nose as needed.     . furosemide (LASIX) 20 MG tablet Take 1 tablet (20 mg total) by mouth daily. 30 tablet 6  . gabapentin (NEURONTIN) 300 MG capsule Take 300 mg by mouth 2 (two) times daily.    Marland Kitchen guaiFENesin-dextromethorphan (ROBITUSSIN DM) 100-10 MG/5ML syrup Take 5 mLs by mouth 3 (three) times daily as needed.      . hydroxychloroquine (PLAQUENIL) 200 MG tablet Take 1 tablet (200 mg total) by mouth 2 (two) times daily. 180 tablet 1  . sertraline (ZOLOFT) 100 MG tablet Take 100 mg by mouth daily.    Marland Kitchen spironolactone (ALDACTONE) 25 MG tablet take 1 tablet by mouth once daily 30 tablet 6   No current facility-administered medications for this visit.    Allergies:  Aspirin   Social History: The patient  reports that she has been smoking Cigarettes.  She started smoking about 41 years ago. She has a 9.00 pack-year smoking history. She has never used smokeless tobacco. She reports that she does not drink alcohol or use  drugs.   ROS:  Please see the history of present illness. Otherwise, complete review of systems is positive for hand numbness and pain.  All other systems are reviewed and negative.   Physical Exam: VS:  BP (!) 145/86   Pulse (!) 59   Ht 5\' 6"  (1.676 m)   Wt 230 lb 3.2 oz (104.4 kg)   SpO2 97%   BMI 37.16 kg/m , BMI Body mass index is 37.16 kg/m.  Wt Readings from Last 3 Encounters:  06/08/17 230 lb 3.2 oz (104.4 kg)  04/11/17 234 lb 6.4 oz (106.3 kg)  12/05/16 237 lb (107.5 kg)    Overweight woman in no acute distress.  HEENT: Conjunctiva and lids normal, oropharynx with moist mucosa.  Neck: Supple, no elevated JVP or  carotid bruits, no thyromegaly.  Lungs: Clear to auscultation, nonlabored.  Cardiac: Regular rate and rhythm, no S3 gallop.  Abdomen: Soft, nontender, bowel sounds present.  Skin: Warm and dry.  Extremities: No pitting edema, distal pulses full.  Musculoskeletal: No kyphosis. Neuropsychiatric: Alert and oriented 3, affect appropriate.  ECG: I personally reviewed the tracing from 10/02/2016 which showed sinus rhythm with poor R-wave progression and nonspecific T-wave changes.  Recent Labwork: 12/05/2016: ALT 13; AST 16; BUN 11; Creat 0.73; Hemoglobin 13.1; Platelets 238; Potassium 4.2; Sodium 142   Other Studies Reviewed Today:  Echocardiogram 08/23/2016: Study Conclusions  - Left ventricle: The cavity size was moderately dilated. Wall thickness was increased in a pattern of mild LVH. The estimated ejection fraction was 40%. Diffuse hypokinesis. Doppler parameters are consistent with abnormal left ventricular relaxation (grade 1 diastolic dysfunction). - Aortic valve: Mildly calcified annulus. Trileaflet. - Left atrium: The atrium was moderately dilated. - Right atrium: The atrium was mildly dilated. Central venous pressure (est): 3 mm Hg. - Atrial septum: No defect or patent foramen ovale was identified. - Tricuspid valve: There was trivial regurgitation. - Pulmonary arteries: PA peak pressure: 11 mm Hg (S). - Pericardium, extracardiac: There was no pericardial effusion.  Impressions:  - Mild LV wall thickness with moderate chamber dilatation and LVEF approximately 40%. There is diffuse hypokinesis. Grade 1 diastolic dysfunction. Moderate left atrial enlargement. Mildly calcified aortic annulus. Trivial tricuspid regurgitation with normal estimated PASP.  Assessment and Plan:  1. Nonischemic cardiomyopathy with LVEF approximately 40% and diffuse hypokinesis by follow-up echocardiogram in August 2017. She is clinically stable without evidence of  fluid overload on current regimen. No changes were made today. Requesting recent lab work from PCP.  2. Essential hypertension, reports compliance with her medications. She has been trying to lose some weight as well.  Current medicines were reviewed with the patient today.  Disposition: Follow-up in 6 months.  Signed, Satira Sark, MD, [facility] 06/08/2017 8:46 AM    [facility] [address], [place], [place] [address] Phone: [phone]; Fax: [phone]

## 2017-06-08 ENCOUNTER — Encounter: Payer: Self-pay | Admitting: Cardiology

## 2017-06-08 ENCOUNTER — Ambulatory Visit (INDEPENDENT_AMBULATORY_CARE_PROVIDER_SITE_OTHER): Payer: Medicare Other | Admitting: Cardiology

## 2017-06-08 VITALS — BP 145/86 | HR 59 | Ht 66.0 in | Wt 230.2 lb

## 2017-06-08 DIAGNOSIS — I429 Cardiomyopathy, unspecified: Secondary | ICD-10-CM | POA: Diagnosis not present

## 2017-06-08 DIAGNOSIS — I10 Essential (primary) hypertension: Secondary | ICD-10-CM

## 2017-06-08 MED ORDER — AMLODIPINE BESYLATE 10 MG PO TABS: 10.0000 mg | ORAL_TABLET | Freq: Every day | ORAL | 6 refills | Status: DC

## 2017-06-08 NOTE — Patient Instructions (Signed)
Medication Instructions:  Your physician recommends that you continue on your current medications as directed. Please refer to the Current Medication list given to you today.  Labwork: REQUESTED LABS FROM YOUR PRIMARY DOCTOR   Testing/Procedures: NONE  Follow-Up: Your physician wants you to follow-up in: [place]. [place]. You will receive a reminder letter in the mail two months in advance. If you don't receive a letter, please call our office to schedule the follow-up appointment.  Any Other Special Instructions Will Be Listed Below (If Applicable).  If you need a refill on your cardiac medications before your next appointment, please call your pharmacy.

## 2017-06-15 ENCOUNTER — Ambulatory Visit (INDEPENDENT_AMBULATORY_CARE_PROVIDER_SITE_OTHER): Payer: Medicare Other | Admitting: Rheumatology

## 2017-06-15 ENCOUNTER — Encounter: Payer: Self-pay | Admitting: Rheumatology

## 2017-06-15 VITALS — BP 138/80 | HR 66 | Resp 14 | Ht 66.0 in | Wt 228.0 lb

## 2017-06-15 DIAGNOSIS — Z79899 Other long term (current) drug therapy: Secondary | ICD-10-CM | POA: Diagnosis not present

## 2017-06-15 DIAGNOSIS — M0579 Rheumatoid arthritis with rheumatoid factor of multiple sites without organ or systems involvement: Secondary | ICD-10-CM

## 2017-06-15 DIAGNOSIS — M461 Sacroiliitis, not elsewhere classified: Secondary | ICD-10-CM

## 2017-06-15 DIAGNOSIS — M797 Fibromyalgia: Secondary | ICD-10-CM

## 2017-06-15 DIAGNOSIS — M47818 Spondylosis without myelopathy or radiculopathy, sacral and sacrococcygeal region: Secondary | ICD-10-CM | POA: Diagnosis not present

## 2017-06-15 LAB — CBC WITH DIFFERENTIAL/PLATELET
BASOS ABS: 0 {cells}/uL (ref 0–200)
Basophils Relative: 0 %
EOS ABS: 150 {cells}/uL (ref 15–500)
EOS PCT: 2 %
HCT: 37.5 % (ref 35.0–45.0)
HEMOGLOBIN: 13.1 g/dL (ref 11.7–15.5)
Lymphocytes Relative: 31 %
Lymphs Abs: 2325 cells/uL (ref 850–3900)
MCH: 30.3 pg (ref 27.0–33.0)
MCHC: 34.9 g/dL (ref 32.0–36.0)
MCV: 86.6 fL (ref 80.0–100.0)
MONO ABS: 450 {cells}/uL (ref 200–950)
MPV: 9.6 fL (ref 7.5–12.5)
Monocytes Relative: 6 %
NEUTROS ABS: 4575 {cells}/uL (ref 1500–7800)
Neutrophils Relative %: 61 %
Platelets: 212 10*3/uL (ref 140–400)
RBC: 4.33 MIL/uL (ref 3.80–5.10)
RDW: 15 % (ref 11.0–15.0)
WBC: 7.5 10*3/uL (ref 3.8–10.8)

## 2017-06-15 MED ORDER — TRIAMCINOLONE ACETONIDE 40 MG/ML IJ SUSP
40.0000 mg | INTRAMUSCULAR | Status: AC | PRN
  Administered 2017-06-15: 40 mg via INTRA_ARTICULAR

## 2017-06-15 MED ORDER — LIDOCAINE HCL 2 % IJ SOLN
2.0000 mL | INTRAMUSCULAR | Status: AC | PRN
  Administered 2017-06-15: 2 mL

## 2017-06-15 MED ORDER — HYDROXYCHLOROQUINE SULFATE 200 MG PO TABS: 200.0000 mg | ORAL_TABLET | Freq: Two times a day (BID) | ORAL | 1 refills | Status: DC

## 2017-06-15 NOTE — Progress Notes (Signed)
Office Visit Note  Patient: Caroline Mason             Date of Birth: [birth year]           MRN: [id number]             PCP: Curlene Labrum, MD Referring: Curlene Labrum, MD Visit Date: 06/15/2017 Occupation: @GUAROCC @    Subjective:  Fatigue   History of Present Illness: KHOLE ARTERBURN is a 61 y.o. female   History of rheumatoid arthritis and fibromyalgia. On Plaquenil 200 twice a day  Doing about the same w/ above (fms and RA) Working 15 hrs / day  Gripping is hard due with CTS sysmptoms in left wrist. Getting numb.     Activities of Daily Living:  Patient reports morning stiffness for 60 minutes.   Patient Reports nocturnal pain.  Difficulty dressing/grooming: Denies Difficulty climbing stairs: Reports Difficulty getting out of chair: Reports Difficulty using hands for taps, buttons, cutlery, and/or writing: Reports   Review of Systems  Constitutional: Positive for fatigue.  HENT: Negative for mouth sores and mouth dryness.   Eyes: Negative for dryness.  Respiratory: Negative for shortness of breath.   Gastrointestinal: Negative for constipation and diarrhea.  Musculoskeletal: Positive for myalgias and myalgias.  Skin: Negative for sensitivity to sunlight.  Psychiatric/Behavioral: Positive for sleep disturbance. Negative for decreased concentration.    PMFS History:  Patient Active Problem List   Diagnosis Date Noted  . High risk medication use 12/01/2016  . Rheumatoid arthritis with rheumatoid factor of multiple sites without organ or systems involvement ([place]) 12/01/2016  . Fibromyalgia 12/01/2016  . Primary osteoarthritis of both hands 12/01/2016  . Primary osteoarthritis of both knees 12/01/2016  . Primary osteoarthritis of both feet 12/01/2016  . Bradycardia 07/18/2011  . Chronic systolic heart failure ([place]) 06/05/2011  . ANEMIA 03/01/2011  . TOBACCO ABUSE 01/03/2011  . OBSTRUCTIVE SLEEP APNEA 01/03/2011  . ESSENTIAL HYPERTENSION,  BENIGN 01/03/2011  . Secondary cardiomyopathy ([place]) 01/03/2011  . COPD 01/03/2011    Past Medical History:  Diagnosis Date  . Atypical pneumonia    12/11  . Cardiomyopathy    Nonischemic, normal coronaries, LVEF 40-45%  . COPD (chronic obstructive pulmonary disease) ([place])   . Depression   . Essential hypertension, benign   . Fibromyalgia   . Morbid obesity ([place])   . Obstructive sleep apnea   . Rheumatoid arthritis(714.0)     Family History  Problem Relation Age of Onset  . Stroke Mother   . Hypertension Mother   . Heart failure Sister 59  . Breast cancer Sister   . Heart failure Sister 60  . Cancer Sister   . Ovarian cancer Sister   . Diabetes Maternal Grandmother   . Diabetes Paternal Grandfather    Past Surgical History:  Procedure Laterality Date  . CYST REMOVAL TRUNK    . TUBAL LIGATION  1989  . WRIST SURGERY  2016   Social History   Social History Narrative   Lives in [place].     Objective: Vital Signs: BP 138/80   Pulse 66   Resp 14   Ht 5' 6"  (1.676 m)   Wt 228 lb (103.4 kg)   BMI 36.80 kg/m    Physical Exam  Constitutional: She is oriented to person, place, and time. She appears well-developed and well-nourished.  HENT:  Head: Normocephalic and atraumatic.  Eyes: EOM are normal. Pupils are equal, round, and reactive to light.  Cardiovascular: Normal rate, regular  rhythm and normal heart sounds.  Exam reveals no gallop and no friction rub.   No murmur heard. Pulmonary/Chest: Effort normal and breath sounds normal. She has no wheezes. She has no rales.  Abdominal: Soft. Bowel sounds are normal. She exhibits no distension. There is no tenderness. There is no guarding. No hernia.  Musculoskeletal: Normal range of motion. She exhibits no edema, tenderness or deformity.  Lymphadenopathy:    She has no cervical adenopathy.  Neurological: She is alert and oriented to person, place, and time. Coordination normal.  Skin: Skin is warm and dry.  Capillary refill takes less than 2 seconds. No rash noted.  Psychiatric: She has a normal mood and affect. Her behavior is normal.  Nursing note and vitals reviewed.    Musculoskeletal Exam:  Full range of motion of all joints Grip strength is equal and strong bilaterally For myalgia tender points are 6 out of 18 positive (bilateral trapezius muscle, greater trochanter, SI joint  CDAI Exam: CDAI Homunculus Exam:   Joint Counts:  CDAI Tender Joint count: 0 CDAI Swollen Joint count: 0  Global Assessments:  Patient Global Assessment: 6 Provider Global Assessment: 6  CDAI Calculated Score: 12    Investigation: No additional findings.  No visits with results within 6 Month(s) from this visit.  Latest known visit with results is:  Office Visit on 12/05/2016  Component Date Value Ref Range Status  . WBC 12/05/2016 7.2  3.8 - 10.8 K/uL Final  . RBC 12/05/2016 4.66  3.80 - 5.10 MIL/uL Final  . Hemoglobin 12/05/2016 13.1  11.7 - 15.5 g/dL Final  . HCT 12/05/2016 40.7  35.0 - 45.0 % Final  . MCV 12/05/2016 87.3  80.0 - 100.0 fL Final  . MCH 12/05/2016 28.1  27.0 - 33.0 pg Final  . MCHC 12/05/2016 32.2  32.0 - 36.0 g/dL Final  . RDW 12/05/2016 14.1  11.0 - 15.0 % Final  . Platelets 12/05/2016 238  140 - 400 K/uL Final  . MPV 12/05/2016 8.8  7.5 - 12.5 fL Final  . Neutro Abs 12/05/2016 4248  1,500 - 7,800 cells/uL Final  . Lymphs Abs 12/05/2016 2520  850 - 3,900 cells/uL Final  . Monocytes Absolute 12/05/2016 360  200 - 950 cells/uL Final  . Eosinophils Absolute 12/05/2016 72  15 - 500 cells/uL Final  . Basophils Absolute 12/05/2016 0  0 - 200 cells/uL Final  . Neutrophils Relative % 12/05/2016 59  % Final  . Lymphocytes Relative 12/05/2016 35  % Final  . Monocytes Relative 12/05/2016 5  % Final  . Eosinophils Relative 12/05/2016 1  % Final  . Basophils Relative 12/05/2016 0  % Final  . Smear Review 12/05/2016 Criteria for review not met   Final  . Sodium 12/05/2016 142  135 -  146 mmol/L Final  . Potassium 12/05/2016 4.2  3.5 - 5.3 mmol/L Final  . Chloride 12/05/2016 104  98 - 110 mmol/L Final  . CO2 12/05/2016 28  20 - 31 mmol/L Final  . Glucose, Bld 12/05/2016 106* 65 - 99 mg/dL Final  . BUN 12/05/2016 11  7 - 25 mg/dL Final  . Creat 12/05/2016 0.73  0.50 - 1.05 mg/dL Final   Comment:   For patients > or = 59 years of age: The upper reference limit for Creatinine is approximately 13% higher for people identified as African-American.     . Total Bilirubin 12/05/2016 0.7  0.2 - 1.2 mg/dL Final  . Alkaline Phosphatase 12/05/2016 102  33 -  130 U/L Final  . AST 12/05/2016 16  10 - 35 U/L Final  . ALT 12/05/2016 13  6 - 29 U/L Final  . Total Protein 12/05/2016 6.8  6.1 - 8.1 g/dL Final  . Albumin 12/05/2016 4.0  3.6 - 5.1 g/dL Final  . Calcium 12/05/2016 9.1  8.6 - 10.4 mg/dL Final  . GFR, Est African American 12/05/2016 >89  >=60 mL/min Final  . GFR, Est Non African American 12/05/2016 >89  >=60 mL/min Final      Imaging: No results found.  Speciality Comments: No specialty comments available.    Procedures:  Large Joint Inj Date/Time: 06/15/2017 9:45 AM Performed by: Eliezer Lofts Authorized by: Eliezer Lofts   Consent Given by:  Patient Site marked: the procedure site was marked   Timeout: prior to procedure the correct patient, procedure, and site was verified   Indications:  Pain Location:  Hip Site:  L hip joint Prep: patient was prepped and draped in usual sterile fashion   Needle Size:  27 G Needle Length:  1.5 inches Approach:  Superior Ultrasound Guidance: No   Fluoroscopic Guidance: No   Arthrogram: No   Medications:  40 mg triamcinolone acetonide 40 MG/ML; 2 mL lidocaine 2 % Aspiration Attempted: Yes   Aspirate amount (mL):  0  PATIENT TOLERATED PROCEDURE WELL. THERE WERE NO COMPLICATIONS.     Allergies: Aspirin   Assessment / Plan:     Visit Diagnoses: Rheumatoid arthritis with rheumatoid factor of multiple sites  without organ or systems involvement ([place])  High risk medication use - Plan: CBC with Differential/Platelet  Fibromyalgia   Plan: Rheumatoid arthritis with rheumatoid factor positive Patient is doing well with her Plaquenil. Last Plaquenil eye exam was normal July 2017 and patient is an appointment in July 2018 for repeat eye exam I will give her the Plaquenil eye exam form   #2: High risk prescription On Plaquenil 20 mg twice a day We will refill her medication today and give her 90 day supply with a refill  #3: Left SI joint pain Pain radiates down to her left buttock. Please see procedure notes for full details. 2% of lidocaine without epinephrine +40 mg of Kenalog was injected without complication. Patient tolerated procedure well.  #4: Left carpal tunnel syndrome. Numbness and tingling to the first digit. Wakes patient up at night Patient has had right carpal tunnel surgery by Dr. Durward Fortes and I've asked her to make an appointment for the current symptoms in the left carpal tunnel. She orally he has a brace. Patient just recently started working in April 2018 and this is probably exacerbating her symptoms. She also notes that the right carpal tunnel is starting to be slightly uncomfortable currently  #5: Return to clinic in 6 months  #6: Follow-up with Dr. Durward Fortes for left carpal tunnel syndrome Lead him another to having numbness and tingling especially Thumb And the symptoms waking up at night  #7: Right wrist already had carpal tunnel surgery but it's starting to flare up again (discussed this with Dr. Durward Fortes also).  #8: Next blood draw is due again in 3 months: She will need CBC with differential and CMP with GFR at that time  (she already had CMP done at her PCPs office in [place] in March; and we did CBC with differential today in our office)  Orders: Orders Placed This Encounter  Procedures  . Large Joint Injection/Arthrocentesis  . CBC with  Differential/Platelet   Meds ordered this encounter  Medications  .  hydroxychloroquine (PLAQUENIL) 200 MG tablet    Sig: Take 1 tablet (200 mg total) by mouth 2 (two) times daily.    Dispense:  180 tablet    Refill:  1    Order Specific Question:   Supervising Provider    Answer:   Bo Merino [phone]    Face-to-face time spent with patient was 30 minutes. 50% of time was spent in counseling and coordination of care.  Follow-Up Instructions: Return in about 5 months (around 11/15/2017) for FMS, [place], left cts - see [place].   Eliezer Lofts, PA-C  I examined and evaluated the patient with Eliezer Lofts PA. The plan of care was discussed as noted above.  Bo Merino, MD Note - This record has been created using Editor, commissioning.  Chart creation errors have been sought, but may not always  have been located. Such creation errors do not reflect on  the standard of medical care.

## 2017-06-15 NOTE — Patient Instructions (Addendum)
  Return to clinic in 6 months  Follow-up with Dr. Durward Fortes for left carpal tunnel syndrome Lead him another to having numbness and tingling especially Thumb And the symptoms waking up at night  Right wrist already had carpal tunnel surgery but it's starting to flare up again (discussed this with Dr. Durward Fortes also).  Next blood draw is due again in 3 months: She will need CBC with differential and CMP with GFR at that time  (she already had CMP done at her PCPs office in [place] in March; and we did CBC with differential today in our office)    ======================================== New fax number is [phone] ========================================

## 2017-06-18 NOTE — Progress Notes (Signed)
wnl

## 2017-06-20 ENCOUNTER — Telehealth: Payer: Self-pay | Admitting: Rheumatology

## 2017-06-20 ENCOUNTER — Ambulatory Visit (INDEPENDENT_AMBULATORY_CARE_PROVIDER_SITE_OTHER): Payer: Medicare Other | Admitting: Orthopaedic Surgery

## 2017-06-20 ENCOUNTER — Encounter (INDEPENDENT_AMBULATORY_CARE_PROVIDER_SITE_OTHER): Payer: Self-pay | Admitting: Orthopaedic Surgery

## 2017-06-20 VITALS — BP 128/72 | HR 61 | Resp 14 | Ht 68.0 in | Wt 225.0 lb

## 2017-06-20 DIAGNOSIS — G5602 Carpal tunnel syndrome, left upper limb: Secondary | ICD-10-CM | POA: Diagnosis not present

## 2017-06-20 MED ORDER — METHYLPREDNISOLONE ACETATE 40 MG/ML IJ SUSP
20.0000 mg | INTRAMUSCULAR | Status: AC | PRN
  Administered 2017-06-20: 20 mg

## 2017-06-20 MED ORDER — LIDOCAINE HCL 1 % IJ SOLN
1.0000 mL | INTRAMUSCULAR | Status: AC | PRN
  Administered 2017-06-20: 1 mL

## 2017-06-20 NOTE — Telephone Encounter (Signed)
Patient returned your call.    Thank you.

## 2017-06-20 NOTE — Telephone Encounter (Signed)
Left message to advise patient lab results are normal.  

## 2017-06-20 NOTE — Progress Notes (Signed)
Office Visit Note   Patient: Caroline Mason           Date of Birth: 03-08-1956           MRN: [id number] Visit Date: 06/20/2017              Requested by: Curlene Labrum, MD [place], [place] [id number] PCP: Curlene Labrum, MD   Assessment & Plan: Visit Diagnoses:  1. Carpal tunnel syndrome, left upper limb     Plan: Long discussion regarding diagnosis. Mrs. Caroline Mason would prefer to try a cortisone injection in the left carpal canal before considering surgery. Will inject left carpal canal and monitor her response. Continue wearing the splint at night as needed Follow-Up Instructions: Return if symptoms worsen or fail to improve.   Orders:  No orders of the defined types were placed in this encounter.  No orders of the defined types were placed in this encounter.     Procedures: Hand/UE Inj Date/Time: 06/20/2017 12:11 PM Performed by: Garald Balding Authorized by: Garald Balding   Consent Given by:  Parent Timeout: prior to procedure the correct patient, procedure, and site was verified   Indications:  Therapeutic Condition: carpal tunnel   Site:  L carpal tunnel Needle Size:  27 G Approach:  [place] Medications:  1 mL lidocaine 1 %; 20 mg methylPREDNISolone acetate 40 MG/ML Patient tolerance:  Patient tolerated the procedure well with no immediate complications     Clinical Data: No additional findings.   Subjective: Chief Complaint  Patient presents with  . Left Hand - Pain, Weakness, Numbness    Ms. Caroline Mason is a 61 y o that presents with Left hand pain, numbness, thumb and index finger with a hx of CTS and triggering  Mrs. Caroline Mason is been complaining of left hand numbness and tingling particularly the thumb and index finger for "several months. She started working at a job in April where she is doing a lot of repetitive activities. Her past history is significant that she had a right carpal tunnel release several years ago and is done quite  well. EMGs and nerve conduction studies performed 2016 demonstrated evidence of moderate to severe bilateral median nerve entrapment at the wrist. She feels like the numbness and tingling are now spreading to the long and ring finger of the same hand. In addition she's had some early triggering of the left thumb.  HPI  Review of Systems   Objective: Vital Signs: BP 128/72   Pulse 61   Resp 14   Ht 5\' 8"  (1.727 m)   Wt 225 lb (102.1 kg)   BMI 34.21 kg/m   Physical Exam  Ortho Exam mild pain over the flexor tendons the level of the metacarpal phalangeal joint dorsally of the left thumb. No active triggering. Neurovascular exam intact. Mild Tinel's over the median nerve at the wrist. Mild positive Phalen's same wrist. Good opposition of thumb to little finger without obvious thenar atrophy. Skin intact. Good pulses.  Specialty Comments:  No specialty comments available.  Imaging: No results found.   PMFS History: Patient Active Problem List   Diagnosis Date Noted  . High risk medication use 12/01/2016  . Rheumatoid arthritis with rheumatoid factor of multiple sites without organ or systems involvement ([place]) 12/01/2016  . Fibromyalgia 12/01/2016  . Primary osteoarthritis of both hands 12/01/2016  . Primary osteoarthritis of both knees 12/01/2016  . Primary osteoarthritis of both feet 12/01/2016  . Bradycardia 07/18/2011  .  Chronic systolic heart failure ([place]) 06/05/2011  . ANEMIA 03/01/2011  . TOBACCO ABUSE 01/03/2011  . OBSTRUCTIVE SLEEP APNEA 01/03/2011  . ESSENTIAL HYPERTENSION, BENIGN 01/03/2011  . Secondary cardiomyopathy ([place]) 01/03/2011  . COPD 01/03/2011   Past Medical History:  Diagnosis Date  . Atypical pneumonia    12/11  . Cardiomyopathy    Nonischemic, normal coronaries, LVEF 40-45%  . COPD (chronic obstructive pulmonary disease) ([place])   . Depression   . Essential hypertension, benign   . Fibromyalgia   . Morbid obesity ([place])   . Obstructive sleep  apnea   . Rheumatoid arthritis(714.0)     Family History  Problem Relation Age of Onset  . Stroke Mother   . Hypertension Mother   . Heart failure Sister 20  . Breast cancer Sister   . Heart failure Sister 29  . Cancer Sister   . Ovarian cancer Sister   . Diabetes Maternal Grandmother   . Diabetes Paternal Grandfather     Past Surgical History:  Procedure Laterality Date  . CYST REMOVAL TRUNK    . TUBAL LIGATION  1989  . WRIST SURGERY  2016   Social History   Occupational History  . [place] History Main Topics  . Smoking status: Current Some Day Smoker    Packs/day: 0.30    Years: 30.00    Types: Cigarettes    Start date: 06/03/1976  . Smokeless tobacco: Never Used     Comment: smokes 3-4 per day again  . Alcohol use No  . Drug use: No  . Sexual activity: Yes    Birth control/ protection: None     Garald Balding, MD   Note - This record has been created using [employer].  Chart creation errors have been sought, but may not always  have been located. Such creation errors do not reflect on  the standard of medical care.

## 2017-07-11 ENCOUNTER — Encounter: Payer: Medicare Other | Attending: Family Medicine | Admitting: Nutrition

## 2017-07-11 VITALS — Ht 66.0 in | Wt 226.0 lb

## 2017-07-11 DIAGNOSIS — R7303 Prediabetes: Secondary | ICD-10-CM | POA: Insufficient documentation

## 2017-07-11 DIAGNOSIS — E669 Obesity, unspecified: Secondary | ICD-10-CM | POA: Insufficient documentation

## 2017-07-11 DIAGNOSIS — I5020 Unspecified systolic (congestive) heart failure: Secondary | ICD-10-CM | POA: Insufficient documentation

## 2017-07-11 DIAGNOSIS — Z713 Dietary counseling and surveillance: Secondary | ICD-10-CM | POA: Insufficient documentation

## 2017-07-11 DIAGNOSIS — I10 Essential (primary) hypertension: Secondary | ICD-10-CM | POA: Insufficient documentation

## 2017-07-11 DIAGNOSIS — E782 Mixed hyperlipidemia: Secondary | ICD-10-CM | POA: Insufficient documentation

## 2017-07-11 NOTE — Progress Notes (Signed)
Medical Nutrition Therapy:  Appt start time: 3846  end time: 1030  Assessment:  Primary concerns today: Prediabetes and obesity. Lost 6 lbs in last three months.. Changed: Being more active with walking.. Drinking more water..  Now has a part time job and it's keeping her busy.     Diet need more fruit and low carb vegetables daily. Has cut out snacks and watching her portions better. No new labs.    Wt Readings from Last 3 Encounters:  07/11/17 226 lb (102.5 kg)  06/20/17 225 lb (102.1 kg)  06/15/17 228 lb (103.4 kg)   Ht Readings from Last 3 Encounters:  07/11/17 5\' 6"  (1.676 m)  06/20/17 5\' 8"  (1.727 m)  06/15/17 5\' 6"  (1.676 m)   Body mass index is 36.48 kg/m.   Preferred Learning Style:   No preference indicated   Learning Readiness:  Ready  Change in progress   MEDICATIONS: See lis   DIETARY INTAKE:    24-hr recall:  B ( AM): Eggs and toast, water, or oatmeal nk ( AM): none  L ( PM): Small hamburger with bun and tomatoes, water Snk ( PM): yogurt  D ( PM): Chicken, potatoes, slaw and gravy, water Beverages: water, Usual physical activity: aerobics exercises  Estimated energy needs: 1500 calories 170 g carbohydrates 112 g protein 42 g fat  Progress Towards Goal(s):  In progress.   Nutritional Diagnosis:  NB-1.1 Food and nutrition-related knowledge deficit As related to Prediabetes and morbid obesity.  As evidenced by A1CD 5.8 and BMI > 40..    Intervention:  Nutrition and Diabetes education provided on My Plate, CHO counting, meal planning, portion sizes, timing of meals, avoiding snacks between meals unless having a low blood sugar, target ranges for A1C and blood sugars, signs/symptoms and treatment of hyper/hypoglycemia, monitoring blood sugars, taking medications as prescribed, benefits of exercising 30 minutes per day and prevention of complications of DM.   Goals Keep up the great job! Increase more vegetable with lunch and more fruit with lunch  and dinner Keep drinking water Keep exercising 30-45 minutes. Avoid processed foods and prepare meals at home.  Teaching Method Utilized: Visual Auditory Hands on  Handouts given during visit include:  The Plate Method  Meal Plan Card  Diabetes Instructions.   Barriers to learning/adherence to lifestyle change:  None  Demonstrated degree of understanding via:  Teach Back   Monitoring/Evaluation:  Dietary intake, exercise, meal planning  and body weight in 3-4 months .

## 2017-07-11 NOTE — Patient Instructions (Signed)
Goals Keep up the great job! Increase more vegetable with lunch and more fruit with lunch and dinner Keep drinking water Keep exercising 30-45 minutes. Avoid processed foods and prepare meals at home.

## 2017-08-03 DIAGNOSIS — Z79899 Other long term (current) drug therapy: Secondary | ICD-10-CM | POA: Diagnosis not present

## 2017-08-03 DIAGNOSIS — M059 Rheumatoid arthritis with rheumatoid factor, unspecified: Secondary | ICD-10-CM | POA: Diagnosis not present

## 2017-08-03 DIAGNOSIS — Z09 Encounter for follow-up examination after completed treatment for conditions other than malignant neoplasm: Secondary | ICD-10-CM | POA: Diagnosis not present

## 2017-08-03 DIAGNOSIS — Z049 Encounter for examination and observation for unspecified reason: Secondary | ICD-10-CM | POA: Diagnosis not present

## 2017-08-07 ENCOUNTER — Telehealth: Payer: Self-pay | Admitting: Radiology

## 2017-08-07 NOTE — Telephone Encounter (Signed)
Normal eye exam received 08/03/17 / have sent for scanning

## 2017-08-24 ENCOUNTER — Other Ambulatory Visit: Payer: Self-pay | Admitting: Cardiology

## 2017-10-01 DIAGNOSIS — I5022 Chronic systolic (congestive) heart failure: Secondary | ICD-10-CM | POA: Diagnosis not present

## 2017-10-01 DIAGNOSIS — Z0001 Encounter for general adult medical examination with abnormal findings: Secondary | ICD-10-CM | POA: Diagnosis not present

## 2017-10-04 ENCOUNTER — Telehealth: Payer: Self-pay | Admitting: Nutrition

## 2017-10-04 NOTE — Telephone Encounter (Signed)
VM to call and reschedule appt due to meeting

## 2017-10-11 ENCOUNTER — Ambulatory Visit: Payer: Self-pay | Admitting: Nutrition

## 2017-10-16 DIAGNOSIS — Z1231 Encounter for screening mammogram for malignant neoplasm of breast: Secondary | ICD-10-CM | POA: Diagnosis not present

## 2017-10-24 ENCOUNTER — Ambulatory Visit: Payer: Self-pay | Admitting: Nutrition

## 2017-10-31 ENCOUNTER — Other Ambulatory Visit: Payer: Self-pay | Admitting: Family Medicine

## 2017-10-31 DIAGNOSIS — R928 Other abnormal and inconclusive findings on diagnostic imaging of breast: Secondary | ICD-10-CM | POA: Diagnosis not present

## 2017-10-31 DIAGNOSIS — R921 Mammographic calcification found on diagnostic imaging of breast: Secondary | ICD-10-CM | POA: Diagnosis not present

## 2017-10-31 DIAGNOSIS — R920 Mammographic microcalcification found on diagnostic imaging of breast: Secondary | ICD-10-CM | POA: Diagnosis not present

## 2017-11-05 ENCOUNTER — Ambulatory Visit
Admission: RE | Admit: 2017-11-05 | Discharge: 2017-11-05 | Disposition: A | Discharge status: Home / Self Care | Payer: Medicare Other | Source: Ambulatory Visit | Attending: Family Medicine | Admitting: Family Medicine

## 2017-11-05 DIAGNOSIS — R921 Mammographic calcification found on diagnostic imaging of breast: Secondary | ICD-10-CM

## 2017-11-05 DIAGNOSIS — N6011 Diffuse cystic mastopathy of right breast: Secondary | ICD-10-CM | POA: Diagnosis not present

## 2017-11-13 NOTE — Progress Notes (Signed)
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 female with history of rheumatoid arthritis, osteoarthritis and fibromyalgia syndrome. She states she's been having some numbness in her left hand. She had right carpal tunnel release in the past. She states she'll be scheduling left carpal tunnel release and near future. She's been also experiencing some discomfort in her hands. She states with the cold weather the generalized pain has increased. She developed a rash all over her body last month. She states after using some topical agents over-the-counter her rash gradually resolved. She had her eye exam recently. She's also noticed some redness in her eyes. She's been having a lot of discomfort in her bilateral knee joints. She describes nocturnal pain in her knee joints. Her fibromyalgia is flaring as well.  Activities of Daily Living:  Patient reports morning stiffness for 30 minutes.   Patient Reports nocturnal pain.  Difficulty dressing/grooming: Denies Difficulty climbing stairs: Reports Difficulty getting out of chair: Reports Difficulty using hands for taps, buttons, cutlery, and/or writing: Reports   Review of Systems  Constitutional: Positive for fatigue. Negative for night sweats, weight gain, weight loss and weakness.  HENT: Negative.  Negative for mouth sores, trouble swallowing, trouble swallowing, mouth dryness and nose dryness.   Eyes: Positive for dryness. Negative for pain, redness and visual disturbance.  Respiratory: Negative.  Negative for cough, shortness of breath and difficulty breathing.   Cardiovascular: Negative.   Negative for chest pain, palpitations, hypertension, irregular heartbeat and swelling in legs/feet.  Gastrointestinal: Negative.  Negative for blood in stool, constipation and diarrhea.  Endocrine: Negative for increased urination.  Genitourinary: Negative for vaginal dryness.  Musculoskeletal: Positive for arthralgias, joint pain, joint swelling, myalgias, morning stiffness and myalgias. Negative for muscle weakness and muscle tenderness.  Skin: Positive for rash. Negative for color change, hair loss, skin tightness, ulcers and sensitivity to sunlight.  Allergic/Immunologic: Negative for susceptible to infections.  Neurological: Positive for numbness. Negative for dizziness, headaches, memory loss and night sweats.  Hematological: Negative for swollen glands.  Psychiatric/Behavioral: Positive for sleep disturbance. Negative for depressed mood. The patient is not nervous/anxious.     PMFS History:  Patient Active Problem List   Diagnosis Date Noted  . High risk medication use 12/01/2016  . Rheumatoid arthritis with rheumatoid factor of multiple sites without organ or systems involvement ([place]) 12/01/2016  . Fibromyalgia 12/01/2016  . Primary osteoarthritis of both hands 12/01/2016  . Primary osteoarthritis of both knees 12/01/2016  . Primary osteoarthritis of both feet 12/01/2016  . Bradycardia 07/18/2011  . Chronic systolic heart failure ([place]) 06/05/2011  . ANEMIA 03/01/2011  . TOBACCO ABUSE 01/03/2011  . OBSTRUCTIVE SLEEP APNEA 01/03/2011  . ESSENTIAL HYPERTENSION, BENIGN 01/03/2011  . Secondary cardiomyopathy ([place]) 01/03/2011  . COPD 01/03/2011    Past Medical History:  Diagnosis Date  . Atypical pneumonia    12/11  . Cardiomyopathy    Nonischemic, normal coronaries, LVEF 40-45%  . COPD (chronic obstructive pulmonary disease) ([place])   .  Depression   . Essential hypertension, benign   . Fibromyalgia   . Morbid obesity ([place])   . Obstructive sleep apnea   . Rheumatoid  arthritis(714.0)     Family History  Problem Relation Age of Onset  . Stroke Mother   . Hypertension Mother   . Heart failure Sister 23  . Breast cancer Sister   . Heart failure Sister 29  . Cancer Sister   . Ovarian cancer Sister   . Diabetes Maternal Grandmother   . Diabetes Paternal Grandfather    Past Surgical History:  Procedure Laterality Date  . CYST REMOVAL TRUNK    . TUBAL LIGATION  1989  . WRIST SURGERY  2016   Social History   Social History Narrative   Lives in [place].     Objective: Vital Signs: BP 134/66 (BP Location: Left Arm, Patient Position: Sitting, Cuff Size: Normal)   Pulse 65   Ht 5\' 6"  (1.676 m)   Wt 230 lb (104.3 kg)   BMI 37.12 kg/m    Physical Exam  Constitutional: She is oriented to person, place, and time. She appears well-developed and well-nourished.  HENT:  Head: Normocephalic and atraumatic.  Eyes: Conjunctivae and EOM are normal.  Neck: Normal range of motion.  Cardiovascular: Normal rate, regular rhythm, normal heart sounds and intact distal pulses.  Pulmonary/Chest: Effort normal and breath sounds normal.  Abdominal: Soft. Bowel sounds are normal.  Lymphadenopathy:    She has no cervical adenopathy.  Neurological: She is alert and oriented to person, place, and time.  Skin: Skin is warm and dry. Capillary refill takes less than 2 seconds.  Psychiatric: She has a normal mood and affect. Her behavior is normal.  Nursing note and vitals reviewed.    Musculoskeletal Exam: C-spine and thoracic and lumbar spine limited range of motion with some discomfort. Shoulder joints elbow joints wrist joints MCPs PIPs DIPs are good range of motion with no synovitis. Although she describes tenderness over MCPs and PIP joints. Hip joints knee joints ankles MTPs PIPs with good range of motion. She had discomfort range of motion of bilateral knee joints without any warmth swelling or effusion. She had MTP tenderness without any synovitis.  CDAI  Exam: CDAI Homunculus Exam:   Tenderness:  RLE: tibiofemoral LLE: tibiofemoral  Joint Counts:  CDAI Tender Joint count: 2 CDAI Swollen Joint count: 0  Global Assessments:  Patient Global Assessment: 5 Provider Global Assessment: 3  CDAI Calculated Score: 10    Investigation: No additional findings.PLQ eye exam: 08/03/2017 CBC Latest Ref Rng & Units 06/15/2017 12/05/2016  WBC 3.8 - 10.8 K/uL 7.5 7.2  Hemoglobin 11.7 - 15.5 g/dL 13.1 13.1  Hematocrit 35.0 - 45.0 % 37.5 40.7  Platelets 140 - 400 K/uL 212 238   CMP Latest Ref Rng & Units 12/05/2016  Glucose 65 - 99 mg/dL 106(H)  BUN 7 - 25 mg/dL 11  Creatinine 0.50 - 1.05 mg/dL 0.73  Sodium 135 - 146 mmol/L 142  Potassium 3.5 - 5.3 mmol/L 4.2  Chloride 98 - 110 mmol/L 104  CO2 20 - 31 mmol/L 28  Calcium 8.6 - 10.4 mg/dL 9.1  Total Protein 6.1 - 8.1 g/dL 6.8  Total Bilirubin 0.2 - 1.2 mg/dL 0.7  Alkaline Phos 33 - 130 U/L 102  AST 10 - 35 U/L 16  ALT 6 - 29 U/L 13    Imaging: Mm Clip Placement Right  Result Date: 11/05/2017 CLINICAL DATA:  Status post stereotactic core biopsy of right breast  calcifications EXAM: DIAGNOSTIC RIGHT MAMMOGRAM POST STEREOTACTIC BIOPSY COMPARISON:  Previous exam(s). FINDINGS: Mammographic images were obtained following right breast stereotactic guided biopsy of calcifications in the upper-outer quadrant. Cc and lateral views of the right breast demonstrate core biopsy clip in the area of concern. IMPRESSION: Post biopsy mammogram demonstrating biopsy clip in the area of concern. Final Assessment: Post Procedure Mammograms for Marker Placement Electronically Signed   By: Abelardo Diesel M.D.   On: 11/05/2017 09:36   Xr Foot 2 Views Left  Result Date: 11/23/2017 First MTP narrowing and PIP/DIP narrowing was noted. No other MTP joint narrowing or erosive changes were noted. No intertarsal joint space narrowing was noted. Inferior and posterior calcaneal spurs were noted. Impression: These findings  are consistent with osteoarthritis of the foot.  Xr Foot 2 Views Right  Result Date: 11/23/2017 First MTP narrowing and PIP/DIP narrowing was noted. No other MTP joint narrowing or erosive changes were noted. No intertarsal joint space narrowing was noted. Inferior and posterior calcaneal spurs were noted. Impression: These findings are consistent with osteoarthritis of the foot.  Xr Hand 2 View Left  Result Date: 11/23/2017 PIP/DIP narrowing was noted. Subluxation of fifth finger DIP joints was noted. No MCP joint narrowing was noted. No intercarpal radio Joint space narrowing was noted. No erosive changes were noted. Impression: Findings are consistent with mild osteoarthritis.  Xr Hand 2 View Right  Result Date: 11/23/2017 PIP/DIP narrowing was noted. No MCP joint narrowing was noted. No intercarpal radio Joint space narrowing was noted. No erosive changes were noted. Impression: Findings are consistent with mild osteoarthritis.  Xr Knee 3 View Left  Result Date: 11/23/2017 Severe medial compartment narrowing was noted. Medial and lateral osteophytes and intercondylar osteophytes were noted. No chondrocalcinosis was noted. Moderate patellofemoral narrowing was noted. These findings are consistent with severe osteoarthritis of knee joint and moderate chondromalacia patella.  Xr Knee 3 View Right  Result Date: 11/23/2017 Severe medial compartment narrowing was noted. Medial and lateral osteophytes and intercondylar osteophytes were noted. No chondrocalcinosis was noted. No patellofemoral narrowing was noted. These findings are consistent with severe osteoarthritis of knee joint.  Mm Rt Breast Bx W Loc Dev 1st Lesion Image Bx Spec Stereo Guide  Addendum Date: 11/06/2017   ADDENDUM REPORT: 11/06/2017 15:53 ADDENDUM: Pathology revealed FIBROCYSTIC CHANGES WITH CALCIFICATIONS of the Right breast, upper outer quadrant. This was found to be concordant by Dr. Abelardo Diesel. Pathology results  were discussed with the patient by telephone. The patient reported doing well after the biopsy with tenderness at the site. Post biopsy instructions and care were reviewed and questions were answered. The patient was encouraged to call [facility] for any additional concerns. The patient was instructed to return for annual screening mammography at [facility] in [place], [place]. Pathology results reported by Terie Purser, RN on 11/06/2017. Electronically Signed   By: Abelardo Diesel M.D.   On: 11/06/2017 15:53   Result Date: 11/06/2017 CLINICAL DATA:  Right breast calcifications for biopsy. EXAM: RIGHT BREAST STEREOTACTIC CORE NEEDLE BIOPSY COMPARISON:  Previous exams. FINDINGS: The patient and I discussed the procedure of stereotactic-guided biopsy including benefits and alternatives. We discussed the high likelihood of a successful procedure. We discussed the risks of the procedure including infection, bleeding, tissue injury, clip migration, and inadequate sampling. Informed written consent was given. The usual time out protocol was performed immediately prior to the procedure. Using sterile technique and 1% Lidocaine as local anesthetic, under stereotactic guidance, a  9 gauge vacuum assisted device was used to perform core needle biopsy of calcifications in the upper outer quadrant of the right breast using a lateral approach. Specimen radiograph was performed showing inclusion a calcifications of concern. Specimens with calcifications are identified for pathology. Lesion quadrant: Upper-outer quadrant At the conclusion of the procedure, a coil tissue marker clip was deployed into the biopsy cavity. Follow-up 2-view mammogram was performed and dictated separately. IMPRESSION: Stereotactic-guided biopsy of right breast. No apparent complications. Electronically Signed: By: Abelardo Diesel M.D. On: 11/05/2017 09:35    Speciality Comments: No specialty comments  available.    Procedures:  No procedures performed Allergies: Aspirin   Assessment / Plan:     Visit Diagnoses: Rheumatoid arthritis with rheumatoid factor of multiple sites without organ or systems involvement (HCC) - +RF, synovitis . Patient has no synovitis on examination although she complains of increase arthralgias and pain.  High risk medication use - PLQ 200 mg by mouth twice a dayeye exam: 08/03/2017 - Plan: CBC with Differential/Platelet, COMPLETE METABOLIC PANEL WITH GFR, today and every 5 months. CBC with Differential/Platelet, COMPLETE METABOLIC PANEL WITH GFR  Primary osteoarthritis of both hands she does have some osteoarthritic changes in her hands.  Pain in both hands - Plan: XR Hand 2 View Right, XR Hand 2 View Left. X-rays revealed only osteoarthritic changes no erosive changes were noted.  Primary osteoarthritis of both knees - chondromalacia patella  Chronic pain of both knees -she complains of increased pain in her bilateral knee joints and difficulty with mobility. She's also having nocturnal pain. Plan: XR KNEE 3 VIEW RIGHT, XR KNEE 3 VIEW LEFT. She has severe osteoarthritis of bilateral knee joints. She will require total knee replacement at some point.  Primary osteoarthritis of both feet  Pain in both feet - Plan: XR Foot 2 Views Right, XR Foot 2 Views Left. X-rays revealed only of her 3 changes noted erosive changes were noted.  Fibromyalgia: She continues to have some generalized pain and discomfort from fibromyalgia.  History of vitamin D deficiency: she is on vitamin D supplement. RLS (restless legs syndrome)  Other medical bunions are listed as follows:  Other cardiomyopathy ([place])  History of anemia  History of COPD  History of sleep apnea    Orders: Orders Placed This Encounter  Procedures  . XR Hand 2 View Right  . XR Hand 2 View Left  . XR KNEE 3 VIEW RIGHT  . XR KNEE 3 VIEW LEFT  . XR Foot 2 Views Right  . XR Foot 2 Views Left  . CBC  with Differential/Platelet  . COMPLETE METABOLIC PANEL WITH GFR  . CBC with Differential/Platelet  . COMPLETE METABOLIC PANEL WITH GFR   No orders of the defined types were placed in this encounter.   Face-to-face time spent with patient was 30 minutes. Greater than 50% of time was spent in counseling and coordination of care.  Follow-Up Instructions: Return in about 5 months (around 04/23/2018) for Rheumatoid arthritis.   Bo Merino, MD  Note - This record has been created using Editor, commissioning.  Chart creation errors have been sought, but may not always  have been located. Such creation errors do not reflect on  the standard of medical care.

## 2017-11-23 ENCOUNTER — Ambulatory Visit (INDEPENDENT_AMBULATORY_CARE_PROVIDER_SITE_OTHER): Payer: Self-pay

## 2017-11-23 ENCOUNTER — Ambulatory Visit: Payer: Medicare Other | Admitting: Rheumatology

## 2017-11-23 ENCOUNTER — Encounter: Payer: Self-pay | Admitting: Rheumatology

## 2017-11-23 VITALS — BP 134/66 | HR 65 | Ht 66.0 in | Wt 230.0 lb

## 2017-11-23 DIAGNOSIS — Z862 Personal history of diseases of the blood and blood-forming organs and certain disorders involving the immune mechanism: Secondary | ICD-10-CM

## 2017-11-23 DIAGNOSIS — M0579 Rheumatoid arthritis with rheumatoid factor of multiple sites without organ or systems involvement: Secondary | ICD-10-CM

## 2017-11-23 DIAGNOSIS — M19042 Primary osteoarthritis, left hand: Secondary | ICD-10-CM | POA: Diagnosis not present

## 2017-11-23 DIAGNOSIS — M25561 Pain in right knee: Secondary | ICD-10-CM

## 2017-11-23 DIAGNOSIS — M19072 Primary osteoarthritis, left ankle and foot: Secondary | ICD-10-CM | POA: Diagnosis not present

## 2017-11-23 DIAGNOSIS — M79642 Pain in left hand: Secondary | ICD-10-CM

## 2017-11-23 DIAGNOSIS — M19041 Primary osteoarthritis, right hand: Secondary | ICD-10-CM | POA: Diagnosis not present

## 2017-11-23 DIAGNOSIS — I428 Other cardiomyopathies: Secondary | ICD-10-CM

## 2017-11-23 DIAGNOSIS — Z8709 Personal history of other diseases of the respiratory system: Secondary | ICD-10-CM

## 2017-11-23 DIAGNOSIS — Z79899 Other long term (current) drug therapy: Secondary | ICD-10-CM | POA: Diagnosis not present

## 2017-11-23 DIAGNOSIS — M25562 Pain in left knee: Secondary | ICD-10-CM | POA: Diagnosis not present

## 2017-11-23 DIAGNOSIS — M79671 Pain in right foot: Secondary | ICD-10-CM | POA: Diagnosis not present

## 2017-11-23 DIAGNOSIS — M79672 Pain in left foot: Secondary | ICD-10-CM

## 2017-11-23 DIAGNOSIS — M79641 Pain in right hand: Secondary | ICD-10-CM | POA: Diagnosis not present

## 2017-11-23 DIAGNOSIS — Z8669 Personal history of other diseases of the nervous system and sense organs: Secondary | ICD-10-CM

## 2017-11-23 DIAGNOSIS — G8929 Other chronic pain: Secondary | ICD-10-CM

## 2017-11-23 DIAGNOSIS — M170 Bilateral primary osteoarthritis of knee: Secondary | ICD-10-CM

## 2017-11-23 DIAGNOSIS — Z8639 Personal history of other endocrine, nutritional and metabolic disease: Secondary | ICD-10-CM

## 2017-11-23 DIAGNOSIS — M19071 Primary osteoarthritis, right ankle and foot: Secondary | ICD-10-CM | POA: Diagnosis not present

## 2017-11-23 DIAGNOSIS — M797 Fibromyalgia: Secondary | ICD-10-CM | POA: Diagnosis not present

## 2017-11-23 DIAGNOSIS — G2581 Restless legs syndrome: Secondary | ICD-10-CM

## 2017-11-23 NOTE — Patient Instructions (Signed)
Standing Labs We placed an order today for your standing lab work.    Please come back and get your standing labs in April and every 5 months  We have open lab Monday through Friday from 8:30-11:30 AM and 1:30-4 PM at the office of Dr. Bo Merino.   The office is located at [address] [place], [place], [place] [address] No appointment is necessary.   Labs are drawn by [facility].  You may receive a bill from [facility] for your lab work. If you have any questions regarding directions or hours of operation,  please call [phone].

## 2017-11-24 LAB — CBC WITH DIFFERENTIAL/PLATELET
BASOS ABS: 20 {cells}/uL (ref 0–200)
Basophils Relative: 0.3 %
EOS ABS: 79 {cells}/uL (ref 15–500)
Eosinophils Relative: 1.2 %
HEMATOCRIT: 40 % (ref 35.0–45.0)
HEMOGLOBIN: 13.3 g/dL (ref 11.7–15.5)
LYMPHS ABS: 1954 {cells}/uL (ref 850–3900)
MCH: 28.6 pg (ref 27.0–33.0)
MCHC: 33.3 g/dL (ref 32.0–36.0)
MCV: 86 fL (ref 80.0–100.0)
MPV: 9.3 fL (ref 7.5–12.5)
Monocytes Relative: 7.1 %
NEUTROS ABS: 4079 {cells}/uL (ref 1500–7800)
NEUTROS PCT: 61.8 %
Platelets: 258 10*3/uL (ref 140–400)
RBC: 4.65 10*6/uL (ref 3.80–5.10)
RDW: 12.7 % (ref 11.0–15.0)
Total Lymphocyte: 29.6 %
WBC mixed population: 469 cells/uL (ref 200–950)
WBC: 6.6 10*3/uL (ref 3.8–10.8)

## 2017-11-24 LAB — COMPLETE METABOLIC PANEL WITH GFR
AG RATIO: 1.5 (calc) (ref 1.0–2.5)
ALT: 9 U/L (ref 6–29)
AST: 12 U/L (ref 10–35)
Albumin: 4.3 g/dL (ref 3.6–5.1)
Alkaline phosphatase (APISO): 109 U/L (ref 33–130)
BUN: 12 mg/dL (ref 7–25)
CALCIUM: 9.3 mg/dL (ref 8.6–10.4)
CO2: 30 mmol/L (ref 20–32)
CREATININE: 0.71 mg/dL (ref 0.50–1.05)
Chloride: 105 mmol/L (ref 98–110)
GFR, EST AFRICAN AMERICAN: 108 mL/min/{1.73_m2} (ref >=60)
GFR, EST NON AFRICAN AMERICAN: 93 mL/min/{1.73_m2} (ref >=60)
GLOBULIN: 2.8 g/dL (ref 1.9–3.7)
Glucose, Bld: 103 mg/dL — ABNORMAL HIGH (ref 65–99)
POTASSIUM: 4.2 mmol/L (ref 3.5–5.3)
SODIUM: 142 mmol/L (ref 135–146)
TOTAL PROTEIN: 7.1 g/dL (ref 6.1–8.1)
Total Bilirubin: 0.8 mg/dL (ref 0.2–1.2)

## 2017-11-26 NOTE — Progress Notes (Signed)
WNL

## 2017-11-29 ENCOUNTER — Other Ambulatory Visit (INDEPENDENT_AMBULATORY_CARE_PROVIDER_SITE_OTHER): Payer: Self-pay

## 2017-11-29 NOTE — Progress Notes (Signed)
error 

## 2017-12-24 ENCOUNTER — Other Ambulatory Visit: Payer: Self-pay | Admitting: *Deleted

## 2017-12-24 MED ORDER — SPIRONOLACTONE 25 MG PO TABS: 25.0000 mg | ORAL_TABLET | Freq: Every day | ORAL | 0 refills | Status: DC

## 2018-02-01 ENCOUNTER — Other Ambulatory Visit: Payer: Self-pay | Admitting: *Deleted

## 2018-02-01 MED ORDER — SPIRONOLACTONE 25 MG PO TABS: 25.0000 mg | ORAL_TABLET | Freq: Every day | ORAL | 0 refills | Status: DC

## 2018-03-27 ENCOUNTER — Ambulatory Visit (INDEPENDENT_AMBULATORY_CARE_PROVIDER_SITE_OTHER): Payer: Medicare Other | Admitting: Orthopaedic Surgery

## 2018-03-27 ENCOUNTER — Encounter (INDEPENDENT_AMBULATORY_CARE_PROVIDER_SITE_OTHER): Payer: Self-pay | Admitting: Orthopaedic Surgery

## 2018-03-27 VITALS — BP 127/73 | HR 65 | Resp 16 | Ht 66.0 in | Wt 220.0 lb

## 2018-03-27 DIAGNOSIS — M65311 Trigger thumb, right thumb: Secondary | ICD-10-CM | POA: Diagnosis not present

## 2018-03-27 DIAGNOSIS — M25572 Pain in left ankle and joints of left foot: Secondary | ICD-10-CM | POA: Diagnosis not present

## 2018-03-27 MED ORDER — LIDOCAINE HCL 1 % IJ SOLN
1.0000 mL | INTRAMUSCULAR | Status: AC | PRN
  Administered 2018-03-27: 1 mL

## 2018-03-27 MED ORDER — METHYLPREDNISOLONE ACETATE 40 MG/ML IJ SUSP
40.0000 mg | INTRAMUSCULAR | Status: AC | PRN
  Administered 2018-03-27: 40 mg

## 2018-03-27 NOTE — Progress Notes (Signed)
Office Visit Note   Patient: Caroline Mason           Date of Birth: [birth year]           MRN: [id number] Visit Date: 03/27/2018              Requested by: Curlene Labrum, MD [place], [place] [address] PCP: Curlene Labrum, MD   Assessment & Plan: Visit Diagnoses:  1. Trigger thumb, right thumb     Plan: Cortisone injection A1 pulley right thumb and monitor response.  Has insertional Achilles tendinitis will use Voltaren gel and we will insert a heel cup Follow-Up Instructions: Return if symptoms worsen or fail to improve.   Orders:  Orders Placed This Encounter  Procedures  . Hand/UE Inj: R thumb A1   No orders of the defined types were placed in this encounter.     Procedures: Hand/UE Inj: R thumb A1 for trigger finger on 03/27/2018 12:25 PM Indications: pain Details: 27 G needle Medications: 1 mL lidocaine 1 %; 40 mg methylPREDNISolone acetate 40 MG/ML      Clinical Data: No additional findings.   Subjective: Chief Complaint  Patient presents with  . Right Hand - Pain    R HAND PAIN FOR FEW MONTHS, HAD CTR 3 YEARS   . Left Foot - Pain    L FOOT/HEEL PAIN FOR 3 MONTH NO INJURY  . Left Hand - Pain    THUMB AND INDEX FINGERS ARE NUMB FOR MONTHS  . New Patient (Initial Visit)    R HAND PAIN FOR FEW MONTHS, HAD CTR 3 YEARS, L FOOT/HEEL PAIN FOR 3 MONTH NO INJURY  Approximately 2 months ago.  No injury or trauma.  Has not had active triggering but certainly has some limited motion of the IP joint.  Prior carpal tunnel syndrome with excellent result and no numbness or tingling. In addition has had some pain along the posterior aspect of her left heel without injury or trauma  HPI  Review of Systems  Constitutional: Negative for fatigue and fever.  HENT: Negative for ear pain.   Eyes: Negative for pain.  Respiratory: Negative for cough and shortness of breath.   Cardiovascular: Negative for leg swelling.  Gastrointestinal: Negative for blood in  stool, constipation and diarrhea.  Genitourinary: Negative for difficulty urinating.  Musculoskeletal: Positive for back pain. Negative for neck pain.  Skin: Negative for rash and wound.  Neurological: Positive for weakness and numbness. Negative for light-headedness and headaches.  Psychiatric/Behavioral: Positive for sleep disturbance.     Objective: Vital Signs: BP 127/73 (BP Location: Left Arm, Patient Position: Sitting, Cuff Size: Normal)   Pulse 65   Resp 16   Ht 5\' 6"  (1.676 m)   Wt 220 lb (99.8 kg)   BMI 35.51 kg/m   Physical Exam  Ortho Exam right thumb with pain directly over small nodule at the metacarpal phalangeal joint region of the palmar aspect active triggering but limited IP motion related to swelling and pain.  Neurovascular exam intact.  No Tinel's over the median nerve.  Has some local tenderness at the insertion of the Achilles tendon on the os calcis left foot.  No swelling.  No ecchymosis or erythema.  Specialty Comments:  No specialty comments available.  Imaging: No results found.   PMFS History: Patient Active Problem List   Diagnosis Date Noted  . Trigger thumb, right thumb 03/27/2018  . High risk medication use 12/01/2016  . Rheumatoid  arthritis with rheumatoid factor of multiple sites without organ or systems involvement ([place]) 12/01/2016  . Fibromyalgia 12/01/2016  . Primary osteoarthritis of both hands 12/01/2016  . Primary osteoarthritis of both knees 12/01/2016  . Primary osteoarthritis of both feet 12/01/2016  . Bradycardia 07/18/2011  . Chronic systolic heart failure ([place]) 06/05/2011  . ANEMIA 03/01/2011  . TOBACCO ABUSE 01/03/2011  . OBSTRUCTIVE SLEEP APNEA 01/03/2011  . ESSENTIAL HYPERTENSION, BENIGN 01/03/2011  . Secondary cardiomyopathy ([place]) 01/03/2011  . COPD 01/03/2011   Past Medical History:  Diagnosis Date  . Atypical pneumonia    12/11  . Cardiomyopathy    Nonischemic, normal coronaries, LVEF 40-45%  . COPD (chronic  obstructive pulmonary disease) ([place])   . Depression   . Essential hypertension, benign   . Fibromyalgia   . Morbid obesity ([place])   . Obstructive sleep apnea   . Rheumatoid arthritis(714.0)     Family History  Problem Relation Age of Onset  . Stroke Mother   . Hypertension Mother   . Heart failure Sister 72  . Breast cancer Sister   . Heart failure Sister 89  . Cancer Sister   . Ovarian cancer Sister   . Diabetes Maternal Grandmother   . Diabetes Paternal Grandfather     Past Surgical History:  Procedure Laterality Date  . CYST REMOVAL TRUNK    . TUBAL LIGATION  1989  . WRIST SURGERY  2016   Social History   Occupational History  . Occupation: Energy manager    Comment: [employer]  Tobacco Use  . Smoking status: Current Some Day Smoker    Packs/day: 0.30    Years: 30.00    Pack years: 9.00    Types: Cigarettes    Start date: 06/03/1976  . Smokeless tobacco: Never Used  . Tobacco comment: smokes 3-4 per day again  Substance and Sexual Activity  . Alcohol use: No    Alcohol/week: 0.0 oz  . Drug use: No  . Sexual activity: Yes    Birth control/protection: None

## 2018-04-02 DIAGNOSIS — M797 Fibromyalgia: Secondary | ICD-10-CM | POA: Diagnosis not present

## 2018-04-02 DIAGNOSIS — E782 Mixed hyperlipidemia: Secondary | ICD-10-CM | POA: Diagnosis not present

## 2018-04-02 DIAGNOSIS — R7301 Impaired fasting glucose: Secondary | ICD-10-CM | POA: Diagnosis not present

## 2018-04-02 DIAGNOSIS — D509 Iron deficiency anemia, unspecified: Secondary | ICD-10-CM | POA: Diagnosis not present

## 2018-04-02 DIAGNOSIS — E559 Vitamin D deficiency, unspecified: Secondary | ICD-10-CM | POA: Diagnosis not present

## 2018-04-04 DIAGNOSIS — Z1331 Encounter for screening for depression: Secondary | ICD-10-CM | POA: Diagnosis not present

## 2018-04-04 DIAGNOSIS — Z1389 Encounter for screening for other disorder: Secondary | ICD-10-CM | POA: Diagnosis not present

## 2018-04-04 DIAGNOSIS — E782 Mixed hyperlipidemia: Secondary | ICD-10-CM | POA: Diagnosis not present

## 2018-04-04 DIAGNOSIS — I5022 Chronic systolic (congestive) heart failure: Secondary | ICD-10-CM | POA: Diagnosis not present

## 2018-04-04 DIAGNOSIS — R7301 Impaired fasting glucose: Secondary | ICD-10-CM | POA: Diagnosis not present

## 2018-04-04 DIAGNOSIS — I10 Essential (primary) hypertension: Secondary | ICD-10-CM | POA: Diagnosis not present

## 2018-05-22 DIAGNOSIS — M79672 Pain in left foot: Secondary | ICD-10-CM | POA: Diagnosis not present

## 2018-05-22 DIAGNOSIS — M7662 Achilles tendinitis, left leg: Secondary | ICD-10-CM | POA: Diagnosis not present

## 2018-06-20 DIAGNOSIS — M9262 Juvenile osteochondrosis of tarsus, left ankle: Secondary | ICD-10-CM | POA: Diagnosis not present

## 2018-06-20 DIAGNOSIS — M79672 Pain in left foot: Secondary | ICD-10-CM | POA: Diagnosis not present

## 2018-07-10 ENCOUNTER — Other Ambulatory Visit: Payer: Self-pay | Admitting: *Deleted

## 2018-07-10 MED ORDER — CARVEDILOL 6.25 MG PO TABS: 6.2500 mg | ORAL_TABLET | Freq: Two times a day (BID) | ORAL | 0 refills | Status: DC

## 2018-08-01 DIAGNOSIS — M79672 Pain in left foot: Secondary | ICD-10-CM | POA: Diagnosis not present

## 2018-08-01 DIAGNOSIS — M9262 Juvenile osteochondrosis of tarsus, left ankle: Secondary | ICD-10-CM | POA: Diagnosis not present

## 2018-08-14 ENCOUNTER — Encounter (INDEPENDENT_AMBULATORY_CARE_PROVIDER_SITE_OTHER): Payer: Self-pay | Admitting: Orthopaedic Surgery

## 2018-08-14 ENCOUNTER — Ambulatory Visit (INDEPENDENT_AMBULATORY_CARE_PROVIDER_SITE_OTHER): Payer: Medicare Other | Admitting: Orthopaedic Surgery

## 2018-08-14 ENCOUNTER — Ambulatory Visit (INDEPENDENT_AMBULATORY_CARE_PROVIDER_SITE_OTHER): Payer: Self-pay

## 2018-08-14 DIAGNOSIS — M67431 Ganglion, right wrist: Secondary | ICD-10-CM | POA: Insufficient documentation

## 2018-08-14 NOTE — Progress Notes (Signed)
Office Visit Note   Patient: Caroline Mason           Date of Birth: 05/03/1956           MRN: [id number] Visit Date: 08/14/2018              Requested by: Curlene Labrum, MD [place],  [id number] PCP: Curlene Labrum, MD   Assessment & Plan: Visit Diagnoses:  1. Ganglion cyst of wrist, right     Plan: Asymptomatic ganglion cyst volar radial aspect right wrist.  Long discussion regarding treatment options.  Would suggest simple observation  Follow-Up Instructions: Return if symptoms worsen or fail to improve.   Orders:  No orders of the defined types were placed in this encounter.  No orders of the defined types were placed in this encounter.     Procedures: No procedures performed   Clinical Data: No additional findings.   Subjective: No chief complaint on file.  Caroline Mason is 62 years old and visited the office for evaluation of a mass on the volar radial aspect of her right wrist.  She does have a history of rheumatoid arthritis and is being followed by Dr Estanislado Pandy.  Approximately 4 months ago she noted a small mass on the volar radial aspect of her wrist.  It is not been painful nor is it causing her any problems.  She simply wanted to be sure that there were "no problems".  She does have significant multiple joint arthralgias really related to her rheumatoid arthritis.  Not experience any numbness or tingling.  HPI  Review of Systems   Objective: Vital Signs: There were no vitals taken for this visit.  Physical Exam  Constitutional: She is oriented to person, place, and time. She appears well-developed and well-nourished.  HENT:  Mouth/Throat: Oropharynx is clear and moist.  Eyes: Pupils are equal, round, and reactive to light. EOM are normal.  Pulmonary/Chest: Effort normal.  Neurological: She is alert and oriented to person, place, and time.  Skin: Skin is warm and dry.  Psychiatric: She has a normal mood and affect. Her behavior is  normal.    Ortho Exam awake alert and oriented x3.  Comfortable sitting.  There is a 1 cm mass on the volar radial aspect of the right wrist consistent with a ganglion cyst.  It did appear to transilluminate. no pain and freely mobile.  No Tinel's at the wrist.  Multiple tenderness over the metatarsal phalangeal joints.  Is able to make a full fist but slowly.  Good capillary refill to fingers.  Specialty Comments:  No specialty comments available.  Imaging: No results found.   PMFS History: Patient Active Problem List   Diagnosis Date Noted  . Ganglion cyst of wrist, right 08/14/2018  . Trigger thumb, right thumb 03/27/2018  . High risk medication use 12/01/2016  . Rheumatoid arthritis with rheumatoid factor of multiple sites without organ or systems involvement ([place]) 12/01/2016  . Fibromyalgia 12/01/2016  . Primary osteoarthritis of both hands 12/01/2016  . Primary osteoarthritis of both knees 12/01/2016  . Primary osteoarthritis of both feet 12/01/2016  . Bradycardia 07/18/2011  . Chronic systolic heart failure ([place]) 06/05/2011  . ANEMIA 03/01/2011  . TOBACCO ABUSE 01/03/2011  . OBSTRUCTIVE SLEEP APNEA 01/03/2011  . ESSENTIAL HYPERTENSION, BENIGN 01/03/2011  . Secondary cardiomyopathy ([place]) 01/03/2011  . COPD 01/03/2011   Past Medical History:  Diagnosis Date  . Atypical pneumonia    12/11  . Cardiomyopathy  Nonischemic, normal coronaries, LVEF 40-45%  . COPD (chronic obstructive pulmonary disease) ([place])   . Depression   . Essential hypertension, benign   . Fibromyalgia   . Morbid obesity ([place])   . Obstructive sleep apnea   . Rheumatoid arthritis(714.0)     Family History  Problem Relation Age of Onset  . Stroke Mother   . Hypertension Mother   . Heart failure Sister 18  . Breast cancer Sister   . Heart failure Sister 11  . Cancer Sister   . Ovarian cancer Sister   . Diabetes Maternal Grandmother   . Diabetes Paternal Grandfather     Past Surgical  History:  Procedure Laterality Date  . CYST REMOVAL TRUNK    . TUBAL LIGATION  1989  . WRIST SURGERY  2016   Social History   Occupational History  . Occupation: Energy manager    Comment: [employer]  Tobacco Use  . Smoking status: Current Some Day Smoker    Packs/day: 0.30    Years: 30.00    Pack years: 9.00    Types: Cigarettes    Start date: 06/03/1976  . Smokeless tobacco: Never Used  . Tobacco comment: smokes 3-4 per day again  Substance and Sexual Activity  . Alcohol use: No    Alcohol/week: 0.0 standard drinks  . Drug use: No  . Sexual activity: Yes    Birth control/protection: None     Garald Balding, MD   Note - This record has been created using [employer].  Chart creation errors have been sought, but may not always  have been located. Such creation errors do not reflect on  the standard of medical care.

## 2018-08-20 ENCOUNTER — Other Ambulatory Visit: Payer: Self-pay | Admitting: Cardiology

## 2018-08-20 MED ORDER — CARVEDILOL 6.25 MG PO TABS: 6.2500 mg | ORAL_TABLET | Freq: Two times a day (BID) | ORAL | 0 refills | Status: DC

## 2018-08-20 NOTE — Telephone Encounter (Signed)
Spoke with pt and the medication was carvedilol - pt made appt for October (was past due) and medication was sent to pharmacy

## 2018-08-20 NOTE — Telephone Encounter (Signed)
°*  STAT* If patient is at the pharmacy, call can be transferred to refill team.   1. Which medications need to be refilled? Patient did not know the name of medication -was not at home to look  2. Which pharmacy/location (including street and city if local pharmacy) is medication to be sent to?[facility] in [place]  3. Do they need a 30 day or 90 day supply?

## 2018-08-29 DIAGNOSIS — M7662 Achilles tendinitis, left leg: Secondary | ICD-10-CM | POA: Diagnosis not present

## 2018-08-29 DIAGNOSIS — M9262 Juvenile osteochondrosis of tarsus, left ankle: Secondary | ICD-10-CM | POA: Diagnosis not present

## 2018-08-29 DIAGNOSIS — M79672 Pain in left foot: Secondary | ICD-10-CM | POA: Diagnosis not present

## 2018-09-07 DIAGNOSIS — S90465A Insect bite (nonvenomous), left lesser toe(s), initial encounter: Secondary | ICD-10-CM | POA: Diagnosis not present

## 2018-09-07 DIAGNOSIS — W57XXXA Bitten or stung by nonvenomous insect and other nonvenomous arthropods, initial encounter: Secondary | ICD-10-CM | POA: Diagnosis not present

## 2018-09-07 DIAGNOSIS — S90862A Insect bite (nonvenomous), left foot, initial encounter: Secondary | ICD-10-CM | POA: Diagnosis not present

## 2018-09-26 NOTE — Progress Notes (Signed)
Cardiology Office Note  Date: 09/27/2018   ID: Caroline Mason, DOB 10-08-1956, MRN [id number]  PCP: Curlene Labrum, MD  Primary Cardiologist: Rozann Lesches, MD   Chief Complaint  Patient presents with  . Cardiomyopathy    History of Present Illness: Caroline Mason is a 62 y.o. female last seen in June 2018.  She presents overdue for a follow-up visit.  She does not report any worsening breathlessness beyond NYHA class II, no exertional chest pain.  No palpitations or syncope.  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  I reviewed her medications.  Cardiac regimen includes Lotensin, Coreg, Norvasc, Lipitor, Lasix, and Aldactone.  I personally reviewed her ECG today which shows sinus rhythm with poor R wave progression which is old.  Last echocardiogram was in 2017 at which point LVEF was approximately 40%.  We discussed obtaining a follow-up study.  She has a wellness exam with lab work pending per Dr. Pleas Koch.  Past Medical History:  Diagnosis Date  . Atypical pneumonia    12/11  . Cardiomyopathy    Nonischemic, normal coronaries, LVEF 40-45%  . COPD (chronic obstructive pulmonary disease) ([place])   . Depression   . Essential hypertension, benign   . Fibromyalgia   . Morbid obesity ([place])   . Obstructive sleep apnea   . Rheumatoid arthritis(714.0)     Past Surgical History:  Procedure Laterality Date  . CYST REMOVAL TRUNK    . TUBAL LIGATION  1989  . WRIST SURGERY  2016    Current Outpatient Medications  Medication Sig Dispense Refill  . amLODipine (NORVASC) 10 MG tablet Take 1 tablet (10 mg total) by mouth daily. 30 tablet 6  . atorvastatin (LIPITOR) 10 MG tablet Take 10 mg by mouth daily.    . benazepril (LOTENSIN) 20 MG tablet Take 1 tablet (20 mg total) by mouth daily. 30 tablet 0  . carvedilol (COREG) 6.25 MG tablet Take 1 tablet (6.25 mg total) by mouth 2 (two) times daily. 180 tablet 0  . Chlorpheniramine-PSE-Ibuprofen (ADVIL ALLERGY  SINUS) 2-30-200 MG TABS Take 1 tablet by mouth daily as needed.      . cholecalciferol (VITAMIN D) 1000 units tablet Take 1,000 Units by mouth daily.    Marland Kitchen docusate sodium (COLACE) 100 MG capsule Take 100 mg by mouth as needed.    . furosemide (LASIX) 20 MG tablet take 1 tablet by mouth once daily 30 tablet 6  . gabapentin (NEURONTIN) 300 MG capsule Take 300 mg by mouth 2 (two) times daily.    Marland Kitchen guaiFENesin-dextromethorphan (ROBITUSSIN DM) 100-10 MG/5ML syrup Take 5 mLs by mouth 3 (three) times daily as needed.      . hydroxychloroquine (PLAQUENIL) 200 MG tablet Take 1 tablet (200 mg total) by mouth 2 (two) times daily. 180 tablet 1  . sertraline (ZOLOFT) 100 MG tablet Take 100 mg by mouth daily.    Marland Kitchen spironolactone (ALDACTONE) 25 MG tablet Take 1 tablet (25 mg total) by mouth daily. 7 tablet 0   No current facility-administered medications for this visit.    Allergies:  Aspirin   Social History: The patient  reports that she has been smoking cigarettes. She started smoking about 42 years ago. She has a 9.00 pack-year smoking history. She has never used smokeless tobacco. She reports that she does not drink alcohol or use drugs.   ROS:  Please see the history of present illness. Otherwise, complete review of systems is positive for chronic  arthritic pain and stiffness.  All other systems are reviewed and negative.   Physical Exam: VS:  BP (!) 144/80   Pulse 76   Ht 5\' 6"  (1.676 m)   Wt 230 lb (104.3 kg)   BMI 37.12 kg/m , BMI Body mass index is 37.12 kg/m.  Wt Readings from Last 3 Encounters:  09/27/18 230 lb (104.3 kg)  03/27/18 220 lb (99.8 kg)  11/23/17 230 lb (104.3 kg)    General: Patient appears comfortable at rest. HEENT: Conjunctiva and lids normal, oropharynx clear. Neck: Supple, no elevated JVP or carotid bruits, no thyromegaly. Lungs: Clear to auscultation, nonlabored breathing at rest. Cardiac: Regular rate and rhythm, no S3 or significant systolic murmur. Abdomen:  Soft, nontender, bowel sounds present. Extremities: No pitting edema, distal pulses 2+. Skin: Warm and dry. Musculoskeletal: No kyphosis. Neuropsychiatric: Alert and oriented x3, affect grossly appropriate.  ECG: I personally reviewed the tracing from 10/02/2016 which showed sinus rhythm with poor R wave progression and nonspecific T wave changes.  Recent Labwork: 11/23/2017: ALT 9; AST 12; BUN 12; Creat 0.71; Hemoglobin 13.3; Platelets 258; Potassium 4.2; Sodium 142  March 2018: Cholesterol 129, triglycerides 60, HDL 60, LDL 57  Other Studies Reviewed Today:  Echocardiogram 08/23/2016: Study Conclusions  - Left ventricle: The cavity size was moderately dilated. Wall thickness was increased in a pattern of mild LVH. The estimated ejection fraction was 40%. Diffuse hypokinesis. Doppler parameters are consistent with abnormal left ventricular relaxation (grade 1 diastolic dysfunction). - Aortic valve: Mildly calcified annulus. Trileaflet. - Left atrium: The atrium was moderately dilated. - Right atrium: The atrium was mildly dilated. Central venous pressure (est): 3 mm Hg. - Atrial septum: No defect or patent foramen ovale was identified. - Tricuspid valve: There was trivial regurgitation. - Pulmonary arteries: PA peak pressure: 11 mm Hg (S). - Pericardium, extracardiac: There was no pericardial effusion.  Impressions:  - Mild LV wall thickness with moderate chamber dilatation and LVEF approximately 40%. There is diffuse hypokinesis. Grade 1 diastolic dysfunction. Moderate left atrial enlargement. Mildly calcified aortic annulus. Trivial tricuspid regurgitation with normal estimated PASP.  Assessment and Plan:  1.  Nonischemic cardiomyopathy, LVEF approximately 40% as of 2017.  We reviewed her current regimen, no changes made today, although we will follow-up with an echocardiogram to reassess LVEF.  Could consider switching to [facility] depending on LV  function.  She is otherwise clinically stable.  2.  Essential hypertension, blood pressure medications reviewed.  Keep follow-up with Dr. Pleas Koch.  3.  Mixed hyperlipidemia, on Lipitor.  She is due for follow-up lab work with Dr. Pleas Koch.  LDL was 57 last year.  4.  Rheumatoid arthritis, he is on Plaquenil with follow-up per rheumatology.  Current medicines were reviewed with the patient today.   Orders Placed This Encounter  Procedures  . EKG 12-Lead  . ECHOCARDIOGRAM COMPLETE    Disposition: Follow-up in 6 months.  Signed, Satira Sark, MD, [facility] 09/27/2018 11:41 AM    [facility] at [address], [place], [place] [address] Phone: [phone]; Fax: [phone]

## 2018-09-27 ENCOUNTER — Other Ambulatory Visit: Payer: Self-pay | Admitting: *Deleted

## 2018-09-27 ENCOUNTER — Encounter: Payer: Self-pay | Admitting: Cardiology

## 2018-09-27 ENCOUNTER — Ambulatory Visit: Payer: Medicare Other | Admitting: Cardiology

## 2018-09-27 VITALS — BP 144/80 | HR 76 | Ht 66.0 in | Wt 230.0 lb

## 2018-09-27 DIAGNOSIS — I10 Essential (primary) hypertension: Secondary | ICD-10-CM

## 2018-09-27 DIAGNOSIS — I429 Cardiomyopathy, unspecified: Secondary | ICD-10-CM | POA: Diagnosis not present

## 2018-09-27 DIAGNOSIS — M069 Rheumatoid arthritis, unspecified: Secondary | ICD-10-CM | POA: Diagnosis not present

## 2018-09-27 DIAGNOSIS — E782 Mixed hyperlipidemia: Secondary | ICD-10-CM | POA: Diagnosis not present

## 2018-09-27 MED ORDER — SPIRONOLACTONE 25 MG PO TABS: 25.0000 mg | ORAL_TABLET | Freq: Every day | ORAL | 2 refills | Status: DC

## 2018-09-27 MED ORDER — AMLODIPINE BESYLATE 10 MG PO TABS: 10.0000 mg | ORAL_TABLET | Freq: Every day | ORAL | 2 refills | Status: DC

## 2018-09-27 MED ORDER — CARVEDILOL 6.25 MG PO TABS: 6.2500 mg | ORAL_TABLET | Freq: Two times a day (BID) | ORAL | 2 refills | Status: DC

## 2018-09-27 MED ORDER — FUROSEMIDE 20 MG PO TABS: 20.0000 mg | ORAL_TABLET | Freq: Every day | ORAL | 2 refills | Status: DC

## 2018-09-27 MED ORDER — BENAZEPRIL HCL 20 MG PO TABS: 20.0000 mg | ORAL_TABLET | Freq: Every day | ORAL | 2 refills | Status: DC

## 2018-09-27 NOTE — Patient Instructions (Addendum)

## 2018-10-07 DIAGNOSIS — D509 Iron deficiency anemia, unspecified: Secondary | ICD-10-CM | POA: Diagnosis not present

## 2018-10-07 DIAGNOSIS — I10 Essential (primary) hypertension: Secondary | ICD-10-CM | POA: Diagnosis not present

## 2018-10-07 DIAGNOSIS — R7301 Impaired fasting glucose: Secondary | ICD-10-CM | POA: Diagnosis not present

## 2018-10-07 DIAGNOSIS — E782 Mixed hyperlipidemia: Secondary | ICD-10-CM | POA: Diagnosis not present

## 2018-10-07 DIAGNOSIS — M797 Fibromyalgia: Secondary | ICD-10-CM | POA: Diagnosis not present

## 2018-10-10 DIAGNOSIS — Z0001 Encounter for general adult medical examination with abnormal findings: Secondary | ICD-10-CM | POA: Diagnosis not present

## 2018-10-15 DIAGNOSIS — H10413 Chronic giant papillary conjunctivitis, bilateral: Secondary | ICD-10-CM | POA: Diagnosis not present

## 2018-10-15 DIAGNOSIS — H04123 Dry eye syndrome of bilateral lacrimal glands: Secondary | ICD-10-CM | POA: Diagnosis not present

## 2018-10-15 DIAGNOSIS — Z79899 Other long term (current) drug therapy: Secondary | ICD-10-CM | POA: Diagnosis not present

## 2018-10-15 DIAGNOSIS — M059 Rheumatoid arthritis with rheumatoid factor, unspecified: Secondary | ICD-10-CM | POA: Diagnosis not present

## 2018-10-15 DIAGNOSIS — H2513 Age-related nuclear cataract, bilateral: Secondary | ICD-10-CM | POA: Diagnosis not present

## 2018-10-24 ENCOUNTER — Other Ambulatory Visit: Payer: Medicare Other

## 2018-10-29 DIAGNOSIS — Z1231 Encounter for screening mammogram for malignant neoplasm of breast: Secondary | ICD-10-CM | POA: Diagnosis not present

## 2018-11-07 ENCOUNTER — Other Ambulatory Visit: Payer: Self-pay

## 2018-11-07 ENCOUNTER — Ambulatory Visit (INDEPENDENT_AMBULATORY_CARE_PROVIDER_SITE_OTHER): Payer: Medicare Other

## 2018-11-07 DIAGNOSIS — I429 Cardiomyopathy, unspecified: Secondary | ICD-10-CM

## 2018-11-11 ENCOUNTER — Telehealth: Payer: Self-pay | Admitting: *Deleted

## 2018-11-11 ENCOUNTER — Other Ambulatory Visit: Payer: Self-pay | Admitting: *Deleted

## 2018-11-11 DIAGNOSIS — I10 Essential (primary) hypertension: Secondary | ICD-10-CM

## 2018-11-11 DIAGNOSIS — I429 Cardiomyopathy, unspecified: Secondary | ICD-10-CM

## 2018-11-11 MED ORDER — SACUBITRIL-VALSARTAN 49-51 MG PO TABS: 1.0000 | ORAL_TABLET | Freq: Two times a day (BID) | ORAL | 6 refills | Status: DC

## 2018-11-11 NOTE — Telephone Encounter (Signed)
Patient informed and will stop lotensin and start Entresto on Thursday since she took lotensin today already. Rx and voucher information for entresto sent to [facility]. Lab order faxed to [facility] lab.

## 2018-11-11 NOTE — Telephone Encounter (Signed)
-----   Message from Satira Sark, MD sent at 11/07/2018 11:30 AM EST ----- Results reviewed.  LVEF remains around 40%.  If she can get adequate coverage, suggest changing from Lotensin to Entresto 49/51 mg twice daily.  She would need to have a BMET in 7 to 10 days. A copy of this test should be forwarded to [place], Virgina Evener, MD.

## 2018-12-30 DIAGNOSIS — I10 Essential (primary) hypertension: Secondary | ICD-10-CM | POA: Diagnosis not present

## 2018-12-30 DIAGNOSIS — I429 Cardiomyopathy, unspecified: Secondary | ICD-10-CM | POA: Diagnosis not present

## 2018-12-31 ENCOUNTER — Telehealth: Payer: Self-pay | Admitting: *Deleted

## 2018-12-31 LAB — BASIC METABOLIC PANEL
BUN: 12 mg/dL (ref 7–25)
CO2: 29 mmol/L (ref 20–32)
Calcium: 9.4 mg/dL (ref 8.6–10.4)
Chloride: 106 mmol/L (ref 98–110)
Creat: 0.67 mg/dL (ref 0.50–0.99)
Glucose, Bld: 94 mg/dL (ref 65–99)
POTASSIUM: 4.8 mmol/L (ref 3.5–5.3)
Sodium: 144 mmol/L (ref 135–146)

## 2018-12-31 NOTE — Telephone Encounter (Signed)
Patient informed. Copy sent to PCP °

## 2018-12-31 NOTE — Telephone Encounter (Signed)
-----   Message from Satira Sark, MD sent at 12/31/2018  8:09 AM EST ----- Results reviewed.  Renal function and potassium are normal.  Continue with current plan. A copy of this test should be forwarded to [place], Virgina Evener, MD.

## 2019-01-01 ENCOUNTER — Ambulatory Visit (INDEPENDENT_AMBULATORY_CARE_PROVIDER_SITE_OTHER): Payer: Medicare Other | Admitting: Orthopaedic Surgery

## 2019-01-01 ENCOUNTER — Encounter (INDEPENDENT_AMBULATORY_CARE_PROVIDER_SITE_OTHER): Payer: Self-pay | Admitting: Orthopaedic Surgery

## 2019-01-01 VITALS — BP 144/84 | HR 70 | Ht 66.0 in | Wt 230.0 lb

## 2019-01-01 DIAGNOSIS — M65311 Trigger thumb, right thumb: Secondary | ICD-10-CM

## 2019-01-01 MED ORDER — LIDOCAINE HCL (PF) 1 % IJ SOLN
0.5000 mL | INTRAMUSCULAR | Status: AC | PRN
  Administered 2019-01-01: .5 mL

## 2019-01-01 MED ORDER — METHYLPREDNISOLONE ACETATE 40 MG/ML IJ SUSP
20.0000 mg | INTRAMUSCULAR | Status: AC | PRN
  Administered 2019-01-01: 20 mg

## 2019-01-01 NOTE — Progress Notes (Signed)
Office Visit Note   Patient: Caroline Mason           Date of Birth: August 29, 1956           MRN: [id number] Visit Date: 01/01/2019              Requested by: Curlene Labrum, MD [place], [place] [address] PCP: Curlene Labrum, MD   Assessment & Plan: Visit Diagnoses:  1. Trigger thumb, right thumb     Plan: Right trigger thumb.  Long discussion regarding treatment options.  I will inject per her preference  Follow-Up Instructions: Return if symptoms worsen or fail to improve.   Orders:  Orders Placed This Encounter  Procedures  . Hand/UE Inj: R thumb A1   No orders of the defined types were placed in this encounter.     Procedures: Hand/UE Inj: R thumb A1 for trigger finger on 01/01/2019 2:22 PM Details: 27 G needle, volar approach Medications: 0.5 mL lidocaine (PF) 1 %; 20 mg methylPREDNISolone acetate 40 MG/ML      Clinical Data: No additional findings.   Subjective: Chief Complaint  Patient presents with  . Right Hand - Pain  Patient presents with right hand pain. She states that this flare-up has been about 4 months. She has a history of rheumatoid arthritis and carpal tunnel release.  She is using copper gloves. Caroline Mason has a history of right trigger thumb and also rheumatoid arthritis.  Her biggest problem at present is with a trigger thumb and wishes to have an injection.  She is aware of the surgical option HPI  Review of Systems   Objective: Vital Signs: BP (!) 144/84   Pulse 70   Ht 5\' 6"  (1.676 m)   Wt 230 lb (104.3 kg)   BMI 37.12 kg/m   Physical Exam Constitutional:      Appearance: She is well-developed.  Eyes:     Pupils: Pupils are equal, round, and reactive to light.  Pulmonary:     Effort: Pulmonary effort is normal.  Skin:    General: Skin is warm and dry.  Neurological:     Mental Status: She is alert and oriented to person, place, and time.  Psychiatric:        Behavior: Behavior normal.     Ortho Exam  active triggering of right thumb with a painful nodule over the metacarpal phalangeal joint of the thumb has incomplete flexion based on the triggering.  Some stiffness of her fingers and mild swelling of the metacarpal phalangeal joints consistent with her rheumatoid arthritis.  Right thumb is not swollen.  Good capillary refill normal sensibility Specialty Comments:  No specialty comments available.  Imaging: No results found.   PMFS History: Patient Active Problem List   Diagnosis Date Noted  . Ganglion cyst of wrist, right 08/14/2018  . Trigger thumb, right thumb 03/27/2018  . High risk medication use 12/01/2016  . Rheumatoid arthritis with rheumatoid factor of multiple sites without organ or systems involvement ([place]) 12/01/2016  . Fibromyalgia 12/01/2016  . Primary osteoarthritis of both hands 12/01/2016  . Primary osteoarthritis of both knees 12/01/2016  . Primary osteoarthritis of both feet 12/01/2016  . Bradycardia 07/18/2011  . Chronic systolic heart failure ([place]) 06/05/2011  . ANEMIA 03/01/2011  . TOBACCO ABUSE 01/03/2011  . OBSTRUCTIVE SLEEP APNEA 01/03/2011  . ESSENTIAL HYPERTENSION, BENIGN 01/03/2011  . Secondary cardiomyopathy ([place]) 01/03/2011  . COPD 01/03/2011   Past Medical History:  Diagnosis Date  .  Atypical pneumonia    12/11  . Cardiomyopathy    Nonischemic, normal coronaries, LVEF 40-45%  . COPD (chronic obstructive pulmonary disease) ([place])   . Depression   . Essential hypertension, benign   . Fibromyalgia   . Morbid obesity ([place])   . Obstructive sleep apnea   . Rheumatoid arthritis(714.0)     Family History  Problem Relation Age of Onset  . Stroke Mother   . Hypertension Mother   . Heart failure Sister 18  . Breast cancer Sister   . Heart failure Sister 59  . Cancer Sister   . Ovarian cancer Sister   . Diabetes Maternal Grandmother   . Diabetes Paternal Grandfather     Past Surgical History:  Procedure Laterality Date  . CYST REMOVAL  TRUNK    . TUBAL LIGATION  1989  . WRIST SURGERY  2016   Social History   Occupational History  . Occupation: Energy manager    Comment: [employer]  Tobacco Use  . Smoking status: Current Some Day Smoker    Packs/day: 0.30    Years: 30.00    Pack years: 9.00    Types: Cigarettes    Start date: 06/03/1976  . Smokeless tobacco: Never Used  . Tobacco comment: smokes 3-4 per day again  Substance and Sexual Activity  . Alcohol use: No    Alcohol/week: 0.0 standard drinks  . Drug use: No  . Sexual activity: Yes    Birth control/protection: None

## 2019-04-04 DIAGNOSIS — M25531 Pain in right wrist: Secondary | ICD-10-CM | POA: Diagnosis not present

## 2019-04-04 DIAGNOSIS — M25532 Pain in left wrist: Secondary | ICD-10-CM | POA: Diagnosis not present

## 2019-04-04 DIAGNOSIS — M25541 Pain in joints of right hand: Secondary | ICD-10-CM | POA: Diagnosis not present

## 2019-04-04 DIAGNOSIS — M25542 Pain in joints of left hand: Secondary | ICD-10-CM | POA: Diagnosis not present

## 2019-04-07 DIAGNOSIS — D509 Iron deficiency anemia, unspecified: Secondary | ICD-10-CM | POA: Diagnosis not present

## 2019-04-07 DIAGNOSIS — E559 Vitamin D deficiency, unspecified: Secondary | ICD-10-CM | POA: Diagnosis not present

## 2019-04-07 DIAGNOSIS — I5022 Chronic systolic (congestive) heart failure: Secondary | ICD-10-CM | POA: Diagnosis not present

## 2019-04-07 DIAGNOSIS — E782 Mixed hyperlipidemia: Secondary | ICD-10-CM | POA: Diagnosis not present

## 2019-04-07 DIAGNOSIS — I10 Essential (primary) hypertension: Secondary | ICD-10-CM | POA: Diagnosis not present

## 2019-04-07 DIAGNOSIS — R7301 Impaired fasting glucose: Secondary | ICD-10-CM | POA: Diagnosis not present

## 2019-04-09 DIAGNOSIS — I428 Other cardiomyopathies: Secondary | ICD-10-CM | POA: Diagnosis not present

## 2019-04-09 DIAGNOSIS — E782 Mixed hyperlipidemia: Secondary | ICD-10-CM | POA: Diagnosis not present

## 2019-04-09 DIAGNOSIS — M25542 Pain in joints of left hand: Secondary | ICD-10-CM | POA: Diagnosis not present

## 2019-04-09 DIAGNOSIS — M25541 Pain in joints of right hand: Secondary | ICD-10-CM | POA: Diagnosis not present

## 2019-04-09 DIAGNOSIS — I10 Essential (primary) hypertension: Secondary | ICD-10-CM | POA: Diagnosis not present

## 2019-04-09 DIAGNOSIS — M25532 Pain in left wrist: Secondary | ICD-10-CM | POA: Diagnosis not present

## 2019-04-09 DIAGNOSIS — I5042 Chronic combined systolic (congestive) and diastolic (congestive) heart failure: Secondary | ICD-10-CM | POA: Diagnosis not present

## 2019-04-09 DIAGNOSIS — M25531 Pain in right wrist: Secondary | ICD-10-CM | POA: Diagnosis not present

## 2019-04-16 DIAGNOSIS — M25542 Pain in joints of left hand: Secondary | ICD-10-CM | POA: Diagnosis not present

## 2019-04-16 DIAGNOSIS — M25641 Stiffness of right hand, not elsewhere classified: Secondary | ICD-10-CM | POA: Diagnosis not present

## 2019-04-16 DIAGNOSIS — M25531 Pain in right wrist: Secondary | ICD-10-CM | POA: Diagnosis not present

## 2019-04-16 DIAGNOSIS — M25532 Pain in left wrist: Secondary | ICD-10-CM | POA: Diagnosis not present

## 2019-04-16 DIAGNOSIS — M25541 Pain in joints of right hand: Secondary | ICD-10-CM | POA: Diagnosis not present

## 2019-04-23 DIAGNOSIS — M25532 Pain in left wrist: Secondary | ICD-10-CM | POA: Diagnosis not present

## 2019-04-23 DIAGNOSIS — M25541 Pain in joints of right hand: Secondary | ICD-10-CM | POA: Diagnosis not present

## 2019-04-23 DIAGNOSIS — M25641 Stiffness of right hand, not elsewhere classified: Secondary | ICD-10-CM | POA: Diagnosis not present

## 2019-04-23 DIAGNOSIS — M25542 Pain in joints of left hand: Secondary | ICD-10-CM | POA: Diagnosis not present

## 2019-04-23 DIAGNOSIS — M25531 Pain in right wrist: Secondary | ICD-10-CM | POA: Diagnosis not present

## 2019-05-16 ENCOUNTER — Telehealth: Payer: Self-pay | Admitting: Cardiology

## 2019-05-16 MED ORDER — CARVEDILOL 6.25 MG PO TABS: 6.2500 mg | ORAL_TABLET | Freq: Two times a day (BID) | ORAL | 1 refills | Status: DC

## 2019-05-16 NOTE — Telephone Encounter (Signed)
Thank you for the update, I am very glad that they caught this.  Yes, she needs to be on carvedilol and would definitely stop benazepril if she is taking Entresto -absolutely should not be on both at the same time.  Would even recommend that they check a BMET to make sure that her electrolytes and renal function are stable.

## 2019-05-16 NOTE — Telephone Encounter (Signed)
Dawn made aware - recent CMP done in April and she will fax over those results (says they were stable)

## 2019-05-16 NOTE — Telephone Encounter (Signed)
Needs to talk with someone about medications Caroline Mason works from Dr Pleas Koch

## 2019-05-16 NOTE — Telephone Encounter (Signed)
Dawn pharmacist from Dr Iverson Alamin office says when pt meds were changed back in November 2019 pt stopped carvedilol instead of benazepril by mistake and wanted to know if ok to resume carvedilol 6.25 mg bid as she was previously on and stop benazepril. Pt is still taking Entresto

## 2019-05-24 DIAGNOSIS — E782 Mixed hyperlipidemia: Secondary | ICD-10-CM | POA: Diagnosis not present

## 2019-05-24 DIAGNOSIS — I10 Essential (primary) hypertension: Secondary | ICD-10-CM | POA: Diagnosis not present

## 2019-05-27 ENCOUNTER — Telehealth: Payer: Self-pay | Admitting: Cardiology

## 2019-05-27 NOTE — Telephone Encounter (Signed)
Virtual Visit Pre-Appointment Phone Call  "(Name), I am calling you today to discuss your upcoming appointment. We are currently trying to limit exposure to the virus that causes COVID-19 by seeing patients at home rather than in the office."  "What is the BEST phone number to call the day of the visit?" - [phone]   1. Do you have or have access to (through a family member/friend) a smartphone with video capability that we can use for your visit?" a. If yes - list this number in appt notes as cell (if different from BEST phone #) and list the appointment type as a VIDEO visit in appointment notes b. If no - list the appointment type as a PHONE visit in appointment notes  2. Confirm consent - "In the setting of the current Covid19 crisis, you are scheduled for a (phone or video) visit with your provider on (date) at (time).  Just as we do with many in-office visits, in order for you to participate in this visit, we must obtain consent.  If you'd like, I can send this to your mychart (if signed up) or email for you to review.  Otherwise, I can obtain your verbal consent now.  All virtual visits are billed to your insurance company just like a normal visit would be.  By agreeing to a virtual visit, we'd like you to understand that the technology does not allow for your provider to perform an examination, and thus may limit your provider's ability to fully assess your condition. If your provider identifies any concerns that need to be evaluated in person, we will make arrangements to do so.  Finally, though the technology is pretty good, we cannot assure that it will always work on either your or our end, and in the setting of a video visit, we may have to convert it to a phone-only visit.  In either situation, we cannot ensure that we have a secure connection.  Are you willing to proceed?" STAFF: Did the patient verbally acknowledge consent to telehealth visit? Document YES/NO here:  YES 3.   4. Advise patient to be prepared - "Two hours prior to your appointment, go ahead and check your blood pressure, pulse, oxygen saturation, and your weight (if you have the equipment to check those) and write them all down. When your visit starts, your provider will ask you for this information. If you have an Apple Watch or Kardia device, please plan to have heart rate information ready on the day of your appointment. Please have a pen and paper handy nearby the day of the visit as well."  5. Give patient instructions for MyChart download to smartphone OR Doximity/Doxy.me as below if video visit (depending on what platform provider is using)  6. Inform patient they will receive a phone call 15 minutes prior to their appointment time (may be from unknown caller ID) so they should be prepared to answer    TELEPHONE CALL NOTE  Caroline Mason has been deemed a candidate for a follow-up tele-health visit to limit community exposure during the Covid-19 pandemic. I spoke with the patient via phone to ensure availability of phone/video source, confirm preferred email & phone number, and discuss instructions and expectations.  I reminded Caroline Mason to be prepared with any vital sign and/or heart rhythm information that could potentially be obtained via home monitoring, at the time of her visit. I reminded Caroline Mason to expect a phone call prior to her visit.  Lynnda Child Slaughter 05/27/2019 11:18 AM   INSTRUCTIONS FOR DOWNLOADING THE MYCHART APP TO SMARTPHONE  - The patient must first make sure to have activated MyChart and know their login information - If Apple, go to [employer] and type in MyChart in the search bar and download the app. If Android, ask patient to go to [employer] and type in [place] in the search bar and download the app. The app is free but as with any other app downloads, their phone may require them to verify saved payment information or Apple/Android  password.  - The patient will need to then log into the app with their MyChart username and password, and select [facility] as their healthcare provider to link the account. When it is time for your visit, go to the MyChart app, find appointments, and click Begin Video Visit. Be sure to Select Allow for your device to access the Microphone and Camera for your visit. You will then be connected, and your provider will be with you shortly.  **If they have any issues connecting, or need assistance please contact MyChart service desk [phone])**  **If using a computer, in order to ensure the best quality for their visit they will need to use either of the following Internet Browsers: [employer], or Google Chrome**  IF USING DOXIMITY or DOXY.ME - The patient will receive a link just prior to their visit by text.     FULL LENGTH CONSENT FOR TELE-HEALTH VISIT   I hereby voluntarily request, consent and authorize [facility] and its employed or contracted physicians, physician assistants, nurse practitioners or other licensed health care professionals (the Practitioner), to provide me with telemedicine health care services (the Services") as deemed necessary by the treating Practitioner. I acknowledge and consent to receive the Services by the Practitioner via telemedicine. I understand that the telemedicine visit will involve communicating with the Practitioner through live audiovisual communication technology and the disclosure of certain medical information by electronic transmission. I acknowledge that I have been given the opportunity to request an in-person assessment or other available alternative prior to the telemedicine visit and am voluntarily participating in the telemedicine visit.  I understand that I have the right to withhold or withdraw my consent to the use of telemedicine in the course of my care at any time, without affecting my right to future care or treatment,  and that the Practitioner or I may terminate the telemedicine visit at any time. I understand that I have the right to inspect all information obtained and/or recorded in the course of the telemedicine visit and may receive copies of available information for a reasonable fee.  I understand that some of the potential risks of receiving the Services via telemedicine include:   Delay or interruption in medical evaluation due to technological equipment failure or disruption;  Information transmitted may not be sufficient (e.g. poor resolution of images) to allow for appropriate medical decision making by the Practitioner; and/or   In rare instances, security protocols could fail, causing a breach of personal health information.  Furthermore, I acknowledge that it is my responsibility to provide information about my medical history, conditions and care that is complete and accurate to the best of my ability. I acknowledge that Practitioner's advice, recommendations, and/or decision may be based on factors not within their control, such as incomplete or inaccurate data provided by me or distortions of diagnostic images or specimens that may result from electronic transmissions. I understand that the  practice of medicine is not an Chief Strategy Officer and that Practitioner makes no warranties or guarantees regarding treatment outcomes. I acknowledge that I will receive a copy of this consent concurrently upon execution via email to the email address I last provided but may also request a printed copy by calling the office of [facility].    I understand that my insurance will be billed for this visit.   I have read or had this consent read to me.  I understand the contents of this consent, which adequately explains the benefits and risks of the Services being provided via telemedicine.   I have been provided ample opportunity to ask questions regarding this consent and the Services and have had my questions  answered to my satisfaction.  I give my informed consent for the services to be provided through the use of telemedicine in my medical care  By participating in this telemedicine visit I agree to the above.

## 2019-05-28 ENCOUNTER — Encounter: Payer: Self-pay | Admitting: *Deleted

## 2019-05-28 ENCOUNTER — Encounter: Payer: Self-pay | Admitting: Cardiology

## 2019-05-28 ENCOUNTER — Telehealth (INDEPENDENT_AMBULATORY_CARE_PROVIDER_SITE_OTHER): Payer: Medicare Other | Admitting: Cardiology

## 2019-05-28 VITALS — BP 148/76 | HR 64 | Ht 66.0 in | Wt 235.0 lb

## 2019-05-28 DIAGNOSIS — E782 Mixed hyperlipidemia: Secondary | ICD-10-CM

## 2019-05-28 DIAGNOSIS — I10 Essential (primary) hypertension: Secondary | ICD-10-CM | POA: Diagnosis not present

## 2019-05-28 DIAGNOSIS — Z7189 Other specified counseling: Secondary | ICD-10-CM | POA: Diagnosis not present

## 2019-05-28 DIAGNOSIS — I428 Other cardiomyopathies: Secondary | ICD-10-CM

## 2019-05-28 DIAGNOSIS — I5022 Chronic systolic (congestive) heart failure: Secondary | ICD-10-CM | POA: Diagnosis not present

## 2019-05-28 NOTE — Patient Instructions (Signed)

## 2019-05-28 NOTE — Progress Notes (Signed)
Virtual Visit via Telephone Note   This visit type was conducted due to national recommendations for restrictions regarding the COVID-19 Pandemic (e.g. social distancing) in an effort to limit this patient's exposure and mitigate transmission in our community.  Due to her co-morbid illnesses, this patient is at least at moderate risk for complications without adequate follow up.  This format is felt to be most appropriate for this patient at this time.  The patient did not have access to video technology/had technical difficulties with video requiring transitioning to audio format only (telephone).  All issues noted in this document were discussed and addressed.  No physical exam could be performed with this format.  Please refer to the patient's chart for her  consent to telehealth for [facility].   Date:  05/28/2019   ID:  Caroline Mason, DOB 09-09-1956, MRN [id number]  Patient Location: Home Provider Location: Office  PCP:  Curlene Labrum, MD  Cardiologist:  Rozann Lesches, MD Electrophysiologist:  None   Evaluation Performed:  Follow-Up Visit  Chief Complaint:   Cardiac follow-up  History of Present Illness:    Caroline Mason is a 63 y.o. female last seen in October 2019.  She did not have video access and we spoke by phone today.  She tells me that she has been doing well overall, NYHA class II dyspnea with typical activities, no exertional chest pain or palpitations.  She has been working in her garden, enjoys Management [employer].  Follow-up echocardiogram in November 2019 revealed LVEF 40% with mild diastolic dysfunction.  Medication adjustments were made at that time and are reviewed below.  She is tolerating Entresto.  She reports having lab work with Dr. Pleas Koch in the last few months. Lab work from January is reviewed below.  She reports no orthopnea or PND, no significant leg swelling.  Her weight is up 5 pounds compared to January.  The patient does not have  symptoms concerning for COVID-19 infection (fever, chills, cough, or new shortness of breath).    Past Medical History:  Diagnosis Date  . Atypical pneumonia    12/11  . Cardiomyopathy    Nonischemic, normal coronaries, LVEF 40-45%  . COPD (chronic obstructive pulmonary disease) ([place])   . Depression   . Essential hypertension   . Fibromyalgia   . Morbid obesity ([place])   . Obstructive sleep apnea   . Rheumatoid arthritis(714.0)    Past Surgical History:  Procedure Laterality Date  . CYST REMOVAL TRUNK    . TUBAL LIGATION  1989  . WRIST SURGERY  2016     Current Meds  Medication Sig  . amLODipine (NORVASC) 10 MG tablet Take 1 tablet (10 mg total) by mouth daily.  Marland Kitchen atorvastatin (LIPITOR) 10 MG tablet Take 10 mg by mouth daily.  . carvedilol (COREG) 6.25 MG tablet Take 1 tablet (6.25 mg total) by mouth 2 (two) times daily.  . Chlorpheniramine-PSE-Ibuprofen (ADVIL ALLERGY SINUS) 2-30-200 MG TABS Take 1 tablet by mouth daily as needed.    . cholecalciferol (VITAMIN D) 1000 units tablet Take 1,000 Units by mouth daily.  Marland Kitchen docusate sodium (COLACE) 100 MG capsule Take 100 mg by mouth as needed.  . furosemide (LASIX) 20 MG tablet Take 1 tablet (20 mg total) by mouth daily.  Marland Kitchen gabapentin (NEURONTIN) 300 MG capsule Take 300 mg by mouth 2 (two) times daily.  Marland Kitchen guaiFENesin-dextromethorphan (ROBITUSSIN DM) 100-10 MG/5ML syrup Take 5 mLs by mouth 3 (three) times daily as needed.    Marland Kitchen  sacubitril-valsartan (ENTRESTO) 49-51 MG Take 1 tablet by mouth 2 (two) times daily. Start on Thursday 11/14/18  . sertraline (ZOLOFT) 100 MG tablet Take 100 mg by mouth daily.  Marland Kitchen spironolactone (ALDACTONE) 25 MG tablet Take 1 tablet (25 mg total) by mouth daily.     Allergies:   Aspirin   Social History   Tobacco Use  . Smoking status: Current Some Day Smoker    Packs/day: 0.30    Years: 30.00    Pack years: 9.00    Types: Cigarettes    Start date: 06/03/1976  . Smokeless tobacco: Never Used  .  Tobacco comment: smokes 3-4 per day again  Substance Use Topics  . Alcohol use: No    Alcohol/week: 0.0 standard drinks  . Drug use: No     Family Hx: The patient's family history includes Breast cancer in her sister; Cancer in her sister; Diabetes in her maternal grandmother and paternal grandfather; Heart failure (age of onset: 74) in her sister and sister; Hypertension in her mother; Ovarian cancer in her sister; Stroke in her mother.  ROS:   Please see the history of present illness. All other systems reviewed and are negative.   Prior CV studies:   The following studies were reviewed today:  Echocardiogram 11/07/2018: Study Conclusions  - Left ventricle: The cavity size was moderately dilated. Wall   thickness was normal. The estimated ejection fraction was 40%.   Diffuse hypokinesis. Doppler parameters are consistent with   abnormal left ventricular relaxation (grade 1 diastolic   dysfunction). - Aortic valve: Mildly calcified annulus. Trileaflet. - Mitral valve: There was trivial regurgitation. - Left atrium: The atrium was mildly dilated. - Right atrium: Central venous pressure (est): 3 mm Hg. - Tricuspid valve: There was trivial regurgitation. - Pulmonary arteries: PA peak pressure: 19 mm Hg (S). - Pericardium, extracardiac: There was no pericardial effusion.  Labs/Other Tests and Data Reviewed:    EKG:  An ECG dated 09/27/2018 was personally reviewed today and demonstrated:  Sinus rhythm with poor R wave progression, rule out old anterior infarct pattern.  Recent Labs: 12/30/2018: BUN 12; Creat 0.67; Potassium 4.8; Sodium 144    Wt Readings from Last 3 Encounters:  05/28/19 235 lb (106.6 kg)  01/01/19 230 lb (104.3 kg)  09/27/18 230 lb (104.3 kg)     Objective:    Vital Signs:  BP (!) 148/76   Pulse 64   Ht 5\' 6"  (1.676 m)   Wt 235 lb (106.6 kg)   BMI 37.93 kg/m    Patient spoke in full sentences, not short of breath. No audible wheezing or coughing.  Speech pattern normal.  ASSESSMENT & PLAN:    1.  Chronic systolic heart failure with nonischemic cardiomyopathy, LVEF approximately 40%.  Medications have been adjusted.  She is tolerating Coreg, Entresto, Aldactone, Norvasc, and Lasix.  We will request interval lab work from Dr. Pleas Koch.  Also follow-up echocardiogram for reassessment of LVEF.  2.  Essential hypertension.  Blood pressure elevated today, she states that when she has checked it otherwise generally her systolics are in the 619J to 130s.  No changes made today.  3.  Mixed hyperlipidemia.  She continues on Lipitor with follow-up by Dr. Pleas Koch.  COVID-19 Education: The signs and symptoms of COVID-19 were discussed with the patient and how to seek care for testing (follow up with PCP or arrange E-visit).  The importance of social distancing was discussed today.  Time:   Today, I have spent 6  minutes with the patient with telehealth technology discussing the above problems.     Medication Adjustments/Labs and Tests Ordered: Current medicines are reviewed at length with the patient today.  Concerns regarding medicines are outlined above.   Tests Ordered: Orders Placed This Encounter  Procedures  . ECHOCARDIOGRAM COMPLETE    Medication Changes: No orders of the defined types were placed in this encounter.   Disposition:  Follow up 6 months in the [place] office.  Signed, Rozann Lesches, MD  05/28/2019 8:59 AM    [place]

## 2019-05-30 ENCOUNTER — Telehealth: Payer: Self-pay | Admitting: Cardiology

## 2019-05-30 NOTE — Telephone Encounter (Signed)
°  Precert needed for: XMDE/K063.4/ZQJSIDXF   Location: [facility]    Date: June 25, 2019

## 2019-06-12 DIAGNOSIS — L282 Other prurigo: Secondary | ICD-10-CM | POA: Diagnosis not present

## 2019-06-12 DIAGNOSIS — M0580 Other rheumatoid arthritis with rheumatoid factor of unspecified site: Secondary | ICD-10-CM | POA: Diagnosis not present

## 2019-06-12 DIAGNOSIS — M797 Fibromyalgia: Secondary | ICD-10-CM | POA: Diagnosis not present

## 2019-06-24 ENCOUNTER — Other Ambulatory Visit: Payer: Self-pay

## 2019-06-24 ENCOUNTER — Telehealth: Payer: Self-pay | Admitting: Cardiology

## 2019-06-24 DIAGNOSIS — E782 Mixed hyperlipidemia: Secondary | ICD-10-CM | POA: Diagnosis not present

## 2019-06-24 DIAGNOSIS — I10 Essential (primary) hypertension: Secondary | ICD-10-CM | POA: Diagnosis not present

## 2019-06-24 NOTE — Telephone Encounter (Signed)

## 2019-06-24 NOTE — Patient Outreach (Signed)
Left message regarding [place] program.

## 2019-06-25 ENCOUNTER — Ambulatory Visit (INDEPENDENT_AMBULATORY_CARE_PROVIDER_SITE_OTHER): Payer: Medicare Other

## 2019-06-25 ENCOUNTER — Other Ambulatory Visit: Payer: Self-pay

## 2019-06-25 DIAGNOSIS — I428 Other cardiomyopathies: Secondary | ICD-10-CM

## 2019-06-26 ENCOUNTER — Telehealth: Payer: Self-pay | Admitting: *Deleted

## 2019-06-26 NOTE — Telephone Encounter (Signed)
-----   Message from Satira Sark, MD sent at 06/25/2019  4:31 PM EDT ----- Results reviewed.  LVEF perhaps somewhat better, now 45% on medical therapy.  Would continue with current follow-up plan.

## 2019-06-30 NOTE — Telephone Encounter (Signed)
Patient informed. Copy sent to PCP °

## 2019-08-23 ENCOUNTER — Other Ambulatory Visit: Payer: Self-pay | Admitting: Cardiology

## 2019-08-25 DIAGNOSIS — I10 Essential (primary) hypertension: Secondary | ICD-10-CM | POA: Diagnosis not present

## 2019-08-25 DIAGNOSIS — I5042 Chronic combined systolic (congestive) and diastolic (congestive) heart failure: Secondary | ICD-10-CM | POA: Diagnosis not present

## 2019-08-25 DIAGNOSIS — E782 Mixed hyperlipidemia: Secondary | ICD-10-CM | POA: Diagnosis not present

## 2019-10-24 DIAGNOSIS — I10 Essential (primary) hypertension: Secondary | ICD-10-CM | POA: Diagnosis not present

## 2019-10-24 DIAGNOSIS — I5042 Chronic combined systolic (congestive) and diastolic (congestive) heart failure: Secondary | ICD-10-CM | POA: Diagnosis not present

## 2019-11-01 ENCOUNTER — Other Ambulatory Visit: Payer: Self-pay | Admitting: Cardiology

## 2019-11-03 DIAGNOSIS — I10 Essential (primary) hypertension: Secondary | ICD-10-CM | POA: Diagnosis not present

## 2019-11-03 DIAGNOSIS — R7301 Impaired fasting glucose: Secondary | ICD-10-CM | POA: Diagnosis not present

## 2019-11-03 DIAGNOSIS — I5042 Chronic combined systolic (congestive) and diastolic (congestive) heart failure: Secondary | ICD-10-CM | POA: Diagnosis not present

## 2019-11-03 DIAGNOSIS — D509 Iron deficiency anemia, unspecified: Secondary | ICD-10-CM | POA: Diagnosis not present

## 2019-11-03 DIAGNOSIS — M797 Fibromyalgia: Secondary | ICD-10-CM | POA: Diagnosis not present

## 2019-11-07 DIAGNOSIS — Z0001 Encounter for general adult medical examination with abnormal findings: Secondary | ICD-10-CM | POA: Diagnosis not present

## 2019-11-07 DIAGNOSIS — I5042 Chronic combined systolic (congestive) and diastolic (congestive) heart failure: Secondary | ICD-10-CM | POA: Diagnosis not present

## 2019-11-07 DIAGNOSIS — I428 Other cardiomyopathies: Secondary | ICD-10-CM | POA: Diagnosis not present

## 2019-11-07 DIAGNOSIS — E559 Vitamin D deficiency, unspecified: Secondary | ICD-10-CM | POA: Diagnosis not present

## 2019-11-13 ENCOUNTER — Encounter: Payer: Self-pay | Admitting: Cardiology

## 2019-11-13 ENCOUNTER — Telehealth: Payer: Self-pay | Admitting: Cardiology

## 2019-11-13 ENCOUNTER — Encounter: Payer: Self-pay | Admitting: *Deleted

## 2019-11-13 ENCOUNTER — Telehealth (INDEPENDENT_AMBULATORY_CARE_PROVIDER_SITE_OTHER): Payer: Medicare Other | Admitting: Cardiology

## 2019-11-13 DIAGNOSIS — I428 Other cardiomyopathies: Secondary | ICD-10-CM

## 2019-11-13 DIAGNOSIS — I5022 Chronic systolic (congestive) heart failure: Secondary | ICD-10-CM

## 2019-11-13 DIAGNOSIS — E782 Mixed hyperlipidemia: Secondary | ICD-10-CM

## 2019-11-13 DIAGNOSIS — I10 Essential (primary) hypertension: Secondary | ICD-10-CM | POA: Diagnosis not present

## 2019-11-13 NOTE — Telephone Encounter (Signed)
Virtual Visit Pre-Appointment Phone Call  "(Name), I am calling you today to discuss your upcoming appointment. We are currently trying to limit exposure to the virus that causes COVID-19 by seeing patients at home rather than in the office."  1. "What is the BEST phone number to call the day of the visit?" - include this in appointment notes  2. Do you have or have access to (through a family member/friend) a smartphone with video capability that we can use for your visit?" a. If yes - list this number in appt notes as cell (if different from BEST phone #) and list the appointment type as a VIDEO visit in appointment notes b. If no - list the appointment type as a PHONE visit in appointment notes  Confirm consent - "In the setting of the current Covid19 crisis, you are scheduled for a (phone or video) visit with your provider on (date) at (time).  Just as we do with many in-office visits, in order for you to participate in this visit, we must obtain consent.  If you'd like, I can send this to your mychart (if signed up) or email for you to review.  Otherwise, I can obtain your verbal consent now.  All virtual visits are billed to your insurance company just like a normal visit would be.  By agreeing to a virtual visit, we'd like you to understand that the technology does not allow for your provider to perform an examination, and thus may limit your provider's ability to fully assess your condition. If your provider identifies any concerns that need to be evaluated in person, we will make arrangements to do so.  Finally, though the technology is pretty good, we cannot assure that it will always work on either your or our end, and in the setting of a video visit, we may have to convert it to a phone-only visit.  In either situation, we cannot ensure that we have a secure connection.  Are you willing to proceed?" STAFF: Did the patient verbally acknowledge consent to telehealth visit? Document  YES/NO here: yes 3. Advise patient to be prepared - "Two hours prior to your appointment, go ahead and check your blood pressure, pulse, oxygen saturation, and your weight (if you have the equipment to check those) and write them all down. When your visit starts, your provider will ask you for this information. If you have an Apple Watch or Kardia device, please plan to have heart rate information ready on the day of your appointment. Please have a pen and paper handy nearby the day of the visit as well."  4. Give patient instructions for MyChart download to smartphone OR Doximity/Doxy.me as below if video visit (depending on what platform provider is using)  5. Inform patient they will receive a phone call 15 minutes prior to their appointment time (may be from unknown caller ID) so they should be prepared to answer    TELEPHONE CALL NOTE  Caroline Mason has been deemed a candidate for a follow-up tele-health visit to limit community exposure during the Covid-19 pandemic. I spoke with the patient via phone to ensure availability of phone/video source, confirm preferred email & phone number, and discuss instructions and expectations.  I reminded Caroline Mason to be prepared with any vital sign and/or heart rhythm information that could potentially be obtained via home monitoring, at the time of her visit. I reminded Caroline Mason to expect a phone call prior to her visit.  Caroline Mason 11/13/2019 9:07 AM   INSTRUCTIONS FOR DOWNLOADING THE MYCHART APP TO SMARTPHONE  - The patient must first make sure to have activated MyChart and know their login information - If Apple, go to [employer] and type in MyChart in the search bar and download the app. If Android, ask patient to go to [employer] and type in [place] in the search bar and download the app. The app is free but as with any other app downloads, their phone may require them to verify saved payment information or  [employer]/Android password.  - The patient will need to then log into the app with their MyChart username and password, and select [place] [facility] as their healthcare provider to link the account. When it is time for your visit, go to the MyChart app, find appointments, and click Begin Video Visit. Be sure to Select Allow for your device to access the Microphone and Camera for your visit. You will then be connected, and your provider will be with you shortly.  **If they have any issues connecting, or need assistance please contact MyChart service desk [phone])**  **If using a computer, in order to ensure the best quality for their visit they will need to use either of the following Internet Browsers: [employer], or Google Chrome**  IF USING DOXIMITY or DOXY.ME - The patient will receive a link just prior to their visit by text.     FULL LENGTH CONSENT FOR TELE-HEALTH VISIT   I hereby voluntarily request, consent and authorize [facility] and its employed or contracted physicians, physician assistants, nurse practitioners or other licensed health care professionals (the Practitioner), to provide me with telemedicine health care services (the Services") as deemed necessary by the treating Practitioner. I acknowledge and consent to receive the Services by the Practitioner via telemedicine. I understand that the telemedicine visit will involve communicating with the Practitioner through live audiovisual communication technology and the disclosure of certain medical information by electronic transmission. I acknowledge that I have been given the opportunity to request an in-person assessment or other available alternative prior to the telemedicine visit and am voluntarily participating in the telemedicine visit.  I understand that I have the right to withhold or withdraw my consent to the use of telemedicine in the course of my care at any time, without affecting my right to future care  or treatment, and that the Practitioner or I may terminate the telemedicine visit at any time. I understand that I have the right to inspect all information obtained and/or recorded in the course of the telemedicine visit and may receive copies of available information for a reasonable fee.  I understand that some of the potential risks of receiving the Services via telemedicine include:   Delay or interruption in medical evaluation due to technological equipment failure or disruption;  Information transmitted may not be sufficient (e.g. poor resolution of images) to allow for appropriate medical decision making by the Practitioner; and/or   In rare instances, security protocols could fail, causing a breach of personal health information.  Furthermore, I acknowledge that it is my responsibility to provide information about my medical history, conditions and care that is complete and accurate to the best of my ability. I acknowledge that Practitioner's advice, recommendations, and/or decision may be based on factors not within their control, such as incomplete or inaccurate data provided by me or distortions of diagnostic images or specimens that may result from electronic transmissions. I understand that the  practice of medicine is not an Chief Strategy Officer and that Practitioner makes no warranties or guarantees regarding treatment outcomes. I acknowledge that I will receive a copy of this consent concurrently upon execution via email to the email address I last provided but may also request a printed copy by calling the office of [place].    I understand that my insurance will be billed for this visit.   I have read or had this consent read to me.  I understand the contents of this consent, which adequately explains the benefits and risks of the Services being provided via telemedicine.   I have been provided ample opportunity to ask questions regarding this consent and the Services and have had  my questions answered to my satisfaction.  I give my informed consent for the services to be provided through the use of telemedicine in my medical care  By participating in this telemedicine visit I agree to the above.

## 2019-11-13 NOTE — Patient Instructions (Addendum)

## 2019-11-13 NOTE — Progress Notes (Signed)
Virtual Visit via Telephone Note   This visit type was conducted due to national recommendations for restrictions regarding the COVID-19 Pandemic (e.g. social distancing) in an effort to limit this patient's exposure and mitigate transmission in our community.  Due to her co-morbid illnesses, this patient is at least at moderate risk for complications without adequate follow up.  This format is felt to be most appropriate for this patient at this time.  The patient did not have access to video technology/had technical difficulties with video requiring transitioning to audio format only (telephone).  All issues noted in this document were discussed and addressed.  No physical exam could be performed with this format.  Please refer to the patient's chart for her  consent to telehealth for [facility].   Date:  11/13/2019   ID:  Caroline Mason, DOB 12/05/1956, MRN [id number]  Patient Location: Home Provider Location: Office  PCP:  Curlene Labrum, MD  Cardiologist:  Rozann Lesches, MD Electrophysiologist:  None   Evaluation Performed:  Follow-Up Visit  Chief Complaint:   Cardiac follow-up  History of Present Illness:    Caroline Mason is a 63 y.o. female last assessed via telehealth encounter in June.  We spoke by phone today.  She states that she has been doing well overall.  She is working part-time in a group home.  She reports compliance with her medications.  Weight has crept up over the last 6 months that she has had some orthopnea.  Admits that she is not taking her Lasix regularly however.  I reviewed her current cardiac regimen which is outlined below.  She is on Coreg, Norvasc, Entresto, and Aldactone.  She will try to start using Lasix 20 mg at least every other day.  We are requesting her recent lab work from Dr. Pleas Koch.  Follow-up echocardiogram in July showed LVEF approximately 45%, normal RV contraction.  The patient does not have symptoms concerning for  COVID-19 infection (fever, chills, cough, or new shortness of breath).    Past Medical History:  Diagnosis Date  . Atypical pneumonia    12/11  . Cardiomyopathy    Nonischemic, normal coronaries, LVEF 40-45%  . COPD (chronic obstructive pulmonary disease) ([place])   . Depression   . Essential hypertension   . Fibromyalgia   . Morbid obesity ([place])   . Obstructive sleep apnea   . Rheumatoid arthritis(714.0)    Past Surgical History:  Procedure Laterality Date  . CYST REMOVAL TRUNK    . TUBAL LIGATION  1989  . WRIST SURGERY  2016     Current Meds  Medication Sig  . amLODipine (NORVASC) 10 MG tablet Take 1 tablet (10 mg total) by mouth daily.  Marland Kitchen atorvastatin (LIPITOR) 10 MG tablet Take 10 mg by mouth daily.  . carvedilol (COREG) 6.25 MG tablet Take 1 tablet (6.25 mg total) by mouth 2 (two) times daily.  . Chlorpheniramine-PSE-Ibuprofen (ADVIL ALLERGY SINUS) 2-30-200 MG TABS Take 1 tablet by mouth daily as needed.    . cholecalciferol (VITAMIN D) 1000 units tablet Take 1,000 Units by mouth daily.  Marland Kitchen docusate sodium (COLACE) 100 MG capsule Take 100 mg by mouth as needed.  . furosemide (LASIX) 20 MG tablet Take 1 tablet (20 mg total) by mouth daily.  Marland Kitchen gabapentin (NEURONTIN) 300 MG capsule Take 300 mg by mouth 2 (two) times daily.  Marland Kitchen guaiFENesin-dextromethorphan (ROBITUSSIN DM) 100-10 MG/5ML syrup Take 5 mLs by mouth 3 (three) times daily as needed.    Marland Kitchen  sacubitril-valsartan (ENTRESTO) 49-51 MG Take 1 tablet by mouth 2 (two) times daily. Start on Thursday 11/14/18  . sertraline (ZOLOFT) 100 MG tablet Take 100 mg by mouth daily.  Marland Kitchen spironolactone (ALDACTONE) 25 MG tablet TAKE 1 TABLET(25 MG) BY MOUTH DAILY     Allergies:   Aspirin   Social History   Tobacco Use  . Smoking status: Current Some Day Smoker    Packs/day: 0.30    Years: 30.00    Pack years: 9.00    Types: Cigarettes    Start date: 06/03/1976  . Smokeless tobacco: Never Used  . Tobacco comment: smokes 3-4 per day  again  Substance Use Topics  . Alcohol use: No    Alcohol/week: 0.0 standard drinks  . Drug use: No     Family Hx: The patient's family history includes Breast cancer in her sister; Cancer in her sister; Diabetes in her maternal grandmother and paternal grandfather; Heart failure (age of onset: 84) in her sister and sister; Hypertension in her mother; Ovarian cancer in her sister; Stroke in her mother.  ROS:   Please see the history of present illness. All other systems reviewed and are negative.   Prior CV studies:   The following studies were reviewed today:  Echocardiogram 06/25/2019:  1. The left ventricle has a visually estimated ejection fraction of 45%%. The cavity size was normal. There is moderately increased left ventricular wall thickness. Left ventricular diastolic Doppler parameters are consistent with impaired relaxation.  2. The right ventricle has normal systolic function. The cavity was normal. There is no increase in right ventricular wall thickness.  3. Left atrial size was severely dilated.  4. No evidence of mitral valve stenosis.  5. The aortic valve has an indeterminate number of cusps. No stenosis of the aortic valve. Mild aortic annular calcification noted.  6. Pulmonary hypertension is indeterminate, inadequate TR jet.  Labs/Other Tests and Data Reviewed:    EKG:  An ECG dated 09/27/2018 was personally reviewed today and demonstrated:  Sinus rhythm with poor R wave progression, rule out old anterior infarct pattern.  Recent Labs:  April 2020: BUN 8, creatinine 0.76, potassium 4.0, AST 12, ALT 8, cholesterol 147, triglycerides 82, HDL 59, LDL 72, hemoglobin 13.1, platelets 290, TSH 1.96  Wt Readings from Last 3 Encounters:  11/13/19 240 lb (108.9 kg)  05/28/19 235 lb (106.6 kg)  01/01/19 230 lb (104.3 kg)     Objective:    Vital Signs:  BP 117/70   Ht 5\' 6"  (1.676 m)   Wt 240 lb (108.9 kg)   BMI 38.74 kg/m    Patient spoke in full sentences, not  short of breath. No audible wheezing or coughing. Speech pattern normal.  ASSESSMENT & PLAN:    1.  Nonischemic cardiomyopathy with chronic systolic heart failure.  Follow-up echocardiogram in July showed LVEF approximately 45%.  She is doing well on medical therapy including Coreg, Entresto, and Aldactone.  She will start using Lasix 20 mg every other day, otherwise no significant changes for now.  Requesting interval lab work.  2.  Essential hypertension, blood pressure is well controlled today.  No changes made to current regimen.  3.  Mixed hyperlipidemia.  She continues on Lipitor.  Last LDL 72, requesting interval lab work.  COVID-19 Education: The signs and symptoms of COVID-19 were discussed with the patient and how to seek care for testing (follow up with PCP or arrange E-visit).  The importance of social distancing was discussed today.  Time:   Today, I have spent 5 minutes with the patient with telehealth technology discussing the above problems.     Medication Adjustments/Labs and Tests Ordered: Current medicines are reviewed at length with the patient today.  Concerns regarding medicines are outlined above.   Tests Ordered: No orders of the defined types were placed in this encounter.   Medication Changes: No orders of the defined types were placed in this encounter.   Follow Up:  In Person 6 months in the [place] office.  Signed, Rozann Lesches, MD  11/13/2019 10:20 AM    [place]

## 2019-11-24 DIAGNOSIS — I10 Essential (primary) hypertension: Secondary | ICD-10-CM | POA: Diagnosis not present

## 2019-11-24 DIAGNOSIS — I5042 Chronic combined systolic (congestive) and diastolic (congestive) heart failure: Secondary | ICD-10-CM | POA: Diagnosis not present

## 2019-12-25 DIAGNOSIS — E782 Mixed hyperlipidemia: Secondary | ICD-10-CM | POA: Diagnosis not present

## 2019-12-25 DIAGNOSIS — I10 Essential (primary) hypertension: Secondary | ICD-10-CM | POA: Diagnosis not present

## 2020-01-22 DIAGNOSIS — Z1231 Encounter for screening mammogram for malignant neoplasm of breast: Secondary | ICD-10-CM | POA: Diagnosis not present

## 2020-02-05 DIAGNOSIS — Z780 Asymptomatic menopausal state: Secondary | ICD-10-CM | POA: Diagnosis not present

## 2020-02-05 DIAGNOSIS — M810 Age-related osteoporosis without current pathological fracture: Secondary | ICD-10-CM | POA: Diagnosis not present

## 2020-02-20 DIAGNOSIS — E7849 Other hyperlipidemia: Secondary | ICD-10-CM | POA: Diagnosis not present

## 2020-02-20 DIAGNOSIS — I10 Essential (primary) hypertension: Secondary | ICD-10-CM | POA: Diagnosis not present

## 2020-03-24 DIAGNOSIS — E7849 Other hyperlipidemia: Secondary | ICD-10-CM | POA: Diagnosis not present

## 2020-03-24 DIAGNOSIS — I10 Essential (primary) hypertension: Secondary | ICD-10-CM | POA: Diagnosis not present

## 2020-04-23 DIAGNOSIS — I10 Essential (primary) hypertension: Secondary | ICD-10-CM | POA: Diagnosis not present

## 2020-04-23 DIAGNOSIS — I5022 Chronic systolic (congestive) heart failure: Secondary | ICD-10-CM | POA: Diagnosis not present

## 2020-04-25 ENCOUNTER — Other Ambulatory Visit: Payer: Self-pay | Admitting: Cardiology

## 2020-04-29 DIAGNOSIS — E559 Vitamin D deficiency, unspecified: Secondary | ICD-10-CM | POA: Diagnosis not present

## 2020-04-29 DIAGNOSIS — R7301 Impaired fasting glucose: Secondary | ICD-10-CM | POA: Diagnosis not present

## 2020-04-29 DIAGNOSIS — I10 Essential (primary) hypertension: Secondary | ICD-10-CM | POA: Diagnosis not present

## 2020-04-29 DIAGNOSIS — K219 Gastro-esophageal reflux disease without esophagitis: Secondary | ICD-10-CM | POA: Diagnosis not present

## 2020-04-29 DIAGNOSIS — M797 Fibromyalgia: Secondary | ICD-10-CM | POA: Diagnosis not present

## 2020-04-29 DIAGNOSIS — I5032 Chronic diastolic (congestive) heart failure: Secondary | ICD-10-CM | POA: Diagnosis not present

## 2020-05-05 NOTE — Progress Notes (Signed)
Office Visit Note  Patient: Caroline Mason             Date of Birth: 05-19-1956           MRN: [id number]             PCP: Curlene Labrum, MD Referring: Curlene Labrum, MD Visit Date: 05/17/2020 Occupation: @GUAROCC @  Subjective:  Pain in both knee joints  History of Present Illness: Caroline Mason is a 64 y.o. female with history of seropositive rheumatoid arthritis and osteoarthritis.  Patient was previously taking Plaquenil 200 mg 1 tablet twice daily but discontinued in 2018 due to clinically doing well.  She previously tolerated Plaquenil without any side effects.  According to the patient she has been doing well since the last 6 months.  She has had intermittent pain and inflammation in both hands, both knees, both ankle joints.  She presents today with severe pain in both knees especially the left knee.  She denies any recent injuries or falls.  She is not having any mechanical symptoms.  She would like a left knee joint cortisone injection today.  Activities of Daily Living:  Patient reports joint stiffness all day  Patient Reports nocturnal pain.  Difficulty dressing/grooming: Denies Difficulty climbing stairs: Reports Difficulty getting out of chair: Reports Difficulty using hands for taps, buttons, cutlery, and/or writing: Reports  Review of Systems  Constitutional: Positive for fatigue.  HENT: Negative for mouth sores, mouth dryness and nose dryness.   Eyes: Positive for dryness. Negative for pain and visual disturbance.  Respiratory: Negative for cough, hemoptysis and difficulty breathing.   Cardiovascular: Negative for chest pain, palpitations and hypertension.  Gastrointestinal: Negative for blood in stool, constipation and diarrhea.  Genitourinary: Negative for difficulty urinating and painful urination.  Musculoskeletal: Positive for arthralgias, joint pain, joint swelling, muscle weakness, morning stiffness and muscle tenderness. Negative for myalgias  and myalgias.  Skin: Negative for color change, pallor, rash, hair loss, nodules/bumps, skin tightness, ulcers and sensitivity to sunlight.  Neurological: Negative for dizziness and headaches.  Hematological: Negative for bruising/bleeding tendency and swollen glands.  Psychiatric/Behavioral: Negative for depressed mood and sleep disturbance. The patient is not nervous/anxious.     PMFS History:  Patient Active Problem List   Diagnosis Date Noted   Ganglion cyst of wrist, right 08/14/2018   Trigger thumb, right thumb 03/27/2018   High risk medication use 12/01/2016   Rheumatoid arthritis with rheumatoid factor of multiple sites without organ or systems involvement ([place]) 12/01/2016   Fibromyalgia 12/01/2016   Primary osteoarthritis of both hands 12/01/2016   Primary osteoarthritis of both knees 12/01/2016   Primary osteoarthritis of both feet 12/01/2016   Bradycardia [id number]   Chronic systolic heart failure ([place]) 06/05/2011   ANEMIA 03/01/2011   TOBACCO ABUSE 01/03/2011   OBSTRUCTIVE SLEEP APNEA 01/03/2011   ESSENTIAL HYPERTENSION, BENIGN 01/03/2011   Secondary cardiomyopathy ([place]) 01/03/2011   COPD 01/03/2011    Past Medical History:  Diagnosis Date   Atypical pneumonia    12/11   Cardiomyopathy    Nonischemic, normal coronaries, LVEF 40-45%   COPD (chronic obstructive pulmonary disease) ([facility])    Depression    Essential hypertension    Fibromyalgia    Morbid obesity ([facility])    Obstructive sleep apnea    Rheumatoid arthritis(714.0)     Family History  Problem Relation Age of Onset   Stroke Mother    Hypertension Mother    Heart failure Sister 53   Breast cancer  Sister    Heart failure Sister 90   Cancer Sister    Ovarian cancer Sister    Diabetes Maternal Grandmother    Diabetes Paternal Grandfather    Past Surgical History:  Procedure Laterality Date   CYST REMOVAL TRUNK     TUBAL LIGATION  1989   WRIST SURGERY  2016    Social History   Social History Narrative   Lives in [place].   Immunization History  Administered Date(s) Administered   Influenza-Unspecified 09/24/2014, 10/25/2018     Objective: Vital Signs: BP 109/66 (BP Location: Left Arm, Patient Position: Sitting, Cuff Size: Normal)    Pulse 77    Resp 18    Ht 5\' 5"  (1.651 m)    Wt 251 lb (113.9 kg)    BMI 41.77 kg/m    Physical Exam Vitals and nursing note reviewed.  Constitutional:      Appearance: She is well-developed.  HENT:     Head: Normocephalic and atraumatic.  Eyes:     Conjunctiva/sclera: Conjunctivae normal.  Pulmonary:     Effort: Pulmonary effort is normal.  Abdominal:     General: Bowel sounds are normal.     Palpations: Abdomen is soft.  Musculoskeletal:     Cervical back: Normal range of motion.  Lymphadenopathy:     Cervical: No cervical adenopathy.  Skin:    General: Skin is warm and dry.     Capillary Refill: Capillary refill takes less than 2 seconds.  Neurological:     Mental Status: She is alert and oriented to person, place, and time.  Psychiatric:        Behavior: Behavior normal.      Musculoskeletal Exam: C-spine, thoracic spine, and lumbar spine good ROM. Shoulder joints painful and slightly limited ROM.  Bilateral elbow joint contractures noted.  Limited ROM of both wrist joints. MCPs, PIPs, and DIPs good ROM with no synovitis. Complete fist formation. Hip joints good ROM with some discomfort in the right hip.  Painful ROM of both knee joints.  Warmth of the left knee joint noted.  Tenderness of both ankle joints.  Tenderness and inflammation of the right 2nd and 3rd PIP joints in both feet.   CDAI Exam: CDAI Score: 8  Patient Global: 5 mm; Provider Global: 5 mm Swollen: 1 ; Tender: 6  Joint Exam 05/17/2020      Right  Left  Glenohumeral   Tender   Tender  Wrist   Tender   Tender  Knee   Tender  Swollen Tender     Investigation: No additional findings.  Imaging: XR Foot 2 Views  Left  Result Date: 05/17/2020 First MTP, PIP and DIP narrowing was noted.  Inferior and posterior calcaneal spurs were noted.  No intertarsal joint space narrowing was noted. Impression: These findings are consistent with osteoarthritis of the foot.  XR Foot 2 Views Right  Result Date: 05/17/2020 First MTP, PIP and DIP narrowing was noted.  Intertarsal joint space narrowing was noted.  Inferior and posterior calcaneal spurs were noted.  No erosive changes were noted. Impression: These findings are consistent with osteoarthritis of the foot.  XR Hand 2 View Left  Result Date: 05/17/2020 Extra-articular osteopenia was noted.  PIP and DIP narrowing was noted.  CMC narrowing was noted.  Intercarpal radiocarpal joint space narrowing was noted. Impression: These findings are consistent with rheumatoid arthritis and osteoarthritis overlap.  XR Hand 2 View Right  Result Date: 05/17/2020 Juxta-articular osteopenia was noted.  First MCP,  PIP and DIP narrowing was noted.  Intercarpal and radiocarpal joint space narrowing was noted.  No erosive changes were noted. Impression: These findings are consistent with rheumatoid arthritis and osteoarthritis overlap.  XR KNEE 3 VIEW LEFT  Result Date: 05/17/2020 Moderate to severe right medial compartment narrowing was noted.  Mild patellofemoral narrowing was noted.  No chondrocalcinosis was noted. Impression: These findings are consistent with moderate to severe osteoarthritis and mild chondromalacia patella.  XR KNEE 3 VIEW RIGHT  Result Date: 05/17/2020 Moderate medial compartment narrowing with medial intercondylar and lateral osteophytes were noted.  No chondrocalcinosis was noted.  Mild patellofemoral narrowing was noted. Impression: These findings are consistent with moderate osteoarthritis and mild chondromalacia patella.   Recent Labs: Lab Results  Component Value Date   WBC 6.6 11/23/2017   HGB 13.3 11/23/2017   PLT 258 11/23/2017   NA 144  12/30/2018   K 4.8 12/30/2018   CL 106 12/30/2018   CO2 29 12/30/2018   GLUCOSE 94 12/30/2018   BUN 12 12/30/2018   CREATININE 0.67 12/30/2018   BILITOT 0.8 11/23/2017   ALKPHOS 102 12/05/2016   AST 12 11/23/2017   ALT 9 11/23/2017   PROT 7.1 11/23/2017   ALBUMIN 4.0 12/05/2016   CALCIUM 9.4 12/30/2018   GFRAA 108 11/23/2017    Speciality Comments: PLQ Eye Exam: 10/15/18 WNL [facility] follow up 1 year  Procedures:  Large Joint Inj: L knee on 05/17/2020 11:49 AM Indications: pain Details: 27 G 1.5 in needle, medial approach  Arthrogram: No  Medications: 1.5 mL lidocaine 1 %; 40 mg triamcinolone acetonide 40 MG/ML Aspirate: 0 mL Outcome: tolerated well, no immediate complications Procedure, treatment alternatives, risks and benefits explained, specific risks discussed. Consent was given by the patient. Immediately prior to procedure a time out was called to verify the correct patient, procedure, equipment, support staff and site/side marked as required. Patient was prepped and draped in the usual sterile fashion.     Allergies: Aspirin   Assessment / Plan:     Visit Diagnoses: Rheumatoid arthritis with rheumatoid factor of multiple sites without organ or systems involvement ([facility]) - +RF, synovitis: Patient presents today with increased pain in multiple joints over the past 6 months.  She discontinued Plaquenil in 2018 due to clinically doing well.  She previously tolerated Plaquenil without any side effects.  She is experiencing severe pain in both knees especially the left knee joint.  The left knee was warm on examination today.  She has painful range of motion of both knees.  She has not had any recent injuries, falls, or mechanical symptoms.  She has been experiencing increased pain in multiple joints including both shoulders, both wrists, and both hands.  She has intermittent swelling in both hands and both knees.  X-rays of both hands, both knee joints, and both  feet were updated today to assess for radiographic progression.  No interval changes or erosive changes noted.  We discussed restarting on Plaquenil.  She will start taking Plaquenil 200 mg 1 tablet by mouth twice daily.  All questions were addressed and consent was obtained today.  She was advised to schedule an updated Plaquenil eye exam.  CMP was ordered on 04/29/2020.  We will check a CBC today and if labs are stable a prescription for Plaquenil 200 mg 1 tablet twice daily will be sent to the pharmacy.  She will update lab work in 1 month then 3 months and every 5 months.  Standing orders  for CBC and CMP were placed today.  She will follow-up in the office in 6 weeks.- Plan: XR Hand 2 View Right, XR Hand 2 View Left, XR Foot 2 Views Right, XR Foot 2 Views Left  Patient was counseled on the purpose, proper use, and adverse effects of hydroxychloroquine including nausea/diarrhea, skin rash, headaches, and sun sensitivity.  Discussed importance of annual eye exams while on hydroxychloroquine to monitor to ocular toxicity and discussed importance of frequent laboratory monitoring.  Provided patient with eye exam form for baseline ophthalmologic exam.  Provided patient with educational materials on hydroxychloroquine and answered all questions.  Patient consented to hydroxychloroquine.  Will upload consent in the media tab.    Dose will be Plaquenil 200 mg twice daily.  Prescription pending lab results.  High risk medication use - Plaquenil 200 mg 1 tablet by mouth twice a day. PLQ Eye Exam: 10/15/18.  She was advised to schedule an updated Plaquenil eye exam.  CMP was ordered on 04/29/2020.  We will recheck CBC today.  She will return for lab work in 1 month in 3 months and every 5 months.  Standing orders were placed today.  Primary osteoarthritis of both hands -She has intermittent pain and inflammation in both hands and both wrist joints.  X-rays of both hands were updated today.  She has findings consistent  with rheumatoid arthritis and osteoarthritis overlap.  No erosive changes were noted.  There was no radiographic progression when compared to x-rays from 11/23/2017.  She previously went to hand therapy.  She was encouraged to continue to perform hand exercises.  Plan: XR Hand 2 View Right, XR Hand 2 View Left  Pain in both hands -She has been experiencing increased pain and inflammation in both hands.  X-rays of both hands were consistent with osteoarthritis and RA overlap. Plan: XR Hand 2 View Right, XR Hand 2 View Left  Primary osteoarthritis of both knees - She presents today with increased pain in both knee joints.  She has intermittent warmth and inflammation in both knees, especially the left knee.  X-rays of both knees were updated today. She has moderate to severe osteoarthritis bilaterally with mild chondromalacia patella. She requested a left knee joint cortisone injection.  She tolerated the procedure well.  Procedure note completed above.  Aftercare was discussed.  - Plan: XR KNEE 3 VIEW LEFT, XR KNEE 3 VIEW RIGHT  Chronic pain of both knees - She present today with increased pain in both knee joints.  She has painful ROM bilaterally. Warmth of the left knee joint noted. X-rays were updated today.  She requested a left knee joint cortisone injection. Plan: XR KNEE 3 VIEW LEFT, XR KNEE 3 VIEW RIGHT  Primary osteoarthritis of both feet -She has intermittent pain in both feet. She has tenderness and inflammation of bilateral 2nd and 3rd PIP joints. X-rays of both feet were obtained today. X-rays were consistent with osteoarthritis of both feet.  Plan: XR Foot 2 Views Right  Fibromyalgia: She has intermittent myalgias and muscle tenderness due to fibromyalgia.    Other medical conditions are listed as follows:   History of sleep apnea  History of vitamin D deficiency  History of anemia  History of COPD    Orders: Orders Placed This Encounter  Procedures   Large Joint Inj   XR  Hand 2 View Right   XR Hand 2 View Left   XR KNEE 3 VIEW LEFT   XR KNEE 3 VIEW RIGHT  XR Foot 2 Views Right   XR Foot 2 Views Left   CBC with Differential/Platelet   CBC with Differential/Platelet   COMPLETE METABOLIC PANEL WITH GFR   No orders of the defined types were placed in this encounter.   Face-to-face time spent with patient was 30 minutes. Greater than 50% of time was spent in counseling and coordination of care.  Follow-Up Instructions: Return in about 6 weeks (around 06/28/2020) for Rheumatoid arthritis, Osteoarthritis.   Hazel Sams, PA-C  I examined and evaluated the patient with Hazel Sams PA. Patient has known history of rheumatoid arthritis. She has been off Plaquenil and has been having a flare. Today after detailed discussion we decided to place her back on Plaquenil that she was having a flare. She was in agreement. Per her request her left knee joint was injected with cortisone as described above. She tolerated the procedure well. The plan of care was discussed as noted above.  Bo Merino, MD  Note - This record has been created using Editor, commissioning.  Chart creation errors have been sought, but may not always  have been located. Such creation errors do not reflect on  the standard of medical care.

## 2020-05-06 DIAGNOSIS — I10 Essential (primary) hypertension: Secondary | ICD-10-CM | POA: Diagnosis not present

## 2020-05-06 DIAGNOSIS — I5042 Chronic combined systolic (congestive) and diastolic (congestive) heart failure: Secondary | ICD-10-CM | POA: Diagnosis not present

## 2020-05-06 DIAGNOSIS — I428 Other cardiomyopathies: Secondary | ICD-10-CM | POA: Diagnosis not present

## 2020-05-06 DIAGNOSIS — M0580 Other rheumatoid arthritis with rheumatoid factor of unspecified site: Secondary | ICD-10-CM | POA: Diagnosis not present

## 2020-05-17 ENCOUNTER — Ambulatory Visit: Payer: Self-pay

## 2020-05-17 ENCOUNTER — Other Ambulatory Visit: Payer: Self-pay

## 2020-05-17 ENCOUNTER — Encounter: Payer: Self-pay | Admitting: Rheumatology

## 2020-05-17 ENCOUNTER — Telehealth: Payer: Self-pay

## 2020-05-17 ENCOUNTER — Ambulatory Visit: Payer: Medicare Other | Admitting: Rheumatology

## 2020-05-17 VITALS — BP 109/66 | HR 77 | Resp 18 | Ht 65.0 in | Wt 251.0 lb

## 2020-05-17 DIAGNOSIS — M19042 Primary osteoarthritis, left hand: Secondary | ICD-10-CM

## 2020-05-17 DIAGNOSIS — M19071 Primary osteoarthritis, right ankle and foot: Secondary | ICD-10-CM | POA: Diagnosis not present

## 2020-05-17 DIAGNOSIS — M25562 Pain in left knee: Secondary | ICD-10-CM

## 2020-05-17 DIAGNOSIS — Z8639 Personal history of other endocrine, nutritional and metabolic disease: Secondary | ICD-10-CM

## 2020-05-17 DIAGNOSIS — M79642 Pain in left hand: Secondary | ICD-10-CM | POA: Diagnosis not present

## 2020-05-17 DIAGNOSIS — Z862 Personal history of diseases of the blood and blood-forming organs and certain disorders involving the immune mechanism: Secondary | ICD-10-CM

## 2020-05-17 DIAGNOSIS — M19072 Primary osteoarthritis, left ankle and foot: Secondary | ICD-10-CM | POA: Diagnosis not present

## 2020-05-17 DIAGNOSIS — M0579 Rheumatoid arthritis with rheumatoid factor of multiple sites without organ or systems involvement: Secondary | ICD-10-CM

## 2020-05-17 DIAGNOSIS — M19041 Primary osteoarthritis, right hand: Secondary | ICD-10-CM | POA: Diagnosis not present

## 2020-05-17 DIAGNOSIS — M79641 Pain in right hand: Secondary | ICD-10-CM | POA: Diagnosis not present

## 2020-05-17 DIAGNOSIS — M797 Fibromyalgia: Secondary | ICD-10-CM

## 2020-05-17 DIAGNOSIS — M25561 Pain in right knee: Secondary | ICD-10-CM

## 2020-05-17 DIAGNOSIS — G8929 Other chronic pain: Secondary | ICD-10-CM | POA: Diagnosis not present

## 2020-05-17 DIAGNOSIS — Z8669 Personal history of other diseases of the nervous system and sense organs: Secondary | ICD-10-CM

## 2020-05-17 DIAGNOSIS — Z8709 Personal history of other diseases of the respiratory system: Secondary | ICD-10-CM

## 2020-05-17 DIAGNOSIS — M170 Bilateral primary osteoarthritis of knee: Secondary | ICD-10-CM | POA: Diagnosis not present

## 2020-05-17 DIAGNOSIS — Z79899 Other long term (current) drug therapy: Secondary | ICD-10-CM

## 2020-05-17 LAB — CBC WITH DIFFERENTIAL/PLATELET
Absolute Monocytes: 817 cells/uL (ref 200–950)
Basophils Absolute: 35 cells/uL (ref 0–200)
Basophils Relative: 0.3 %
Eosinophils Absolute: 161 cells/uL (ref 15–500)
Eosinophils Relative: 1.4 %
HCT: 38 % (ref 35.0–45.0)
Hemoglobin: 12.4 g/dL (ref 11.7–15.5)
Lymphs Abs: 2174 cells/uL (ref 850–3900)
MCH: 28 pg (ref 27.0–33.0)
MCHC: 32.6 g/dL (ref 32.0–36.0)
MCV: 85.8 fL (ref 80.0–100.0)
MPV: 9.3 fL (ref 7.5–12.5)
Monocytes Relative: 7.1 %
Neutro Abs: 8315 cells/uL — ABNORMAL HIGH (ref 1500–7800)
Neutrophils Relative %: 72.3 %
Platelets: 277 10*3/uL (ref 140–400)
RBC: 4.43 10*6/uL (ref 3.80–5.10)
RDW: 13.2 % (ref 11.0–15.0)
Total Lymphocyte: 18.9 %
WBC: 11.5 10*3/uL — ABNORMAL HIGH (ref 3.8–10.8)

## 2020-05-17 NOTE — Patient Instructions (Signed)
Standing Labs We placed an order today for your standing lab work.    Please come back and get your standing labs in 1 month, 3 months, then every 5 months   We have open lab daily Monday through Thursday from 8:30-12:30 PM and 1:30-4:30 PM and Friday from 8:30-12:30 PM and 1:30-4:00 PM at the office of Dr. Bo Merino.   You may experience shorter wait times on Monday and Friday afternoons. The office is located at [address] [place], [place], [place] [address] No appointment is necessary.   Labs are drawn by [facility].  You may receive a bill from [facility] for your lab work.  If you wish to have your labs drawn at another location, please call the office 24 hours in advance to send orders.  If you have any questions regarding directions or hours of operation,  please call [phone].   Just as a reminder please drink plenty of water prior to coming for your lab work. Thanks!    Hydroxychloroquine tablets What is this medicine? HYDROXYCHLOROQUINE (hye drox ee KLOR oh kwin) is used to treat rheumatoid arthritis and systemic lupus erythematosus. It is also used to treat malaria. This medicine may be used for other purposes; ask your health care provider or pharmacist if you have questions. COMMON BRAND NAME(S): Plaquenil, Quineprox What should I tell my health care provider before I take this medicine? They need to know if you have any of these conditions:  diabetes  eye disease, vision problems  G6PD deficiency  heart disease  history of irregular heartbeat  if you often drink alcohol  kidney disease  liver disease  porphyria  psoriasis  an unusual or allergic reaction to chloroquine, hydroxychloroquine, other medicines, foods, dyes, or preservatives  pregnant or trying to get pregnant  breast-feeding How should I use this medicine? Take this medicine by mouth with a glass of water. Follow the directions on the prescription label. Do not cut, crush or  chew this medicine. Swallow the tablets whole. Take this medicine with food. Avoid taking antacids within 4 hours of taking this medicine. It is best to separate these medicines by at least 4 hours. Take your medicine at regular intervals. Do not take it more often than directed. Take all of your medicine as directed even if you think you are better. Do not skip doses or stop your medicine early. Talk to your pediatrician regarding the use of this medicine in children. While this drug may be prescribed for selected conditions, precautions do apply. Overdosage: If you think you have taken too much of this medicine contact a poison control center or emergency room at once. NOTE: This medicine is only for you. Do not share this medicine with others. What if I miss a dose? If you miss a dose, take it as soon as you can. If it is almost time for your next dose, take only that dose. Do not take double or extra doses. What may interact with this medicine? Do not take this medicine with any of the following medications:  cisapride  dronedarone  pimozide  thioridazine This medicine may also interact with the following medications:  ampicillin  antacids  cimetidine  cyclosporine  digoxin  kaolin  medicines for diabetes, like insulin, glipizide, glyburide  medicines for seizures like carbamazepine, phenobarbital, phenytoin  mefloquine  methotrexate  other medicines that prolong the QT interval (cause an abnormal heart rhythm)  praziquantel This list may not describe all possible interactions. Give your health care  provider a list of all the medicines, herbs, non-prescription drugs, or dietary supplements you use. Also tell them if you smoke, drink alcohol, or use illegal drugs. Some items may interact with your medicine. What should I watch for while using this medicine? Visit your health care professional for regular checks on your progress. Tell your health care professional if your  symptoms do not start to get better or if they get worse. You may need blood work done while you are taking this medicine. If you take other medicines that can affect heart rhythm, you may need more testing. Talk to your health care professional if you have questions. Your vision may be tested before and during use of this medicine. Tell your health care professional right away if you have any change in your eyesight. What side effects may I notice from receiving this medicine? Side effects that you should report to your doctor or health care professional as soon as possible:  allergic reactions like skin rash, itching or hives, swelling of the face, lips, or tongue  changes in vision  decreased hearing or ringing of the ears  muscle weakness  redness, blistering, peeling or loosening of the skin, including inside the mouth  sensitivity to light  signs and symptoms of a dangerous change in heartbeat or heart rhythm like chest pain; dizziness; fast or irregular heartbeat; palpitations; feeling faint or lightheaded, falls; breathing problems  signs and symptoms of liver injury like dark yellow or brown urine; general ill feeling or flu-like symptoms; light-colored stools; loss of appetite; nausea; right upper belly pain; unusually weak or tired; yellowing of the eyes or skin  signs and symptoms of low blood sugar such as feeling anxious; confusion; dizziness; increased hunger; unusually weak or tired; sweating; shakiness; cold; irritable; headache; blurred vision; fast heartbeat; loss of consciousness  suicidal thoughts  uncontrollable head, mouth, neck, arm, or leg movements Side effects that usually do not require medical attention (report to your doctor or health care professional if they continue or are bothersome):  diarrhea  dizziness  hair loss  headache  irritable  loss of appetite  nausea, vomiting  stomach pain This list may not describe all possible side effects.  Call your doctor for medical advice about side effects. You may report side effects to FDA at 1-800-FDA-1088. Where should I keep my medicine? Keep out of the reach of children. Store at room temperature between 15 and 30 degrees C (59 and 86 degrees F). Protect from moisture and light. Throw away any unused medicine after the expiration date. NOTE: This sheet is a summary. It may not cover all possible information. If you have questions about this medicine, talk to your doctor, pharmacist, or health care provider.  2020 Elsevier/Gold Standard (2019-04-21 12:56:32)

## 2020-05-17 NOTE — Telephone Encounter (Signed)
patient will be starting plaquenil pending lab results, per Dr. Estanislado Pandy. Dose will be Plaquenil 200 mg twice daily.

## 2020-05-18 NOTE — Progress Notes (Signed)
WBC count is mildly elevated. Absolute neutrophils are elevated.  Please ask if the patient has had any recent infections.   Rest of CBC WNL.

## 2020-05-19 MED ORDER — HYDROXYCHLOROQUINE SULFATE 200 MG PO TABS
200.0000 mg | ORAL_TABLET | Freq: Two times a day (BID) | ORAL | 0 refills | Status: DC
Start: 2020-05-19 — End: 2020-08-03

## 2020-05-19 NOTE — Telephone Encounter (Signed)
Labs resulted and patient advised. Prescription sent to the pharmacy.

## 2020-05-24 DIAGNOSIS — I110 Hypertensive heart disease with heart failure: Secondary | ICD-10-CM | POA: Diagnosis not present

## 2020-05-24 DIAGNOSIS — M797 Fibromyalgia: Secondary | ICD-10-CM | POA: Diagnosis not present

## 2020-05-24 DIAGNOSIS — I5042 Chronic combined systolic (congestive) and diastolic (congestive) heart failure: Secondary | ICD-10-CM | POA: Diagnosis not present

## 2020-05-24 DIAGNOSIS — R7301 Impaired fasting glucose: Secondary | ICD-10-CM | POA: Diagnosis not present

## 2020-06-21 NOTE — Progress Notes (Signed)
Office Visit Note  Patient: Caroline Mason             Date of Birth: [birth year]           MRN: [id number]             PCP: Curlene Labrum, MD Referring: Curlene Labrum, MD Visit Date: 06/29/2020 Occupation: @GUAROCC @  Subjective:  Medication monitoring   History of Present Illness: Caroline Mason is a 64 y.o. female with history of seropositive rheumatoid arthritis and osteoarthritis.  Patient is taking Plaquenil 200 mg 1 tablet by mouth twice daily which she started 3 weeks ago.  She is tolerating Plaquenil without any side effects.  She states that she is already started to notice improvement since taking Plaquenil.  She experiences some stiffness in both hands and both knees but denies any joint swelling currently.  She has ongoing trochanter bursitis of the right hip.  She experiences intermittent myalgias and muscle tenderness due to fibromyalgia and frequent weather changes.    Activities of Daily Living:  Patient reports morning stiffness for 1  hour.   Patient Reports nocturnal pain.  Difficulty dressing/grooming: Denies Difficulty climbing stairs: Reports Difficulty getting out of chair: Denies Difficulty using hands for taps, buttons, cutlery, and/or writing: Reports  Review of Systems  Constitutional: Negative for fatigue.  HENT: Negative for mouth sores, mouth dryness and nose dryness.   Eyes: Negative for pain, itching, visual disturbance and dryness.  Respiratory: Negative for cough, hemoptysis, shortness of breath and difficulty breathing.   Cardiovascular: Negative for chest pain, palpitations, hypertension and swelling in legs/feet.  Gastrointestinal: Negative for blood in stool, constipation and diarrhea.  Endocrine: Negative for increased urination.  Genitourinary: Negative for difficulty urinating and painful urination.  Musculoskeletal: Positive for arthralgias, joint pain, joint swelling, myalgias, morning stiffness, muscle tenderness and myalgias.  Negative for muscle weakness.  Skin: Negative for color change, pallor, rash, hair loss, nodules/bumps, redness, skin tightness, ulcers and sensitivity to sunlight.  Allergic/Immunologic: Negative for susceptible to infections.  Neurological: Negative for dizziness, numbness, headaches and weakness.  Hematological: Negative for bruising/bleeding tendency and swollen glands.  Psychiatric/Behavioral: Negative for depressed mood, confusion and sleep disturbance. The patient is not nervous/anxious.     PMFS History:  Patient Active Problem List   Diagnosis Date Noted  . Ganglion cyst of wrist, right 08/14/2018  . Trigger thumb, right thumb 03/27/2018  . High risk medication use 12/01/2016  . Rheumatoid arthritis with rheumatoid factor of multiple sites without organ or systems involvement ([place]) 12/01/2016  . Fibromyalgia 12/01/2016  . Primary osteoarthritis of both hands 12/01/2016  . Primary osteoarthritis of both knees 12/01/2016  . Primary osteoarthritis of both feet 12/01/2016  . Bradycardia 07/18/2011  . Chronic systolic heart failure ([place]) 06/05/2011  . ANEMIA 03/01/2011  . TOBACCO ABUSE 01/03/2011  . OBSTRUCTIVE SLEEP APNEA 01/03/2011  . ESSENTIAL HYPERTENSION, BENIGN 01/03/2011  . Secondary cardiomyopathy ([place]) 01/03/2011  . COPD 01/03/2011    Past Medical History:  Diagnosis Date  . Atypical pneumonia    12/11  . Cardiomyopathy    Nonischemic, normal coronaries, LVEF 40-45%  . COPD (chronic obstructive pulmonary disease) ([place])   . Depression   . Essential hypertension   . Fibromyalgia   . Morbid obesity ([place])   . Obstructive sleep apnea   . Rheumatoid arthritis(714.0)     Family History  Problem Relation Age of Onset  . Stroke Mother   . Hypertension Mother   . Heart failure  Sister 60  . Breast cancer Sister   . Heart failure Sister 33  . Cancer Sister   . Ovarian cancer Sister   . Diabetes Maternal Grandmother   . Diabetes Paternal Grandfather    Past  Surgical History:  Procedure Laterality Date  . CYST REMOVAL TRUNK    . TUBAL LIGATION  1989  . WRIST SURGERY  2016   Social History   Social History Narrative   Lives in [place].   Immunization History  Administered Date(s) Administered  . Influenza-Unspecified 09/24/2014, 10/25/2018     Objective: Vital Signs: BP 119/74 (BP Location: Left Arm, Patient Position: Sitting, Cuff Size: Large)   Pulse 73   Resp 15   Ht 5\' 6"  (1.676 m)   Wt 250 lb 12.8 oz (113.8 kg)   BMI 40.48 kg/m    Physical Exam Vitals and nursing note reviewed.  Constitutional:      Appearance: She is well-developed.  HENT:     Head: Normocephalic and atraumatic.  Eyes:     Conjunctiva/sclera: Conjunctivae normal.  Pulmonary:     Effort: Pulmonary effort is normal.  Abdominal:     General: Bowel sounds are normal.     Palpations: Abdomen is soft.  Musculoskeletal:     Cervical back: Normal range of motion.  Lymphadenopathy:     Cervical: No cervical adenopathy.  Skin:    General: Skin is warm and dry.     Capillary Refill: Capillary refill takes less than 2 seconds.  Neurological:     Mental Status: She is alert and oriented to person, place, and time.  Psychiatric:        Behavior: Behavior normal.      Musculoskeletal Exam: C-spine, thoracic spine, lumbar spine good range of motion.  Shoulder joints have good range of motion with no discomfort at this time.  Elbow joints, wrist joints, MCPs, PIPs, DIPs have good range of motion with no synovitis.  No tenderness or inflammation of wrist joints noted.  Hip joints have good range of motion with no discomfort.  She has tenderness over the right trochanteric bursa.  Warmth of both knee joints noted.  Ankle joints have good range of motion with no tenderness or inflammation.  No tenderness of MTP joints.  CDAI Exam: CDAI Score: 1  Patient Global: 6 mm; Provider Global: 4 mm Swollen: 0 ; Tender: 0  Joint Exam 06/29/2020   No joint exam has  been documented for this visit   There is currently no information documented on the homunculus. Go to the Rheumatology activity and complete the homunculus joint exam.  Investigation: No additional findings.  Imaging: No results found.  Recent Labs: Lab Results  Component Value Date   WBC 11.5 (H) 05/17/2020   HGB 12.4 05/17/2020   PLT 277 05/17/2020   NA 144 12/30/2018   K 4.8 12/30/2018   CL 106 12/30/2018   CO2 29 12/30/2018   GLUCOSE 94 12/30/2018   BUN 12 12/30/2018   CREATININE 0.67 12/30/2018   BILITOT 0.8 11/23/2017   ALKPHOS 102 12/05/2016   AST 12 11/23/2017   ALT 9 11/23/2017   PROT 7.1 11/23/2017   ALBUMIN 4.0 12/05/2016   CALCIUM 9.4 12/30/2018   GFRAA 108 11/23/2017    Speciality Comments: PLQ Eye Exam: 10/15/18 WNL [facility] follow up 1 year  Procedures:  No procedures performed Allergies: Aspirin   Assessment / Plan:     Visit Diagnoses: Rheumatoid arthritis with rheumatoid factor of multiple  sites without organ or systems involvement ([place]) - +RF, synovitis: She has warmth of both knee joints on exam today.  No effusion was noted.  She experiences intermittent discomfort in both wrist joints in both knee joints but her discomfort has been improving since restarting Plaquenil.  She is taking Plaquenil 200 mg 1 tablet by mouth twice daily.  She is tolerating Plaquenil without any side effects.  She was encouraged to schedule a baseline Plaquenil eye exam.  She will continue on the current dose of Plaquenil.  She does not need any refills at this time.  She was advised to notify us if she develops increased joint pain or joint swelling.  She will follow-up in the office in 3 months.  High risk medication use - Plaquenil 200 mg 1 tablet by mouth twice a day. PLQ Eye Exam: 10/15/18.  She was advised to schedule an updated Plaquenil eye exam.  She was given a Plaquenil eye exam form today in the office to take with her to her upcoming appointment.   CBC and CMP were drawn today to monitor for drug toxicity.- Plan: CBC with Differential/Platelet, COMPLETE METABOLIC PANEL WITH GFR  Primary osteoarthritis of both hands: She is not having any discomfort or inflammation at this time.  No synovitis or tenderness noted on exam.  Joint protection and muscle strengthening were discussed.    Primary osteoarthritis of both knees: She experiences intermittent pain and inflammation in both knee joints.  Warmth of both knee joints noted on exam.    Primary osteoarthritis of both feet: She is not having any discomfort in her feet at this time.  She has good ROM of both ankle joints.  No tenderness or inflammation of ankle joints noted.   Fibromyalgia: She has intermittent myalgias and muscle tenderness due to fibromyalgia.  Her myalgias are most severe with frequent weather changes.    Other medical conditions are listed as follows:   History of vitamin D deficiency  History of sleep apnea  History of COPD  History of anemia  Orders: Orders Placed This Encounter  Procedures  . CBC with Differential/Platelet  . COMPLETE METABOLIC PANEL WITH GFR   No orders of the defined types were placed in this encounter.   Follow-Up Instructions: Return in about 3 months (around 09/29/2020) for Rheumatoid arthritis, Osteoarthritis, Fibromyalgia.   Bo Merino, MD   Scribed by-  Hazel Sams, PA-C  Note - This record has been created using Dragon software.  Chart creation errors have been sought, but may not always  have been located. Such creation errors do not reflect on  the standard of medical care.

## 2020-06-23 DIAGNOSIS — I5042 Chronic combined systolic (congestive) and diastolic (congestive) heart failure: Secondary | ICD-10-CM | POA: Diagnosis not present

## 2020-06-23 DIAGNOSIS — R7301 Impaired fasting glucose: Secondary | ICD-10-CM | POA: Diagnosis not present

## 2020-06-23 DIAGNOSIS — I110 Hypertensive heart disease with heart failure: Secondary | ICD-10-CM | POA: Diagnosis not present

## 2020-06-23 DIAGNOSIS — M797 Fibromyalgia: Secondary | ICD-10-CM | POA: Diagnosis not present

## 2020-06-29 ENCOUNTER — Ambulatory Visit: Payer: Medicare Other | Admitting: Rheumatology

## 2020-06-29 ENCOUNTER — Encounter: Payer: Self-pay | Admitting: Rheumatology

## 2020-06-29 ENCOUNTER — Other Ambulatory Visit: Payer: Self-pay

## 2020-06-29 VITALS — BP 119/74 | HR 73 | Resp 15 | Ht 66.0 in | Wt 250.8 lb

## 2020-06-29 DIAGNOSIS — Z8709 Personal history of other diseases of the respiratory system: Secondary | ICD-10-CM

## 2020-06-29 DIAGNOSIS — M19072 Primary osteoarthritis, left ankle and foot: Secondary | ICD-10-CM

## 2020-06-29 DIAGNOSIS — Z862 Personal history of diseases of the blood and blood-forming organs and certain disorders involving the immune mechanism: Secondary | ICD-10-CM

## 2020-06-29 DIAGNOSIS — M797 Fibromyalgia: Secondary | ICD-10-CM

## 2020-06-29 DIAGNOSIS — Z79899 Other long term (current) drug therapy: Secondary | ICD-10-CM | POA: Diagnosis not present

## 2020-06-29 DIAGNOSIS — M0579 Rheumatoid arthritis with rheumatoid factor of multiple sites without organ or systems involvement: Secondary | ICD-10-CM | POA: Diagnosis not present

## 2020-06-29 DIAGNOSIS — M19071 Primary osteoarthritis, right ankle and foot: Secondary | ICD-10-CM

## 2020-06-29 DIAGNOSIS — Z8669 Personal history of other diseases of the nervous system and sense organs: Secondary | ICD-10-CM

## 2020-06-29 DIAGNOSIS — M19041 Primary osteoarthritis, right hand: Secondary | ICD-10-CM | POA: Diagnosis not present

## 2020-06-29 DIAGNOSIS — Z8639 Personal history of other endocrine, nutritional and metabolic disease: Secondary | ICD-10-CM

## 2020-06-29 DIAGNOSIS — M170 Bilateral primary osteoarthritis of knee: Secondary | ICD-10-CM

## 2020-06-29 DIAGNOSIS — M19042 Primary osteoarthritis, left hand: Secondary | ICD-10-CM

## 2020-06-29 NOTE — Patient Instructions (Signed)
Hip Bursitis Rehab Ask your health care provider which exercises are safe for you. Do exercises exactly as told by your health care provider and adjust them as directed. It is normal to feel mild stretching, pulling, tightness, or discomfort as you do these exercises. Stop right away if you feel sudden pain or your pain gets worse. Do not begin these exercises until told by your health care provider. Stretching exercise This exercise warms up your muscles and joints and improves the movement and flexibility of your hip. This exercise also helps to relieve pain and stiffness. Iliotibial band stretch An iliotibial band is a strong band of muscle tissue that runs from the outer side of your hip to the outer side of your thigh and knee. 1. Lie on your side with your left / right leg in the top position. 2. Bend your left / right knee and grab your ankle. Stretch out your bottom arm to help you balance. 3. Slowly bring your knee back so your thigh is behind your body. 4. Slowly lower your knee toward the floor until you feel a gentle stretch on the outside of your left / right thigh. If you do not feel a stretch and your knee will not fall farther, place the heel of your other foot on top of your knee and pull your knee down toward the floor with your foot. 5. Hold this position for __________ seconds. 6. Slowly return to the starting position. Repeat __________ times. Complete this exercise __________ times a day. Strengthening exercises These exercises build strength and endurance in your hip and pelvis. Endurance is the ability to use your muscles for a long time, even after they get tired. Bridge This exercise strengthens the muscles that move your thigh backward (hip extensors). 1. Lie on your back on a firm surface with your knees bent and your feet flat on the floor. 2. Tighten your buttocks muscles and lift your buttocks off the floor until your trunk is level with your thighs. ? Do not arch  your back. ? You should feel the muscles working in your buttocks and the back of your thighs. If you do not feel these muscles, slide your feet 1-2 inches (2.5-5 cm) farther away from your buttocks. ? If this exercise is too easy, try doing it with your arms crossed over your chest. 3. Hold this position for __________ seconds. 4. Slowly lower your hips to the starting position. 5. Let your muscles relax completely after each repetition. Repeat __________ times. Complete this exercise __________ times a day. Squats This exercise strengthens the muscles in front of your thigh and knee (quadriceps). 1. Stand in front of a table, with your feet and knees pointing straight ahead. You may rest your hands on the table for balance but not for support. 2. Slowly bend your knees and lower your hips like you are going to sit in a chair. ? Keep your weight over your heels, not over your toes. ? Keep your lower legs upright so they are parallel with the table legs. ? Do not let your hips go lower than your knees. ? Do not bend lower than told by your health care provider. ? If your hip pain increases, do not bend as low. 3. Hold the squat position for __________ seconds. 4. Slowly push with your legs to return to standing. Do not use your hands to pull yourself to standing. Repeat __________ times. Complete this exercise __________ times a day. Hip hike 1. Stand   sideways on a bottom step. Stand on your left / right leg with your other foot unsupported next to the step. You can hold on to the railing or wall for balance if needed. 2. Keep your knees straight and your torso square. Then lift your left / right hip up toward the ceiling. 3. Hold this position for __________ seconds. 4. Slowly let your left / right hip lower toward the floor, past the starting position. Your foot should get closer to the floor. Do not lean or bend your knees. Repeat __________ times. Complete this exercise __________ times a  day. Single leg stand 1. Without shoes, stand near a railing or in a doorway. You may hold on to the railing or door frame as needed for balance. 2. Squeeze your left / right buttock muscles, then lift up your other foot. ? Do not let your left / right hip push out to the side. ? It is helpful to stand in front of a mirror for this exercise so you can watch your hip. 3. Hold this position for __________ seconds. Repeat __________ times. Complete this exercise __________ times a day. This information is not intended to replace advice given to you by your health care provider. Make sure you discuss any questions you have with your health care provider. Document Revised: 04/07/2019 Document Reviewed: 04/07/2019 Elsevier Patient Education  2020 Elsevier Inc.  

## 2020-06-30 LAB — CBC WITH DIFFERENTIAL/PLATELET
Absolute Monocytes: 703 cells/uL (ref 200–950)
Basophils Absolute: 50 cells/uL (ref 0–200)
Basophils Relative: 0.5 %
Eosinophils Absolute: 79 cells/uL (ref 15–500)
Eosinophils Relative: 0.8 %
HCT: 39.1 % (ref 35.0–45.0)
Hemoglobin: 13 g/dL (ref 11.7–15.5)
Lymphs Abs: 2792 cells/uL (ref 850–3900)
MCH: 28.4 pg (ref 27.0–33.0)
MCHC: 33.2 g/dL (ref 32.0–36.0)
MCV: 85.4 fL (ref 80.0–100.0)
MPV: 9.2 fL (ref 7.5–12.5)
Monocytes Relative: 7.1 %
Neutro Abs: 6277 cells/uL (ref 1500–7800)
Neutrophils Relative %: 63.4 %
Platelets: 300 10*3/uL (ref 140–400)
RBC: 4.58 10*6/uL (ref 3.80–5.10)
RDW: 13.5 % (ref 11.0–15.0)
Total Lymphocyte: 28.2 %
WBC: 9.9 10*3/uL (ref 3.8–10.8)

## 2020-06-30 LAB — COMPLETE METABOLIC PANEL WITH GFR
AG Ratio: 1.4 (calc) (ref 1.0–2.5)
ALT: 11 U/L (ref 6–29)
AST: 12 U/L (ref 10–35)
Albumin: 4 g/dL (ref 3.6–5.1)
Alkaline phosphatase (APISO): 107 U/L (ref 37–153)
BUN: 15 mg/dL (ref 7–25)
CO2: 27 mmol/L (ref 20–32)
Calcium: 8.9 mg/dL (ref 8.6–10.4)
Chloride: 107 mmol/L (ref 98–110)
Creat: 0.78 mg/dL (ref 0.50–0.99)
GFR, Est African American: 94 mL/min/{1.73_m2} (ref >=60)
GFR, Est Non African American: 81 mL/min/{1.73_m2} (ref >=60)
Globulin: 2.9 g/dL (calc) (ref 1.9–3.7)
Glucose, Bld: 77 mg/dL (ref 65–99)
Potassium: 3.9 mmol/L (ref 3.5–5.3)
Sodium: 144 mmol/L (ref 135–146)
Total Bilirubin: 0.5 mg/dL (ref 0.2–1.2)
Total Protein: 6.9 g/dL (ref 6.1–8.1)

## 2020-06-30 NOTE — Progress Notes (Signed)
CBC and CMP WNL

## 2020-07-23 DIAGNOSIS — R7301 Impaired fasting glucose: Secondary | ICD-10-CM | POA: Diagnosis not present

## 2020-07-23 DIAGNOSIS — I5042 Chronic combined systolic (congestive) and diastolic (congestive) heart failure: Secondary | ICD-10-CM | POA: Diagnosis not present

## 2020-07-23 DIAGNOSIS — I110 Hypertensive heart disease with heart failure: Secondary | ICD-10-CM | POA: Diagnosis not present

## 2020-07-23 DIAGNOSIS — M797 Fibromyalgia: Secondary | ICD-10-CM | POA: Diagnosis not present

## 2020-08-03 ENCOUNTER — Other Ambulatory Visit: Payer: Self-pay | Admitting: Rheumatology

## 2020-08-03 NOTE — Telephone Encounter (Signed)
Last Visit: 06/29/2020 Next Visit: 09/28/2020 Labs: 06/29/2020 CBC and CMP WNL.  PLQ Eye Exam: 10/15/18 WNL  Patient has an appointment scheduled for 08/26/2020 for PLQ eye exam.   Current Dose per office note on 06/29/2020: Plaquenil 200 mg 1 tablet by mouth twice a day  Okay to refill PLQ?

## 2020-08-03 NOTE — Telephone Encounter (Signed)
Please notify patient that not getting her eye exams on a regular basis can cause ocular toxicity and vision loss.

## 2020-08-04 NOTE — Telephone Encounter (Signed)
Patient advised  that not getting her eye exams on a regular basis can cause ocular toxicity and vision loss.

## 2020-08-24 DIAGNOSIS — I110 Hypertensive heart disease with heart failure: Secondary | ICD-10-CM | POA: Diagnosis not present

## 2020-08-24 DIAGNOSIS — M797 Fibromyalgia: Secondary | ICD-10-CM | POA: Diagnosis not present

## 2020-08-24 DIAGNOSIS — R7301 Impaired fasting glucose: Secondary | ICD-10-CM | POA: Diagnosis not present

## 2020-08-24 DIAGNOSIS — I5042 Chronic combined systolic (congestive) and diastolic (congestive) heart failure: Secondary | ICD-10-CM | POA: Diagnosis not present

## 2020-08-26 ENCOUNTER — Encounter: Payer: Self-pay | Admitting: Rheumatology

## 2020-08-26 DIAGNOSIS — M069 Rheumatoid arthritis, unspecified: Secondary | ICD-10-CM | POA: Diagnosis not present

## 2020-08-26 DIAGNOSIS — Z79899 Other long term (current) drug therapy: Secondary | ICD-10-CM | POA: Diagnosis not present

## 2020-08-26 DIAGNOSIS — H2513 Age-related nuclear cataract, bilateral: Secondary | ICD-10-CM | POA: Diagnosis not present

## 2020-08-26 DIAGNOSIS — H04123 Dry eye syndrome of bilateral lacrimal glands: Secondary | ICD-10-CM | POA: Diagnosis not present

## 2020-08-26 DIAGNOSIS — H10413 Chronic giant papillary conjunctivitis, bilateral: Secondary | ICD-10-CM | POA: Diagnosis not present

## 2020-09-13 DIAGNOSIS — L700 Acne vulgaris: Secondary | ICD-10-CM | POA: Diagnosis not present

## 2020-09-14 NOTE — Progress Notes (Deleted)
Office Visit Note  Patient: Caroline Mason             Date of Birth: 03/11/1956           MRN: [id number]             PCP: Curlene Labrum, MD Referring: Curlene Labrum, MD Visit Date: 09/28/2020 Occupation: @GUAROCC @  Subjective:  No chief complaint on file.   History of Present Illness: Caroline Mason is a 64 y.o. female ***   Activities of Daily Living:  Patient reports morning stiffness for *** {minute/hour:19697}.   Patient {ACTIONS;DENIES/REPORTS:21021675::"Denies"} nocturnal pain.  Difficulty dressing/grooming: {ACTIONS;DENIES/REPORTS:21021675::"Denies"} Difficulty climbing stairs: {ACTIONS;DENIES/REPORTS:21021675::"Denies"} Difficulty getting out of chair: {ACTIONS;DENIES/REPORTS:21021675::"Denies"} Difficulty using hands for taps, buttons, cutlery, and/or writing: {ACTIONS;DENIES/REPORTS:21021675::"Denies"}  No Rheumatology ROS completed.   PMFS History:  Patient Active Problem List   Diagnosis Date Noted  . Ganglion cyst of wrist, right 08/14/2018  . Trigger thumb, right thumb 03/27/2018  . High risk medication use 12/01/2016  . Rheumatoid arthritis with rheumatoid factor of multiple sites without organ or systems involvement ([place]) 12/01/2016  . Fibromyalgia 12/01/2016  . Primary osteoarthritis of both hands 12/01/2016  . Primary osteoarthritis of both knees 12/01/2016  . Primary osteoarthritis of both feet 12/01/2016  . Bradycardia 07/18/2011  . Chronic systolic heart failure ([place]) 06/05/2011  . ANEMIA 03/01/2011  . TOBACCO ABUSE 01/03/2011  . OBSTRUCTIVE SLEEP APNEA 01/03/2011  . ESSENTIAL HYPERTENSION, BENIGN 01/03/2011  . Secondary cardiomyopathy ([place]) 01/03/2011  . COPD 01/03/2011    Past Medical History:  Diagnosis Date  . Atypical pneumonia    12/11  . Cardiomyopathy    Nonischemic, normal coronaries, LVEF 40-45%  . COPD (chronic obstructive pulmonary disease) ([place])   . Depression   . Essential hypertension   . Fibromyalgia   .  Morbid obesity ([place])   . Obstructive sleep apnea   . Rheumatoid arthritis(714.0)     Family History  Problem Relation Age of Onset  . Stroke Mother   . Hypertension Mother   . Heart failure Sister 61  . Breast cancer Sister   . Heart failure Sister 78  . Cancer Sister   . Ovarian cancer Sister   . Diabetes Maternal Grandmother   . Diabetes Paternal Grandfather    Past Surgical History:  Procedure Laterality Date  . CYST REMOVAL TRUNK    . TUBAL LIGATION  1989  . WRIST SURGERY  2016   Social History   Social History Narrative   Lives in [place].   Immunization History  Administered Date(s) Administered  . Influenza-Unspecified 09/24/2014, 10/25/2018     Objective: Vital Signs: There were no vitals taken for this visit.   Physical Exam   Musculoskeletal Exam: ***  CDAI Exam: CDAI Score: -- Patient Global: --; Provider Global: -- Swollen: --; Tender: -- Joint Exam 09/28/2020   No joint exam has been documented for this visit   There is currently no information documented on the homunculus. Go to the Rheumatology activity and complete the homunculus joint exam.  Investigation: No additional findings.  Imaging: No results found.  Recent Labs: Lab Results  Component Value Date   WBC 9.9 06/29/2020   HGB 13.0 06/29/2020   PLT 300 06/29/2020   NA 144 06/29/2020   K 3.9 06/29/2020   CL 107 06/29/2020   CO2 27 06/29/2020   GLUCOSE 77 06/29/2020   BUN 15 06/29/2020   CREATININE 0.78 06/29/2020   BILITOT 0.5 06/29/2020   ALKPHOS 102 12/05/2016  AST 12 06/29/2020   ALT 11 06/29/2020   PROT 6.9 06/29/2020   ALBUMIN 4.0 12/05/2016   CALCIUM 8.9 06/29/2020   GFRAA 94 06/29/2020    Speciality Comments: PLQ Eye Exam: 08/26/2020 WNL [facility] follow up 1 year  Procedures:  No procedures performed Allergies: Aspirin   Assessment / Plan:     Visit Diagnoses: Rheumatoid arthritis with rheumatoid factor of multiple sites without organ or  systems involvement (HCC)  High risk medication use  Primary osteoarthritis of both hands  Primary osteoarthritis of both knees  Primary osteoarthritis of both feet  Fibromyalgia  History of vitamin D deficiency  History of sleep apnea  History of COPD  History of anemia  Orders: No orders of the defined types were placed in this encounter.  No orders of the defined types were placed in this encounter.   Face-to-face time spent with patient was *** minutes. Greater than 50% of time was spent in counseling and coordination of care.  Follow-Up Instructions: No follow-ups on file.   [name], PA-C  Note - This record has been created using Dragon software.  Chart creation errors have been sought, but may not always  have been located. Such creation errors do not reflect on  the standard of medical care.

## 2020-09-23 DIAGNOSIS — I110 Hypertensive heart disease with heart failure: Secondary | ICD-10-CM | POA: Diagnosis not present

## 2020-09-23 DIAGNOSIS — M797 Fibromyalgia: Secondary | ICD-10-CM | POA: Diagnosis not present

## 2020-09-23 DIAGNOSIS — I5042 Chronic combined systolic (congestive) and diastolic (congestive) heart failure: Secondary | ICD-10-CM | POA: Diagnosis not present

## 2020-09-28 ENCOUNTER — Ambulatory Visit: Payer: Medicare Other | Admitting: Rheumatology

## 2020-09-29 ENCOUNTER — Other Ambulatory Visit: Payer: Self-pay

## 2020-09-29 DIAGNOSIS — Z79899 Other long term (current) drug therapy: Secondary | ICD-10-CM | POA: Diagnosis not present

## 2020-09-30 LAB — CBC WITH DIFFERENTIAL/PLATELET
Absolute Monocytes: 466 cells/uL (ref 200–950)
Basophils Absolute: 30 cells/uL (ref 0–200)
Basophils Relative: 0.4 %
Eosinophils Absolute: 67 cells/uL (ref 15–500)
Eosinophils Relative: 0.9 %
HCT: 40.3 % (ref 35.0–45.0)
Hemoglobin: 13.3 g/dL (ref 11.7–15.5)
Lymphs Abs: 1991 cells/uL (ref 850–3900)
MCH: 28.4 pg (ref 27.0–33.0)
MCHC: 33 g/dL (ref 32.0–36.0)
MCV: 86.1 fL (ref 80.0–100.0)
MPV: 9.8 fL (ref 7.5–12.5)
Monocytes Relative: 6.3 %
Neutro Abs: 4847 cells/uL (ref 1500–7800)
Neutrophils Relative %: 65.5 %
Platelets: 279 10*3/uL (ref 140–400)
RBC: 4.68 10*6/uL (ref 3.80–5.10)
RDW: 12.8 % (ref 11.0–15.0)
Total Lymphocyte: 26.9 %
WBC: 7.4 10*3/uL (ref 3.8–10.8)

## 2020-09-30 LAB — COMPLETE METABOLIC PANEL WITHOUT GFR
AG Ratio: 1.4 (calc) (ref 1.0–2.5)
ALT: 8 U/L (ref 6–29)
AST: 12 U/L (ref 10–35)
Albumin: 4.2 g/dL (ref 3.6–5.1)
Alkaline phosphatase (APISO): 108 U/L (ref 37–153)
BUN: 8 mg/dL (ref 7–25)
CO2: 25 mmol/L (ref 20–32)
Calcium: 9.3 mg/dL (ref 8.6–10.4)
Chloride: 106 mmol/L (ref 98–110)
Creat: 0.7 mg/dL (ref 0.50–0.99)
GFR, Est African American: 108 mL/min/1.73m2
GFR, Est Non African American: 93 mL/min/1.73m2
Globulin: 3.1 g/dL (ref 1.9–3.7)
Glucose, Bld: 103 mg/dL — ABNORMAL HIGH (ref 65–99)
Potassium: 4.1 mmol/L (ref 3.5–5.3)
Sodium: 142 mmol/L (ref 135–146)
Total Bilirubin: 0.7 mg/dL (ref 0.2–1.2)
Total Protein: 7.3 g/dL (ref 6.1–8.1)

## 2020-09-30 NOTE — Progress Notes (Signed)
CBC and CMP WNL

## 2020-10-23 DIAGNOSIS — R7301 Impaired fasting glucose: Secondary | ICD-10-CM | POA: Diagnosis not present

## 2020-10-23 DIAGNOSIS — I110 Hypertensive heart disease with heart failure: Secondary | ICD-10-CM | POA: Diagnosis not present

## 2020-10-23 DIAGNOSIS — M797 Fibromyalgia: Secondary | ICD-10-CM | POA: Diagnosis not present

## 2020-10-23 DIAGNOSIS — I5042 Chronic combined systolic (congestive) and diastolic (congestive) heart failure: Secondary | ICD-10-CM | POA: Diagnosis not present

## 2020-11-04 DIAGNOSIS — E782 Mixed hyperlipidemia: Secondary | ICD-10-CM | POA: Diagnosis not present

## 2020-11-04 DIAGNOSIS — R7301 Impaired fasting glucose: Secondary | ICD-10-CM | POA: Diagnosis not present

## 2020-11-04 DIAGNOSIS — I5022 Chronic systolic (congestive) heart failure: Secondary | ICD-10-CM | POA: Diagnosis not present

## 2020-11-04 DIAGNOSIS — K219 Gastro-esophageal reflux disease without esophagitis: Secondary | ICD-10-CM | POA: Diagnosis not present

## 2020-11-04 DIAGNOSIS — I10 Essential (primary) hypertension: Secondary | ICD-10-CM | POA: Diagnosis not present

## 2020-11-05 ENCOUNTER — Telehealth: Payer: Self-pay

## 2020-11-05 MED ORDER — HYDROXYCHLOROQUINE SULFATE 200 MG PO TABS: ORAL_TABLET | ORAL | 0 refills | Status: DC

## 2020-11-05 NOTE — Telephone Encounter (Signed)
Last Visit: 06/29/2020 Next Visit: due October 2021. Message sent to the front to schedule.  Labs: 06/29/2020 CBC and CMP WNL.  PLQ Eye Exam:08/26/2020 WNL  Current Dose per office note on 06/29/2020: Plaquenil 200 mg 1 tablet by mouth twice a day  Okay to refill per Dr. Estanislado Pandy

## 2020-11-05 NOTE — Telephone Encounter (Signed)
Please schedule patient for a follow up visit. Patient was due October 2021. Thanks!

## 2020-11-05 NOTE — Telephone Encounter (Signed)
Patient called requesting prescription refill of Hydroxychloroquine to be sent to [facility] at [address] in [place].  Patient states she is out of medication.

## 2020-11-09 DIAGNOSIS — E782 Mixed hyperlipidemia: Secondary | ICD-10-CM | POA: Diagnosis not present

## 2020-11-09 DIAGNOSIS — Z23 Encounter for immunization: Secondary | ICD-10-CM | POA: Diagnosis not present

## 2020-11-09 DIAGNOSIS — I5042 Chronic combined systolic (congestive) and diastolic (congestive) heart failure: Secondary | ICD-10-CM | POA: Diagnosis not present

## 2020-11-09 DIAGNOSIS — Z0001 Encounter for general adult medical examination with abnormal findings: Secondary | ICD-10-CM | POA: Diagnosis not present

## 2020-11-09 DIAGNOSIS — I10 Essential (primary) hypertension: Secondary | ICD-10-CM | POA: Diagnosis not present

## 2020-11-09 DIAGNOSIS — I428 Other cardiomyopathies: Secondary | ICD-10-CM | POA: Diagnosis not present

## 2020-11-15 ENCOUNTER — Other Ambulatory Visit: Payer: Self-pay | Admitting: Cardiology

## 2020-12-03 NOTE — Progress Notes (Signed)
Office Visit Note  Patient: Caroline Mason             Date of Birth: 08/25/1956           MRN: [id number]             PCP: Curlene Labrum, MD Referring: Curlene Labrum, MD Visit Date: 12/16/2020 Occupation: @GUAROCC @  Subjective:  Left knee pain.   History of Present Illness: Caroline Mason is a 64 y.o. female 3 of rheumatoid arthritis and osteoarthritis overlap.  She states she continues to have pain and discomfort in her joints.  She has been experiencing some numbness in her left hand.  She states the left knee joint pain started about 2 months ago and has been progressively getting worse.  She has been having difficulty walking.  She is occasional discomfort in her feet.  She has not noticed any joint swelling.  She has been having flare of fibromyalgia with increased generalized pain.  Activities of Daily Living:  Patient reports morning stiffness for 1 hour.   Patient Reports nocturnal pain.  Difficulty dressing/grooming: Denies Difficulty climbing stairs: Reports Difficulty getting out of chair: Reports Difficulty using hands for taps, buttons, cutlery, and/or writing: Reports  Review of Systems  Constitutional: Negative for fatigue.  HENT: Negative for mouth sores, mouth dryness and nose dryness.   Eyes: Positive for dryness. Negative for pain and itching.  Respiratory: Negative for shortness of breath and difficulty breathing.   Cardiovascular: Negative for chest pain and palpitations.  Gastrointestinal: Negative for blood in stool, constipation and diarrhea.  Endocrine: Negative for increased urination.  Genitourinary: Negative for difficulty urinating.  Musculoskeletal: Positive for arthralgias, joint pain, joint swelling, myalgias, morning stiffness, muscle tenderness and myalgias.  Skin: Negative for color change, rash and redness.  Allergic/Immunologic: Negative for susceptible to infections.  Neurological: Positive for numbness. Negative for  dizziness, headaches, memory loss and weakness.  Hematological: Negative for bruising/bleeding tendency.  Psychiatric/Behavioral: Positive for sleep disturbance. Negative for confusion.    PMFS History:  Patient Active Problem List   Diagnosis Date Noted  . Ganglion cyst of wrist, right 08/14/2018  . Trigger thumb, right thumb 03/27/2018  . High risk medication use 12/01/2016  . Rheumatoid arthritis with rheumatoid factor of multiple sites without organ or systems involvement ([place]) 12/01/2016  . Fibromyalgia 12/01/2016  . Primary osteoarthritis of both hands 12/01/2016  . Primary osteoarthritis of both knees 12/01/2016  . Primary osteoarthritis of both feet 12/01/2016  . Bradycardia 07/18/2011  . Chronic systolic heart failure ([place]) 06/05/2011  . ANEMIA 03/01/2011  . TOBACCO ABUSE 01/03/2011  . OBSTRUCTIVE SLEEP APNEA 01/03/2011  . ESSENTIAL HYPERTENSION, BENIGN 01/03/2011  . Secondary cardiomyopathy ([place]) 01/03/2011  . COPD 01/03/2011    Past Medical History:  Diagnosis Date  . Atypical pneumonia    12/11  . Cardiomyopathy    Nonischemic, normal coronaries, LVEF 40-45%  . COPD (chronic obstructive pulmonary disease) ([place])   . Depression   . Essential hypertension   . Fibromyalgia   . Morbid obesity ([place])   . Obstructive sleep apnea   . Rheumatoid arthritis(714.0)     Family History  Problem Relation Age of Onset  . Stroke Mother   . Hypertension Mother   . Heart failure Sister 34  . Breast cancer Sister   . Heart failure Sister 6  . Cancer Sister   . Ovarian cancer Sister   . Diabetes Maternal Grandmother   . Diabetes Paternal Grandfather  Past Surgical History:  Procedure Laterality Date  . CYST REMOVAL TRUNK    . TUBAL LIGATION  1989  . WRIST SURGERY  2016   Social History   Social History Narrative   Lives in [place].   Immunization History  Administered Date(s) Administered  . Influenza-Unspecified 09/24/2014, 10/25/2018  . Moderna  Sars-Covid-2 Vaccination 02/09/2020, 03/11/2020, 11/30/2020     Objective: Vital Signs: BP 132/83 (BP Location: Left Arm, Patient Position: Sitting, Cuff Size: Large)   Pulse (!) 59   Resp 17   Ht 5\' 5"  (1.651 m)   Wt 248 lb (112.5 kg)   BMI 41.27 kg/m    Physical Exam Vitals and nursing note reviewed.  Constitutional:      Appearance: She is well-developed and well-nourished.  HENT:     Head: Normocephalic and atraumatic.  Eyes:     Extraocular Movements: EOM normal.     Conjunctiva/sclera: Conjunctivae normal.  Cardiovascular:     Rate and Rhythm: Normal rate and regular rhythm.     Pulses: Intact distal pulses.     Heart sounds: Normal heart sounds.  Pulmonary:     Effort: Pulmonary effort is normal.     Breath sounds: Normal breath sounds.  Abdominal:     General: Bowel sounds are normal.     Palpations: Abdomen is soft.  Musculoskeletal:     Cervical back: Normal range of motion.  Lymphadenopathy:     Cervical: No cervical adenopathy.  Skin:    General: Skin is warm and dry.     Capillary Refill: Capillary refill takes less than 2 seconds.  Neurological:     Mental Status: She is alert and oriented to person, place, and time.  Psychiatric:        Mood and Affect: Mood and affect normal.        Behavior: Behavior normal.      Musculoskeletal Exam: Was in good range of motion.  Shoulder joints, elbow joints, wrist joints, MCPs PIPs and DIPs with good range of motion with no synovitis.  Hip joints with good range of motion.  She had warmth and tenderness on range of motion of her left knee joint.  There was no tenderness over ankles MTPs or PIPs of her feet.  CDAI Exam: CDAI Score: 3.1  Patient Global: 7 mm; Provider Global: 4 mm Swollen: 1 ; Tender: 1  Joint Exam 12/16/2020      Right  Left  Knee     Swollen Tender     Investigation: No additional findings.  Imaging: No results found.  Recent Labs: Lab Results  Component Value Date   WBC 7.4  09/29/2020   HGB 13.3 09/29/2020   PLT 279 09/29/2020   NA 142 09/29/2020   K 4.1 09/29/2020   CL 106 09/29/2020   CO2 25 09/29/2020   GLUCOSE 103 (H) 09/29/2020   BUN 8 09/29/2020   CREATININE 0.70 09/29/2020   BILITOT 0.7 09/29/2020   ALKPHOS 102 12/05/2016   AST 12 09/29/2020   ALT 8 09/29/2020   PROT 7.3 09/29/2020   ALBUMIN 4.0 12/05/2016   CALCIUM 9.3 09/29/2020   GFRAA 108 09/29/2020    Speciality Comments: PLQ Eye Exam: 08/26/2020 WNL [facility] follow up 1 year  Procedures:  Large Joint Inj: L knee on 12/16/2020 11:27 AM Indications: pain Details: 27 G 1.5 in needle, medial approach  Arthrogram: No  Medications: 40 mg triamcinolone acetonide 40 MG/ML; 1.5 mL lidocaine 1 % Aspirate: 0 mL  Outcome: tolerated well, no immediate complications Procedure, treatment alternatives, risks and benefits explained, specific risks discussed. Consent was given by the patient. Immediately prior to procedure a time out was called to verify the correct patient, procedure, equipment, support staff and site/side marked as required. Patient was prepped and draped in the usual sterile fashion.     Allergies: Aspirin   Assessment / Plan:     Visit Diagnoses: Rheumatoid arthritis with rheumatoid factor of multiple sites without organ or systems involvement (HCC) - +RF, synovitis: She has been clinically doing well.  She states she has intermittent flares with discomfort.  She had warmth and swelling in her left knee joint today.  She has been taking Plaquenil on a regular basis.  Her labs are stable and eye exam has been normal.  I discussed adding methotrexate to her regimen.  She would like to hold off and think about it the follow-up visit.  High risk medication use - Plaquenil 200 mg 1 tablet by mouth twice a day.PLQ Eye Exam: 08/26/2020 WNL [facility] follow up 1 year.  She is fully vaccinated against COVID-19.  She also received a booster.  Use of mask, social  distancing and hand hygiene was discussed.  Primary osteoarthritis of both hands-she had no synovitis on examination.  She had DIP and PIP thickening bilaterally.  Acute pain of left knee-she is been having increased pain and discomfort in her left knee joint for the last 2 months.  She has some warmth on palpation.  Per her request after informed consent was obtained left knee joint was injected with cortisone as described above.  She tolerated the procedure well.  Primary osteoarthritis of both knees -she has been having some chronic discomfort in her knee joints.  Primary osteoarthritis of both feet-she has no synovitis on examination.  Fibromyalgia-she continues to have some generalized pain and discomfort from fibromyalgia.  History of vitamin D deficiency  History of sleep apnea  History of COPD  History of anemia  Orders: Orders Placed This Encounter  Procedures  . Large Joint Inj: L knee   No orders of the defined types were placed in this encounter.     Follow-Up Instructions: Return in about 3 months (around 03/16/2021) for Rheumatoid arthritis, Osteoarthritis.   Bo Merino, MD  Note - This record has been created using Editor, commissioning.  Chart creation errors have been sought, but may not always  have been located. Such creation errors do not reflect on  the standard of medical care.

## 2020-12-16 ENCOUNTER — Encounter: Payer: Self-pay | Admitting: Rheumatology

## 2020-12-16 ENCOUNTER — Other Ambulatory Visit: Payer: Self-pay

## 2020-12-16 ENCOUNTER — Ambulatory Visit: Payer: Medicare Other | Admitting: Rheumatology

## 2020-12-16 VITALS — BP 132/83 | HR 59 | Resp 17 | Ht 65.0 in | Wt 248.0 lb

## 2020-12-16 DIAGNOSIS — Z79899 Other long term (current) drug therapy: Secondary | ICD-10-CM

## 2020-12-16 DIAGNOSIS — M19041 Primary osteoarthritis, right hand: Secondary | ICD-10-CM

## 2020-12-16 DIAGNOSIS — M797 Fibromyalgia: Secondary | ICD-10-CM

## 2020-12-16 DIAGNOSIS — M25562 Pain in left knee: Secondary | ICD-10-CM | POA: Diagnosis not present

## 2020-12-16 DIAGNOSIS — M19042 Primary osteoarthritis, left hand: Secondary | ICD-10-CM

## 2020-12-16 DIAGNOSIS — M170 Bilateral primary osteoarthritis of knee: Secondary | ICD-10-CM

## 2020-12-16 DIAGNOSIS — Z862 Personal history of diseases of the blood and blood-forming organs and certain disorders involving the immune mechanism: Secondary | ICD-10-CM

## 2020-12-16 DIAGNOSIS — Z8709 Personal history of other diseases of the respiratory system: Secondary | ICD-10-CM

## 2020-12-16 DIAGNOSIS — M19072 Primary osteoarthritis, left ankle and foot: Secondary | ICD-10-CM

## 2020-12-16 DIAGNOSIS — Z8639 Personal history of other endocrine, nutritional and metabolic disease: Secondary | ICD-10-CM

## 2020-12-16 DIAGNOSIS — M0579 Rheumatoid arthritis with rheumatoid factor of multiple sites without organ or systems involvement: Secondary | ICD-10-CM | POA: Diagnosis not present

## 2020-12-16 DIAGNOSIS — Z8669 Personal history of other diseases of the nervous system and sense organs: Secondary | ICD-10-CM

## 2020-12-16 DIAGNOSIS — M19071 Primary osteoarthritis, right ankle and foot: Secondary | ICD-10-CM

## 2020-12-16 MED ORDER — LIDOCAINE HCL 1 % IJ SOLN
1.5000 mL | INTRAMUSCULAR | Status: AC | PRN
  Administered 2020-12-16: 1.5 mL

## 2020-12-16 MED ORDER — TRIAMCINOLONE ACETONIDE 40 MG/ML IJ SUSP
40.0000 mg | INTRAMUSCULAR | Status: AC | PRN
  Administered 2020-12-16: 40 mg via INTRA_ARTICULAR

## 2020-12-16 NOTE — Patient Instructions (Signed)
Standing Labs °We placed an order today for your standing lab work.  ° °Please have your standing labs drawn in March ° °If possible, please have your labs drawn 2 weeks prior to your appointment so that the provider can discuss your results at your appointment. ° °We have open lab daily °Monday through Thursday from 8:30-12:30 PM and 1:30-4:30 PM and Friday from 8:30-12:30 PM and 1:30-4:00 PM °at the office of Dr. Edmonia Gonser, [facility].   °Please be advised, patients with office appointments requiring lab work will take precedents over walk-in lab work.  °If possible, please come for your lab work on Monday and Friday afternoons, as you may experience shorter wait times. °The office is located at [address], [address], [place], [place] [address] °No appointment is necessary.   °Labs are drawn by [facility]. Please bring your co-pay at the time of your lab draw.  You may receive a bill from [facility] for your lab work. ° °If you wish to have your labs drawn at another location, please call the office 24 hours in advance to send orders. ° °If you have any questions regarding directions or hours of operation,  °please call [phone].   °As a reminder, please drink plenty of water prior to coming for your lab work. Thanks! ° °

## 2020-12-24 DIAGNOSIS — I110 Hypertensive heart disease with heart failure: Secondary | ICD-10-CM | POA: Diagnosis not present

## 2020-12-24 DIAGNOSIS — M797 Fibromyalgia: Secondary | ICD-10-CM | POA: Diagnosis not present

## 2020-12-24 DIAGNOSIS — I5042 Chronic combined systolic (congestive) and diastolic (congestive) heart failure: Secondary | ICD-10-CM | POA: Diagnosis not present

## 2021-01-22 DIAGNOSIS — I5042 Chronic combined systolic (congestive) and diastolic (congestive) heart failure: Secondary | ICD-10-CM | POA: Diagnosis not present

## 2021-01-22 DIAGNOSIS — I110 Hypertensive heart disease with heart failure: Secondary | ICD-10-CM | POA: Diagnosis not present

## 2021-01-22 DIAGNOSIS — M797 Fibromyalgia: Secondary | ICD-10-CM | POA: Diagnosis not present

## 2021-01-22 DIAGNOSIS — R7301 Impaired fasting glucose: Secondary | ICD-10-CM | POA: Diagnosis not present

## 2021-02-21 DIAGNOSIS — I110 Hypertensive heart disease with heart failure: Secondary | ICD-10-CM | POA: Diagnosis not present

## 2021-02-21 DIAGNOSIS — M797 Fibromyalgia: Secondary | ICD-10-CM | POA: Diagnosis not present

## 2021-02-21 DIAGNOSIS — I5042 Chronic combined systolic (congestive) and diastolic (congestive) heart failure: Secondary | ICD-10-CM | POA: Diagnosis not present

## 2021-02-21 DIAGNOSIS — R7301 Impaired fasting glucose: Secondary | ICD-10-CM | POA: Diagnosis not present

## 2021-03-04 NOTE — Progress Notes (Deleted)
Office Visit Note  Patient: Caroline Mason             Date of Birth: Jun 24, 1956           MRN: [id number]             PCP: Curlene Labrum, MD Referring: Curlene Labrum, MD Visit Date: 03/17/2021 Occupation: @GUAROCC @  Subjective:  No chief complaint on file.   History of Present Illness: Caroline Mason is a 65 y.o. female ***   Activities of Daily Living:  Patient reports morning stiffness for *** {minute/hour:19697}.   Patient {ACTIONS;DENIES/REPORTS:21021675::"Denies"} nocturnal pain.  Difficulty dressing/grooming: {ACTIONS;DENIES/REPORTS:21021675::"Denies"} Difficulty climbing stairs: {ACTIONS;DENIES/REPORTS:21021675::"Denies"} Difficulty getting out of chair: {ACTIONS;DENIES/REPORTS:21021675::"Denies"} Difficulty using hands for taps, buttons, cutlery, and/or writing: {ACTIONS;DENIES/REPORTS:21021675::"Denies"}  No Rheumatology ROS completed.   PMFS History:  Patient Active Problem List   Diagnosis Date Noted  . Ganglion cyst of wrist, right 08/14/2018  . Trigger thumb, right thumb 03/27/2018  . High risk medication use 12/01/2016  . Rheumatoid arthritis with rheumatoid factor of multiple sites without organ or systems involvement ([place]) 12/01/2016  . Fibromyalgia 12/01/2016  . Primary osteoarthritis of both hands 12/01/2016  . Primary osteoarthritis of both knees 12/01/2016  . Primary osteoarthritis of both feet 12/01/2016  . Bradycardia 07/18/2011  . Chronic systolic heart failure ([place]) 06/05/2011  . ANEMIA 03/01/2011  . TOBACCO ABUSE 01/03/2011  . OBSTRUCTIVE SLEEP APNEA 01/03/2011  . ESSENTIAL HYPERTENSION, BENIGN 01/03/2011  . Secondary cardiomyopathy ([place]) 01/03/2011  . COPD 01/03/2011    Past Medical History:  Diagnosis Date  . Atypical pneumonia    12/11  . Cardiomyopathy    Nonischemic, normal coronaries, LVEF 40-45%  . COPD (chronic obstructive pulmonary disease) ([place])   . Depression   . Essential hypertension   . Fibromyalgia   .  Morbid obesity ([place])   . Obstructive sleep apnea   . Rheumatoid arthritis(714.0)     Family History  Problem Relation Age of Onset  . Stroke Mother   . Hypertension Mother   . Heart failure Sister 12  . Breast cancer Sister   . Heart failure Sister 56  . Cancer Sister   . Ovarian cancer Sister   . Diabetes Maternal Grandmother   . Diabetes Paternal Grandfather    Past Surgical History:  Procedure Laterality Date  . CYST REMOVAL TRUNK    . TUBAL LIGATION  1989  . WRIST SURGERY  2016   Social History   Social History Narrative   Lives in [place].   Immunization History  Administered Date(s) Administered  . Influenza-Unspecified 09/24/2014, 10/25/2018  . Moderna Sars-Covid-2 Vaccination 02/09/2020, 03/11/2020, 11/30/2020     Objective: Vital Signs: There were no vitals taken for this visit.   Physical Exam   Musculoskeletal Exam: ***  CDAI Exam: CDAI Score: -- Patient Global: --; Provider Global: -- Swollen: --; Tender: -- Joint Exam 03/17/2021   No joint exam has been documented for this visit   There is currently no information documented on the homunculus. Go to the Rheumatology activity and complete the homunculus joint exam.  Investigation: No additional findings.  Imaging: No results found.  Recent Labs: Lab Results  Component Value Date   WBC 7.4 09/29/2020   HGB 13.3 09/29/2020   PLT 279 09/29/2020   NA 142 09/29/2020   K 4.1 09/29/2020   CL 106 09/29/2020   CO2 25 09/29/2020   GLUCOSE 103 (H) 09/29/2020   BUN 8 09/29/2020   CREATININE 0.70 09/29/2020  BILITOT 0.7 09/29/2020   ALKPHOS 102 12/05/2016   AST 12 09/29/2020   ALT 8 09/29/2020   PROT 7.3 09/29/2020   ALBUMIN 4.0 12/05/2016   CALCIUM 9.3 09/29/2020   GFRAA 108 09/29/2020    Speciality Comments: PLQ Eye Exam: 08/26/2020 WNL [facility] follow up 1 year  Procedures:  No procedures performed Allergies: Aspirin   Assessment / Plan:     Visit Diagnoses: No  diagnosis found.  Orders: No orders of the defined types were placed in this encounter.  No orders of the defined types were placed in this encounter.   Face-to-face time spent with patient was *** minutes. Greater than 50% of time was spent in counseling and coordination of care.  Follow-Up Instructions: No follow-ups on file.   Earnestine Mealing, CMA  Note - This record has been created using Editor, commissioning.  Chart creation errors have been sought, but may not always  have been located. Such creation errors do not reflect on  the standard of medical care.

## 2021-03-16 NOTE — Progress Notes (Deleted)
Office Visit Note  Patient: Caroline Mason             Date of Birth: 1956-12-04           MRN: [id number]             PCP: Curlene Labrum, MD Referring: Curlene Labrum, MD Visit Date: 03/30/2021 Occupation: @GUAROCC @  Subjective:  No chief complaint on file.   History of Present Illness: RIELLY Mason is a 65 y.o. female ***   Activities of Daily Living:  Patient reports morning stiffness for *** {minute/hour:19697}.   Patient {ACTIONS;DENIES/REPORTS:21021675::"Denies"} nocturnal pain.  Difficulty dressing/grooming: {ACTIONS;DENIES/REPORTS:21021675::"Denies"} Difficulty climbing stairs: {ACTIONS;DENIES/REPORTS:21021675::"Denies"} Difficulty getting out of chair: {ACTIONS;DENIES/REPORTS:21021675::"Denies"} Difficulty using hands for taps, buttons, cutlery, and/or writing: {ACTIONS;DENIES/REPORTS:21021675::"Denies"}  No Rheumatology ROS completed.   PMFS History:  Patient Active Problem List   Diagnosis Date Noted  . Ganglion cyst of wrist, right 08/14/2018  . Trigger thumb, right thumb 03/27/2018  . High risk medication use 12/01/2016  . Rheumatoid arthritis with rheumatoid factor of multiple sites without organ or systems involvement ([place]) 12/01/2016  . Fibromyalgia 12/01/2016  . Primary osteoarthritis of both hands 12/01/2016  . Primary osteoarthritis of both knees 12/01/2016  . Primary osteoarthritis of both feet 12/01/2016  . Bradycardia 07/18/2011  . Chronic systolic heart failure ([place]) 06/05/2011  . ANEMIA 03/01/2011  . TOBACCO ABUSE 01/03/2011  . OBSTRUCTIVE SLEEP APNEA 01/03/2011  . ESSENTIAL HYPERTENSION, BENIGN 01/03/2011  . Secondary cardiomyopathy ([place]) 01/03/2011  . COPD 01/03/2011    Past Medical History:  Diagnosis Date  . Atypical pneumonia    12/11  . Cardiomyopathy    Nonischemic, normal coronaries, LVEF 40-45%  . COPD (chronic obstructive pulmonary disease) ([place])   . Depression   . Essential hypertension   . Fibromyalgia   .  Morbid obesity ([place])   . Obstructive sleep apnea   . Rheumatoid arthritis(714.0)     Family History  Problem Relation Age of Onset  . Stroke Mother   . Hypertension Mother   . Heart failure Sister 9  . Breast cancer Sister   . Heart failure Sister 27  . Cancer Sister   . Ovarian cancer Sister   . Diabetes Maternal Grandmother   . Diabetes Paternal Grandfather    Past Surgical History:  Procedure Laterality Date  . CYST REMOVAL TRUNK    . TUBAL LIGATION  1989  . WRIST SURGERY  2016   Social History   Social History Narrative   Lives in [place].   Immunization History  Administered Date(s) Administered  . Influenza-Unspecified 09/24/2014, 10/25/2018  . Moderna Sars-Covid-2 Vaccination 02/09/2020, 03/11/2020, 11/30/2020     Objective: Vital Signs: There were no vitals taken for this visit.   Physical Exam   Musculoskeletal Exam: ***  CDAI Exam: CDAI Score: -- Patient Global: --; Provider Global: -- Swollen: --; Tender: -- Joint Exam 03/30/2021   No joint exam has been documented for this visit   There is currently no information documented on the homunculus. Go to the Rheumatology activity and complete the homunculus joint exam.  Investigation: No additional findings.  Imaging: No results found.  Recent Labs: Lab Results  Component Value Date   WBC 7.4 09/29/2020   HGB 13.3 09/29/2020   PLT 279 09/29/2020   NA 142 09/29/2020   K 4.1 09/29/2020   CL 106 09/29/2020   CO2 25 09/29/2020   GLUCOSE 103 (H) 09/29/2020   BUN 8 09/29/2020   CREATININE 0.70 09/29/2020  BILITOT 0.7 09/29/2020   ALKPHOS 102 12/05/2016   AST 12 09/29/2020   ALT 8 09/29/2020   PROT 7.3 09/29/2020   ALBUMIN 4.0 12/05/2016   CALCIUM 9.3 09/29/2020   GFRAA 108 09/29/2020    Speciality Comments: PLQ Eye Exam: 08/26/2020 [facility] follow up 1 year  Procedures:  No procedures performed Allergies: Aspirin   Assessment / Plan:     Visit Diagnoses:  Rheumatoid arthritis with rheumatoid factor of multiple sites without organ or systems involvement (HCC)  High risk medication use  Primary osteoarthritis of both hands  Primary osteoarthritis of both knees  Primary osteoarthritis of both feet  Fibromyalgia  History of vitamin D deficiency  History of sleep apnea  History of COPD  History of anemia  Orders: No orders of the defined types were placed in this encounter.  No orders of the defined types were placed in this encounter.   Face-to-face time spent with patient was *** minutes. Greater than 50% of time was spent in counseling and coordination of care.  Follow-Up Instructions: No follow-ups on file.   [name], PA-C  Note - This record has been created using Dragon software.  Chart creation errors have been sought, but may not always  have been located. Such creation errors do not reflect on  the standard of medical care.

## 2021-03-17 ENCOUNTER — Ambulatory Visit: Payer: Medicare Other | Admitting: Physician Assistant

## 2021-03-17 DIAGNOSIS — M25562 Pain in left knee: Secondary | ICD-10-CM

## 2021-03-17 DIAGNOSIS — M797 Fibromyalgia: Secondary | ICD-10-CM

## 2021-03-17 DIAGNOSIS — M0579 Rheumatoid arthritis with rheumatoid factor of multiple sites without organ or systems involvement: Secondary | ICD-10-CM

## 2021-03-17 DIAGNOSIS — Z8639 Personal history of other endocrine, nutritional and metabolic disease: Secondary | ICD-10-CM

## 2021-03-17 DIAGNOSIS — Z79899 Other long term (current) drug therapy: Secondary | ICD-10-CM

## 2021-03-17 DIAGNOSIS — Z862 Personal history of diseases of the blood and blood-forming organs and certain disorders involving the immune mechanism: Secondary | ICD-10-CM

## 2021-03-17 DIAGNOSIS — M170 Bilateral primary osteoarthritis of knee: Secondary | ICD-10-CM

## 2021-03-17 DIAGNOSIS — Z8669 Personal history of other diseases of the nervous system and sense organs: Secondary | ICD-10-CM

## 2021-03-17 DIAGNOSIS — Z8709 Personal history of other diseases of the respiratory system: Secondary | ICD-10-CM

## 2021-03-17 DIAGNOSIS — M19042 Primary osteoarthritis, left hand: Secondary | ICD-10-CM

## 2021-03-17 DIAGNOSIS — M19071 Primary osteoarthritis, right ankle and foot: Secondary | ICD-10-CM

## 2021-03-23 DIAGNOSIS — R7301 Impaired fasting glucose: Secondary | ICD-10-CM | POA: Diagnosis not present

## 2021-03-23 DIAGNOSIS — I110 Hypertensive heart disease with heart failure: Secondary | ICD-10-CM | POA: Diagnosis not present

## 2021-03-23 DIAGNOSIS — M797 Fibromyalgia: Secondary | ICD-10-CM | POA: Diagnosis not present

## 2021-03-23 DIAGNOSIS — I5042 Chronic combined systolic (congestive) and diastolic (congestive) heart failure: Secondary | ICD-10-CM | POA: Diagnosis not present

## 2021-03-30 ENCOUNTER — Ambulatory Visit: Payer: Medicare Other | Admitting: Physician Assistant

## 2021-03-30 DIAGNOSIS — Z8639 Personal history of other endocrine, nutritional and metabolic disease: Secondary | ICD-10-CM

## 2021-03-30 DIAGNOSIS — M0579 Rheumatoid arthritis with rheumatoid factor of multiple sites without organ or systems involvement: Secondary | ICD-10-CM

## 2021-03-30 DIAGNOSIS — M797 Fibromyalgia: Secondary | ICD-10-CM

## 2021-03-30 DIAGNOSIS — Z8669 Personal history of other diseases of the nervous system and sense organs: Secondary | ICD-10-CM

## 2021-03-30 DIAGNOSIS — Z79899 Other long term (current) drug therapy: Secondary | ICD-10-CM

## 2021-03-30 DIAGNOSIS — Z862 Personal history of diseases of the blood and blood-forming organs and certain disorders involving the immune mechanism: Secondary | ICD-10-CM

## 2021-03-30 DIAGNOSIS — M170 Bilateral primary osteoarthritis of knee: Secondary | ICD-10-CM

## 2021-03-30 DIAGNOSIS — M19071 Primary osteoarthritis, right ankle and foot: Secondary | ICD-10-CM

## 2021-03-30 DIAGNOSIS — Z8709 Personal history of other diseases of the respiratory system: Secondary | ICD-10-CM

## 2021-03-30 DIAGNOSIS — M19041 Primary osteoarthritis, right hand: Secondary | ICD-10-CM

## 2021-04-23 DIAGNOSIS — M797 Fibromyalgia: Secondary | ICD-10-CM | POA: Diagnosis not present

## 2021-04-23 DIAGNOSIS — I5042 Chronic combined systolic (congestive) and diastolic (congestive) heart failure: Secondary | ICD-10-CM | POA: Diagnosis not present

## 2021-04-23 DIAGNOSIS — R7301 Impaired fasting glucose: Secondary | ICD-10-CM | POA: Diagnosis not present

## 2021-04-23 DIAGNOSIS — I110 Hypertensive heart disease with heart failure: Secondary | ICD-10-CM | POA: Diagnosis not present

## 2021-04-29 DIAGNOSIS — J40 Bronchitis, not specified as acute or chronic: Secondary | ICD-10-CM | POA: Diagnosis not present

## 2021-04-29 DIAGNOSIS — I5042 Chronic combined systolic (congestive) and diastolic (congestive) heart failure: Secondary | ICD-10-CM | POA: Diagnosis not present

## 2021-04-29 DIAGNOSIS — I429 Cardiomyopathy, unspecified: Secondary | ICD-10-CM | POA: Diagnosis not present

## 2021-04-29 DIAGNOSIS — F17200 Nicotine dependence, unspecified, uncomplicated: Secondary | ICD-10-CM | POA: Diagnosis not present

## 2021-04-29 DIAGNOSIS — J309 Allergic rhinitis, unspecified: Secondary | ICD-10-CM | POA: Diagnosis not present

## 2021-05-25 ENCOUNTER — Other Ambulatory Visit: Payer: Self-pay | Admitting: Cardiology

## 2021-06-23 DIAGNOSIS — E7849 Other hyperlipidemia: Secondary | ICD-10-CM | POA: Diagnosis not present

## 2021-06-23 DIAGNOSIS — I5022 Chronic systolic (congestive) heart failure: Secondary | ICD-10-CM | POA: Diagnosis not present

## 2021-06-23 DIAGNOSIS — E559 Vitamin D deficiency, unspecified: Secondary | ICD-10-CM | POA: Diagnosis not present

## 2021-06-23 DIAGNOSIS — E782 Mixed hyperlipidemia: Secondary | ICD-10-CM | POA: Diagnosis not present

## 2021-06-23 DIAGNOSIS — I10 Essential (primary) hypertension: Secondary | ICD-10-CM | POA: Diagnosis not present

## 2021-06-23 DIAGNOSIS — K219 Gastro-esophageal reflux disease without esophagitis: Secondary | ICD-10-CM | POA: Diagnosis not present

## 2021-06-23 DIAGNOSIS — R7301 Impaired fasting glucose: Secondary | ICD-10-CM | POA: Diagnosis not present

## 2021-06-28 DIAGNOSIS — M797 Fibromyalgia: Secondary | ICD-10-CM | POA: Diagnosis not present

## 2021-06-28 DIAGNOSIS — E7849 Other hyperlipidemia: Secondary | ICD-10-CM | POA: Diagnosis not present

## 2021-06-28 DIAGNOSIS — I10 Essential (primary) hypertension: Secondary | ICD-10-CM | POA: Diagnosis not present

## 2021-06-28 DIAGNOSIS — I5042 Chronic combined systolic (congestive) and diastolic (congestive) heart failure: Secondary | ICD-10-CM | POA: Diagnosis not present

## 2021-06-28 DIAGNOSIS — F17210 Nicotine dependence, cigarettes, uncomplicated: Secondary | ICD-10-CM | POA: Diagnosis not present

## 2021-06-28 DIAGNOSIS — I428 Other cardiomyopathies: Secondary | ICD-10-CM | POA: Diagnosis not present

## 2021-06-28 DIAGNOSIS — M0580 Other rheumatoid arthritis with rheumatoid factor of unspecified site: Secondary | ICD-10-CM | POA: Diagnosis not present

## 2021-06-28 DIAGNOSIS — R7301 Impaired fasting glucose: Secondary | ICD-10-CM | POA: Diagnosis not present

## 2021-06-29 ENCOUNTER — Other Ambulatory Visit: Payer: Self-pay | Admitting: Cardiology

## 2021-07-24 DIAGNOSIS — M797 Fibromyalgia: Secondary | ICD-10-CM | POA: Diagnosis not present

## 2021-07-24 DIAGNOSIS — I5042 Chronic combined systolic (congestive) and diastolic (congestive) heart failure: Secondary | ICD-10-CM | POA: Diagnosis not present

## 2021-07-24 DIAGNOSIS — I110 Hypertensive heart disease with heart failure: Secondary | ICD-10-CM | POA: Diagnosis not present

## 2021-07-24 DIAGNOSIS — R7301 Impaired fasting glucose: Secondary | ICD-10-CM | POA: Diagnosis not present

## 2021-08-04 ENCOUNTER — Telehealth: Payer: Self-pay

## 2021-08-04 NOTE — Progress Notes (Signed)
Office Visit Note  Patient: Caroline Mason             Date of Birth: 1956/12/22           MRN: [id number]             PCP: Curlene Labrum, MD Referring: Curlene Labrum, MD Visit Date: 08/05/2021 Occupation: '@GUAROCC'$ @  Subjective:  Pain in multiple joints.   History of Present Illness: Caroline Mason is a 65 y.o. female with a history of rheumatoid arthritis and osteoarthritis.  She states she ran out of Plaquenil and January 2022.  Since then she has been off Plaquenil.  Gradually her pain joint pain and swelling has increased.  Currently she has been having pain and discomfort in her left knee bilateral hands.  She notices swelling in her left hand and her left shoulder.  She states that she fell and landed on her left knee in December 2021.  Since then she has been having increased pain.  Activities of Daily Living:  Patient reports morning stiffness for 10-15 minutes.   Patient Reports nocturnal pain.  Difficulty dressing/grooming: Denies Difficulty climbing stairs: Reports Difficulty getting out of chair: Reports Difficulty using hands for taps, buttons, cutlery, and/or writing: Reports  Review of Systems  Constitutional:  Negative for fatigue.  HENT:  Negative for mouth sores, mouth dryness and nose dryness.   Eyes:  Positive for pain, redness, itching and dryness.  Respiratory:  Negative for shortness of breath and difficulty breathing.   Cardiovascular:  Negative for chest pain and palpitations.  Gastrointestinal:  Negative for blood in stool, constipation and diarrhea.  Endocrine: Negative for increased urination.  Genitourinary:  Negative for difficulty urinating.  Musculoskeletal:  Positive for joint pain, joint pain, joint swelling, myalgias, morning stiffness, muscle tenderness and myalgias.  Skin:  Negative for color change, rash, redness and sensitivity to sunlight.  Allergic/Immunologic: Negative for susceptible to infections.  Neurological:  Positive  for numbness. Negative for dizziness, headaches, memory loss and weakness.  Hematological:  Negative for bruising/bleeding tendency.  Psychiatric/Behavioral:  Negative for confusion.    PMFS History:  Patient Active Problem List   Diagnosis Date Noted   Ganglion cyst of wrist, right 08/14/2018   Trigger thumb, right thumb 03/27/2018   High risk medication use 12/01/2016   Rheumatoid arthritis with rheumatoid factor of multiple sites without organ or systems involvement ([place]) 12/01/2016   Fibromyalgia 12/01/2016   Primary osteoarthritis of both hands 12/01/2016   Primary osteoarthritis of both knees 12/01/2016   Primary osteoarthritis of both feet 12/01/2016   Bradycardia [id number]   Chronic systolic heart failure ([place]) 06/05/2011   ANEMIA 03/01/2011   TOBACCO ABUSE 01/03/2011   OBSTRUCTIVE SLEEP APNEA 01/03/2011   ESSENTIAL HYPERTENSION, BENIGN 01/03/2011   Secondary cardiomyopathy ([place]) 01/03/2011   COPD 01/03/2011    Past Medical History:  Diagnosis Date   Atypical pneumonia    12/11   Cardiomyopathy    Nonischemic, normal coronaries, LVEF 40-45%   COPD (chronic obstructive pulmonary disease) (HCC)    Depression    Essential hypertension    Fibromyalgia    Morbid obesity (HCC)    Obstructive sleep apnea    Rheumatoid arthritis(714.0)     Family History  Problem Relation Age of Onset   Stroke Mother    Hypertension Mother    Heart failure Sister 40   Breast cancer Sister    Heart failure Sister 18   Cancer Sister    Ovarian cancer  Sister    Diabetes Maternal Grandmother    Diabetes Paternal Grandfather    Past Surgical History:  Procedure Laterality Date   CYST REMOVAL TRUNK     TUBAL LIGATION  1989   WRIST SURGERY  2016   Social History   Social History Narrative   Lives in [place].   Immunization History  Administered Date(s) Administered   Influenza-Unspecified 09/24/2014, 10/25/2018   Moderna Sars-Covid-2 Vaccination 02/09/2020, 03/11/2020,  11/30/2020     Objective: Vital Signs: BP 131/82 (BP Location: Left Arm, Patient Position: Sitting, Cuff Size: Large)   Pulse 65   Ht '5\' 5"'$  (1.651 m)   Wt 240 lb (108.9 kg)   BMI 39.94 kg/m    Physical Exam Vitals and nursing note reviewed.  Constitutional:      Appearance: She is well-developed.  HENT:     Head: Normocephalic and atraumatic.  Eyes:     Conjunctiva/sclera: Conjunctivae normal.  Cardiovascular:     Rate and Rhythm: Normal rate and regular rhythm.     Heart sounds: Normal heart sounds.  Pulmonary:     Effort: Pulmonary effort is normal.     Breath sounds: Normal breath sounds.  Abdominal:     General: Bowel sounds are normal.     Palpations: Abdomen is soft.  Musculoskeletal:     Cervical back: Normal range of motion.  Lymphadenopathy:     Cervical: No cervical adenopathy.  Skin:    General: Skin is warm and dry.     Capillary Refill: Capillary refill takes less than 2 seconds.  Neurological:     Mental Status: She is alert and oriented to person, place, and time.  Psychiatric:        Behavior: Behavior normal.     Musculoskeletal Exam: C-spine was in good range of motion.  Shoulder joints, elbow joints, wrist joints with good range of motion.  She had tenderness over left first and second MCP joints.  No synovitis was noted.  Hip joints and knee joints with good range of motion.  She had warmth and swelling in her left knee joint with discomfort on range of motion.  There was no tenderness over ankles or MTPs.  CDAI Exam: CDAI Score: 5.8  Patient Global: 4 mm; Provider Global: 4 mm Swollen: 1 ; Tender: 4  Joint Exam 08/05/2021      Right  Left  MCP 1      Tender  MCP 2      Tender  MCP 3      Tender  Knee     Swollen Tender     Investigation: No additional findings.  Imaging: XR KNEE 3 VIEW LEFT  Result Date: 08/05/2021 Severe medial compartment narrowing was noted.  No chondrocalcinosis was noted.  No patellofemoral narrowing was noted.   No fracture was noted. Impression: These findings are consistent with severe osteoarthritis of the knee.   Recent Labs: Lab Results  Component Value Date   WBC 7.4 09/29/2020   HGB 13.3 09/29/2020   PLT 279 09/29/2020   NA 142 09/29/2020   K 4.1 09/29/2020   CL 106 09/29/2020   CO2 25 09/29/2020   GLUCOSE 103 (H) 09/29/2020   BUN 8 09/29/2020   CREATININE 0.70 09/29/2020   BILITOT 0.7 09/29/2020   ALKPHOS 102 12/05/2016   AST 12 09/29/2020   ALT 8 09/29/2020   PROT 7.3 09/29/2020   ALBUMIN 4.0 12/05/2016   CALCIUM 9.3 09/29/2020   GFRAA 108 09/29/2020  Speciality Comments: PLQ Eye Exam: 08/26/2020 [facility] follow up 1 year  Procedures:  Large Joint Inj: L knee on 08/05/2021 8:27 AM Indications: pain and joint swelling Details: 27 G 1.5 in needle, medial approach  Arthrogram: No  Medications: 60 mg triamcinolone acetonide 40 MG/ML; 1.5 mL lidocaine 1 % Aspirate: 0 mL Outcome: tolerated well, no immediate complications Procedure, treatment alternatives, risks and benefits explained, specific risks discussed. Consent was given by the patient. Immediately prior to procedure a time out was called to verify the correct patient, procedure, equipment, support staff and site/side marked as required. Patient was prepped and draped in the usual sterile fashion.    Allergies: Aspirin   Assessment / Plan:     Visit Diagnoses: Rheumatoid arthritis with rheumatoid factor of multiple sites without organ or systems involvement (HCC) - +RF, synovitis:  -She has been experiencing increased pain and discomfort in her joints.  She gives history of swelling in her left hand and her left knee.  Swelling was noted in the left knee joint.  She has some tenderness in her left hand.  She has been off Plaquenil since January.  We had detailed discussion regarding was given Plaquenil.  She was in agreement.  Side effects were reviewed.  A prescription of prednisone was sent to 200  mg p.o. twice daily.  Her last examination was in September.  She has been advised to get eye examination in September.  Plan: Sedimentation rate  High risk medication use - Plaquenil 200 mg 1 tablet by mouth twice a day.PLQ Eye Exam: 08/26/2020 - Plan: CBC with Differential/Platelet, COMPLETE METABOLIC PANEL WITH GFR today and then every 5 months.  Had updated information regarding realization was placed in the AVS.  Primary osteoarthritis of both hands-she has underlying osteoarthritis.  She states that she has been having intermittent swelling in her left hand.  There was some tenderness over MCPs but no synovitis was noted.  Acute pain of left knee - cortisone injection on 12/16/2020. -Patient had a fall in December.  She states she has been having increased pain and discomfort since December.  She had warmth and swelling without effusion on examination.  She had painful range of motion.  Plan: XR KNEE 3 VIEW LEFT.  X-ray showed severe osteoarthritis and no chondrocalcinosis.  Per patient's request the left knee joint was injected with cortisone which she tolerated well.  We discussed possible Visco supplement injections in the future.  Weight loss diet and exercise was discussed.  Lower extremity muscle strengthening exercises were discussed and a handout on exercises was given.  Primary osteoarthritis of both knees-she has history of osteoarthritis in bilateral knee joints.  She would benefit from lower extremity  Primary osteoarthritis of both feet-currently not symptomatic.  Fibromyalgia-she continues to have some generalized pain and discomfort.  Regular exercise was emphasized.  History of vitamin D deficiency-over-the-counter vitamin D supplement was discussed.  Patient states that she has been getting DEXA scan with her PCP.  I do not have results to review.  Other medical problems are listed as follows:  History of sleep apnea  History of anemia  History of COPD  Orders: Orders  Placed This Encounter  Procedures   Large Joint Inj   XR KNEE 3 VIEW LEFT   CBC with Differential/Platelet   COMPLETE METABOLIC PANEL WITH GFR   Sedimentation rate    Meds ordered this encounter  Medications   hydroxychloroquine (PLAQUENIL) 200 MG tablet    Sig:  TAKE 1 TABLET(200 MG) BY MOUTH TWICE DAILY    Dispense:  180 tablet    Refill:  0      Follow-Up Instructions: Return in about 3 months (around 11/05/2021) for Rheumatoid arthritis, Osteoarthritis.   Bo Merino, MD  Note - This record has been created using Editor, commissioning.  Chart creation errors have been sought, but may not always  have been located. Such creation errors do not reflect on  the standard of medical care.

## 2021-08-04 NOTE — Telephone Encounter (Signed)
Opened in error

## 2021-08-05 ENCOUNTER — Other Ambulatory Visit: Payer: Self-pay

## 2021-08-05 ENCOUNTER — Ambulatory Visit: Payer: Self-pay

## 2021-08-05 ENCOUNTER — Ambulatory Visit: Payer: Medicare Other | Admitting: Rheumatology

## 2021-08-05 ENCOUNTER — Encounter: Payer: Self-pay | Admitting: Rheumatology

## 2021-08-05 VITALS — BP 131/82 | HR 65 | Ht 65.0 in | Wt 240.0 lb

## 2021-08-05 DIAGNOSIS — M19041 Primary osteoarthritis, right hand: Secondary | ICD-10-CM | POA: Diagnosis not present

## 2021-08-05 DIAGNOSIS — Z862 Personal history of diseases of the blood and blood-forming organs and certain disorders involving the immune mechanism: Secondary | ICD-10-CM | POA: Diagnosis not present

## 2021-08-05 DIAGNOSIS — M19042 Primary osteoarthritis, left hand: Secondary | ICD-10-CM

## 2021-08-05 DIAGNOSIS — M19072 Primary osteoarthritis, left ankle and foot: Secondary | ICD-10-CM

## 2021-08-05 DIAGNOSIS — M0579 Rheumatoid arthritis with rheumatoid factor of multiple sites without organ or systems involvement: Secondary | ICD-10-CM

## 2021-08-05 DIAGNOSIS — M25562 Pain in left knee: Secondary | ICD-10-CM

## 2021-08-05 DIAGNOSIS — Z8639 Personal history of other endocrine, nutritional and metabolic disease: Secondary | ICD-10-CM | POA: Diagnosis not present

## 2021-08-05 DIAGNOSIS — Z8669 Personal history of other diseases of the nervous system and sense organs: Secondary | ICD-10-CM

## 2021-08-05 DIAGNOSIS — Z79899 Other long term (current) drug therapy: Secondary | ICD-10-CM

## 2021-08-05 DIAGNOSIS — M19071 Primary osteoarthritis, right ankle and foot: Secondary | ICD-10-CM

## 2021-08-05 DIAGNOSIS — Z8709 Personal history of other diseases of the respiratory system: Secondary | ICD-10-CM

## 2021-08-05 DIAGNOSIS — M170 Bilateral primary osteoarthritis of knee: Secondary | ICD-10-CM

## 2021-08-05 DIAGNOSIS — M797 Fibromyalgia: Secondary | ICD-10-CM | POA: Diagnosis not present

## 2021-08-05 MED ORDER — HYDROXYCHLOROQUINE SULFATE 200 MG PO TABS: ORAL_TABLET | ORAL | 0 refills | Status: DC

## 2021-08-05 MED ORDER — TRIAMCINOLONE ACETONIDE 40 MG/ML IJ SUSP
60.0000 mg | INTRAMUSCULAR | Status: AC | PRN
  Administered 2021-08-05: 60 mg via INTRA_ARTICULAR

## 2021-08-05 MED ORDER — LIDOCAINE HCL 1 % IJ SOLN
1.5000 mL | INTRAMUSCULAR | Status: AC | PRN
  Administered 2021-08-05: 1.5 mL

## 2021-08-05 NOTE — Patient Instructions (Signed)
Your next eye examination is due in September 2022.   Journal for Nurse Practitioners, 15(4), [phone]. Retrieved September 30, 2018 from http://clinicalkey.com/nursing">  Knee Exercises Ask your health care provider which exercises are safe for you. Do exercises exactly as told by your health care provider and adjust them as directed. It is normal to feel mild stretching, pulling, tightness, or discomfort as you do these exercises. Stop right away if you feel sudden pain or your pain gets worse. Do not begin these exercises until told by your health care provider. Stretching and range-of-motion exercises These exercises warm up your muscles and joints and improve the movement and flexibility of your knee. These exercises also help to relieve pain andswelling. Knee extension, prone Lie on your abdomen (prone position) on a bed. Place your left / right knee just beyond the edge of the surface so your knee is not on the bed. You can put a towel under your left / right thigh just above your kneecap for comfort. Relax your leg muscles and allow gravity to straighten your knee (extension). You should feel a stretch behind your left / right knee. Hold this position for __________ seconds. Scoot up so your knee is supported between repetitions. Repeat __________ times. Complete this exercise __________ times a day. Knee flexion, active  Lie on your back with both legs straight. If this causes back discomfort, bend your left / right knee so your foot is flat on the floor. Slowly slide your left / right heel back toward your buttocks. Stop when you feel a gentle stretch in the front of your knee or thigh (flexion). Hold this position for __________ seconds. Slowly slide your left / right heel back to the starting position. Repeat __________ times. Complete this exercise __________ times a day. Quadriceps stretch, prone  Lie on your abdomen on a firm surface, such as a bed or padded floor. Bend your left  / right knee and hold your ankle. If you cannot reach your ankle or pant leg, loop a belt around your foot and grab the belt instead. Gently pull your heel toward your buttocks. Your knee should not slide out to the side. You should feel a stretch in the front of your thigh and knee (quadriceps). Hold this position for __________ seconds. Repeat __________ times. Complete this exercise __________ times a day. Hamstring, supine Lie on your back (supine position). Loop a belt or towel over the ball of your left / right foot. The ball of your foot is on the walking surface, right under your toes. Straighten your left / right knee and slowly pull on the belt to raise your leg until you feel a gentle stretch behind your knee (hamstring). Do not let your knee bend while you do this. Keep your other leg flat on the floor. Hold this position for __________ seconds. Repeat __________ times. Complete this exercise __________ times a day. Strengthening exercises These exercises build strength and endurance in your knee. Endurance is theability to use your muscles for a long time, even after they get tired. Quadriceps, isometric This exercise stretches the muscles in front of your thigh (quadriceps) without moving your knee joint (isometric). Lie on your back with your left / right leg extended and your other knee bent. Put a rolled towel or small pillow under your knee if told by your health care provider. Slowly tense the muscles in the front of your left / right thigh. You should see your kneecap slide up toward your hip  or see increased dimpling just above the knee. This motion will push the back of the knee toward the floor. For __________ seconds, hold the muscle as tight as you can without increasing your pain. Relax the muscles slowly and completely. Repeat __________ times. Complete this exercise __________ times a day. Straight leg raises This exercise stretches the muscles in front of your thigh  (quadriceps) and the muscles that move your hips (hip flexors). Lie on your back with your left / right leg extended and your other knee bent. Tense the muscles in the front of your left / right thigh. You should see your kneecap slide up or see increased dimpling just above the knee. Your thigh may even shake a bit. Keep these muscles tight as you raise your leg 4-6 inches (10-15 cm) off the floor. Do not let your knee bend. Hold this position for __________ seconds. Keep these muscles tense as you lower your leg. Relax your muscles slowly and completely after each repetition. Repeat __________ times. Complete this exercise __________ times a day. Hamstring, isometric Lie on your back on a firm surface. Bend your left / right knee about __________ degrees. Dig your left / right heel into the surface as if you are trying to pull it toward your buttocks. Tighten the muscles in the back of your thighs (hamstring) to "dig" as hard as you can without increasing any pain. Hold this position for __________ seconds. Release the tension gradually and allow your muscles to relax completely for __________ seconds after each repetition. Repeat __________ times. Complete this exercise __________ times a day. Hamstring curls If told by your health care provider, do this exercise while wearing ankle weights. Begin with __________ lb weights. Then increase the weight by 1 lb (0.5 kg) increments. Do not wear ankle weights that are more than __________ lb. Lie on your abdomen with your legs straight. Bend your left / right knee as far as you can without feeling pain. Keep your hips flat against the floor. Hold this position for __________ seconds. Slowly lower your leg to the starting position. Repeat __________ times. Complete this exercise __________ times a day. Squats This exercise strengthens the muscles in front of your thigh and knee (quadriceps). Stand in front of a table, with your feet and knees  pointing straight ahead. You may rest your hands on the table for balance but not for support. Slowly bend your knees and lower your hips like you are going to sit in a chair. Keep your weight over your heels, not over your toes. Keep your lower legs upright so they are parallel with the table legs. Do not let your hips go lower than your knees. Do not bend lower than told by your health care provider. If your knee pain increases, do not bend as low. Hold the squat position for __________ seconds. Slowly push with your legs to return to standing. Do not use your hands to pull yourself to standing. Repeat __________ times. Complete this exercise __________ times a day. Wall slides This exercise strengthens the muscles in front of your thigh and knee (quadriceps). Lean your back against a smooth wall or door, and walk your feet out 18-24 inches (46-61 cm) from it. Place your feet hip-width apart. Slowly slide down the wall or door until your knees bend __________ degrees. Keep your knees over your heels, not over your toes. Keep your knees in line with your hips. Hold this position for __________ seconds. Repeat __________ times. Complete  this exercise __________ times a day. Straight leg raises This exercise strengthens the muscles that rotate the leg at the hip and move it away from your body (hip abductors). Lie on your side with your left / right leg in the top position. Lie so your head, shoulder, knee, and hip line up. You may bend your bottom knee to help you keep your balance. Roll your hips slightly forward so your hips are stacked directly over each other and your left / right knee is facing forward. Leading with your heel, lift your top leg 4-6 inches (10-15 cm). You should feel the muscles in your outer hip lifting. Do not let your foot drift forward. Do not let your knee roll toward the ceiling. Hold this position for __________ seconds. Slowly return your leg to the starting  position. Let your muscles relax completely after each repetition. Repeat __________ times. Complete this exercise __________ times a day. Straight leg raises This exercise stretches the muscles that move your hips away from the front of the pelvis (hip extensors). Lie on your abdomen on a firm surface. You can put a pillow under your hips if that is more comfortable. Tense the muscles in your buttocks and lift your left / right leg about 4-6 inches (10-15 cm). Keep your knee straight as you lift your leg. Hold this position for __________ seconds. Slowly lower your leg to the starting position. Let your leg relax completely after each repetition. Repeat __________ times. Complete this exercise __________ times a day. This information is not intended to replace advice given to you by your health care provider. Make sure you discuss any questions you have with your healthcare provider. Document Revised: 10/01/2018 Document Reviewed: 10/01/2018 Elsevier Patient Education  2022 [place].   Heart Disease Prevention   Your inflammatory disease increases your risk of heart disease which includes heart attack, stroke, atrial fibrillation (irregular heartbeats), high blood pressure, heart failure and atherosclerosis (plaque in the arteries).  It is important to reduce your risk by:   Keep blood pressure, cholesterol, and blood sugar at healthy levels   Smoking Cessation   Maintain a healthy weight  BMI 20-25   Eat a healthy diet  Plenty of fresh fruit, vegetables, and whole grains  Limit saturated fats, foods high in sodium, and added sugars  DASH and Mediterranean diet   Increase physical activity  Recommend moderate physically activity for 150 minutes per week/ 30 minutes a day for five days a week These can be broken up into three separate ten-minute sessions during the day.   Reduce Stress  Meditation, slow breathing exercises, yoga, coloring books  Dental visits twice a year    Vaccines You are taking a medication(s) that can suppress your immune system.  The following immunizations are recommended: Flu annually Covid-19  Td/Tdap (tetanus, diphtheria, pertussis) every 10 years Pneumonia (Prevnar 15 then Pneumovax 23 at least 1 year apart.  Alternatively, can take Prevnar 20 without needing additional dose) Shingrix (after age 53): 2 doses from 4 weeks to 6 months apart  Please check with your PCP to make sure you are up to date.

## 2021-08-06 LAB — CBC WITH DIFFERENTIAL/PLATELET
Absolute Monocytes: 550 cells/uL (ref 200–950)
Basophils Absolute: 17 cells/uL (ref 0–200)
Basophils Relative: 0.2 %
Eosinophils Absolute: 77 cells/uL (ref 15–500)
Eosinophils Relative: 0.9 %
HCT: 41.5 % (ref 35.0–45.0)
Hemoglobin: 13.1 g/dL (ref 11.7–15.5)
Lymphs Abs: 2193 cells/uL (ref 850–3900)
MCH: 27.9 pg (ref 27.0–33.0)
MCHC: 31.6 g/dL — ABNORMAL LOW (ref 32.0–36.0)
MCV: 88.5 fL (ref 80.0–100.0)
MPV: 9.5 fL (ref 7.5–12.5)
Monocytes Relative: 6.4 %
Neutro Abs: 5762 cells/uL (ref 1500–7800)
Neutrophils Relative %: 67 %
Platelets: 295 10*3/uL (ref 140–400)
RBC: 4.69 10*6/uL (ref 3.80–5.10)
RDW: 13 % (ref 11.0–15.0)
Total Lymphocyte: 25.5 %
WBC: 8.6 10*3/uL (ref 3.8–10.8)

## 2021-08-06 LAB — COMPLETE METABOLIC PANEL WITH GFR
AG Ratio: 1.5 (calc) (ref 1.0–2.5)
ALT: 9 U/L (ref 6–29)
AST: 11 U/L (ref 10–35)
Albumin: 4.4 g/dL (ref 3.6–5.1)
Alkaline phosphatase (APISO): 115 U/L (ref 37–153)
BUN: 12 mg/dL (ref 7–25)
CO2: 27 mmol/L (ref 20–32)
Calcium: 9.1 mg/dL (ref 8.6–10.4)
Chloride: 106 mmol/L (ref 98–110)
Creat: 0.61 mg/dL (ref 0.50–1.05)
Globulin: 2.9 g/dL (calc) (ref 1.9–3.7)
Glucose, Bld: 81 mg/dL (ref 65–99)
Potassium: 4.1 mmol/L (ref 3.5–5.3)
Sodium: 142 mmol/L (ref 135–146)
Total Bilirubin: 0.7 mg/dL (ref 0.2–1.2)
Total Protein: 7.3 g/dL (ref 6.1–8.1)
eGFR: 100 mL/min/{1.73_m2} (ref >=60)

## 2021-08-06 LAB — SEDIMENTATION RATE: Sed Rate: 25 mm/h (ref 0–30)

## 2021-08-07 NOTE — Progress Notes (Signed)
CBC, CMP and sed rate are normal.

## 2021-08-16 ENCOUNTER — Ambulatory Visit: Payer: Medicare Other | Admitting: Cardiology

## 2021-08-16 ENCOUNTER — Encounter: Payer: Self-pay | Admitting: Cardiology

## 2021-08-16 VITALS — BP 120/60 | HR 72 | Ht 65.0 in | Wt 239.0 lb

## 2021-08-16 DIAGNOSIS — I5042 Chronic combined systolic (congestive) and diastolic (congestive) heart failure: Secondary | ICD-10-CM

## 2021-08-16 DIAGNOSIS — I10 Essential (primary) hypertension: Secondary | ICD-10-CM

## 2021-08-16 DIAGNOSIS — I428 Other cardiomyopathies: Secondary | ICD-10-CM

## 2021-08-16 MED ORDER — CARVEDILOL 3.125 MG PO TABS: 3.1250 mg | ORAL_TABLET | Freq: Two times a day (BID) | ORAL | 1 refills | Status: DC

## 2021-08-16 NOTE — Patient Instructions (Signed)
Medication Instructions:  Your physician has recommended you make the following change in your medication:  Decrease carvedilol to 3.125 mg by mouth twice daily Continue other medications the same  Labwork: none  Testing/Procedures: Your physician has requested that you have an echocardiogram. Echocardiography is a painless test that uses sound waves to create images of your heart. It provides your doctor with information about the size and shape of your heart and how well your heart's chambers and valves are working. This procedure takes approximately one hour. There are no restrictions for this procedure.  Follow-Up: Your physician recommends that you schedule a follow-up appointment in: pending  Any Other Special Instructions Will Be Listed Below (If Applicable).  If you need a refill on your cardiac medications before your next appointment, please call your pharmacy.

## 2021-08-16 NOTE — Progress Notes (Signed)
Cardiology Office Note  Date: 08/16/2021   ID: Ben[place], Simison Feb 08, 1956, MRN [id number]  PCP:  Curlene Labrum, MD  Cardiologist:  Rozann Lesches, MD Electrophysiologist:  None   Chief Complaint  Patient presents with   Cardiac follow-up    History of Present Illness: Caroline Mason is a 65 y.o. female last assessed via telehealth encounter in November 2020.  She presents overdue for follow-up.  Symptomatically, doing well with NYHA class I dyspnea, no exertional chest pain, no significant weight gain or leg swelling.  I reviewed her medications.  She states that she ran out of carvedilol a few months ago.  Otherwise has been on stable therapy.  I personally reviewed her ECG today which shows sinus rhythm at 68 bpm with increased voltage and poor R wave progression.  Last echocardiogram was in July 2020 at which point LVEF was 45%.  We discussed getting an updated study.  I did review her recent lab work.  Past Medical History:  Diagnosis Date   Atypical pneumonia    12/11   Cardiomyopathy    Nonischemic, normal coronaries, LVEF 40-45%   COPD (chronic obstructive pulmonary disease) (HCC)    Depression    Essential hypertension    Fibromyalgia    Morbid obesity (HCC)    Obstructive sleep apnea    Rheumatoid arthritis(714.0)     Past Surgical History:  Procedure Laterality Date   CYST REMOVAL [place]     TUBAL LIGATION  1989   WRIST SURGERY  2016    Current Outpatient Medications  Medication Sig Dispense Refill   amLODipine (NORVASC) 10 MG tablet Take 10 mg by mouth daily.     atorvastatin (LIPITOR) 10 MG tablet Take 10 mg by mouth daily.     atorvastatin (LIPITOR) 10 MG tablet Take 10 mg by mouth daily.     carvedilol (COREG) 3.125 MG tablet Take 1 tablet (3.125 mg total) by mouth 2 (two) times daily. 180 tablet 1   Chlorpheniramine-PSE-Ibuprofen 2-30-200 MG TABS Take 1 tablet by mouth daily as needed.     cholecalciferol (VITAMIN D) 1000 units tablet  Take 1,000 Units by mouth daily.     docusate sodium (COLACE) 100 MG capsule Take 100 mg by mouth as needed.     furosemide (LASIX) 20 MG tablet Take 1 tablet (20 mg total) by mouth daily. 90 tablet 2   gabapentin (NEURONTIN) 300 MG capsule Take 300 mg by mouth 2 (two) times daily.     guaiFENesin-dextromethorphan (ROBITUSSIN DM) 100-10 MG/5ML syrup Take 5 mLs by mouth 3 (three) times daily as needed.     hydroxychloroquine (PLAQUENIL) 200 MG tablet TAKE 1 TABLET(200 MG) BY MOUTH TWICE DAILY 180 tablet 0   sacubitril-valsartan (ENTRESTO) 49-51 MG Take 1 tablet by mouth 2 (two) times daily. Start on Thursday 11/14/18 60 tablet 6   sertraline (ZOLOFT) 100 MG tablet Take 100 mg by mouth daily.     spironolactone (ALDACTONE) 25 MG tablet TAKE 1 TABLET(25 MG) BY MOUTH DAILY 90 tablet 0   No current facility-administered medications for this visit.   Allergies:  Aspirin   ROS: No palpitations or syncope.  Physical Exam: VS:  BP 120/60   Pulse 72   Ht '5\' 5"'$  (1.651 m)   Wt 239 lb (108.4 kg)   SpO2 97%   BMI 39.77 kg/m , BMI Body mass index is 39.77 kg/m.  Wt Readings from Last 3 Encounters:  08/16/21 239 lb (108.4 kg)  08/05/21  240 lb (108.9 kg)  12/16/20 248 lb (112.5 kg)    General: Patient appears comfortable at rest. HEENT: Conjunctiva and lids normal, wearing a mask. Neck: Supple, no elevated JVP or carotid bruits, no thyromegaly. Lungs: Clear to auscultation, nonlabored breathing at rest. Cardiac: Regular rate and rhythm, no S3, 1/6 systolic murmur, no pericardial rub. Abdomen: Soft, nontender, bowel sounds present. Extremities: No pitting edema.  ECG:  An ECG dated 09/27/2018 was personally reviewed today and demonstrated:  Sinus rhythm.  Recent Labwork: 08/05/2021: ALT 9; AST 11; BUN 12; Creat 0.61; Hemoglobin 13.1; Platelets 295; Potassium 4.1; Sodium 142   Other Studies Reviewed Today:  Echocardiogram 06/25/2019:  1. The left ventricle has a visually estimated ejection  fraction of 45%.  The cavity size was normal. There is moderately increased left ventricular  wall thickness. Left ventricular diastolic Doppler parameters are  consistent with impaired relaxation.   2. The right ventricle has normal systolic function. The cavity was  normal. There is no increase in right ventricular wall thickness.   3. Left atrial size was severely dilated.   4. No evidence of mitral valve stenosis.   5. The aortic valve has an indeterminate number of cusps. No stenosis of  the aortic valve. Mild aortic annular calcification noted.   6. Pulmonary hypertension is indeterminate, inadequate TR jet.   Assessment and Plan:  1.  History of nonischemic cardiomyopathy with chronic combined heart failure.  LVEF was 45% as of 2020.  Plan to refill Coreg although at 3.125 mg twice daily and otherwise continue Entresto, Aldactone, and Lasix.  Recent lab work reviewed.  Update echocardiogram and determine if further medication adjustments are needed.  2.  Essential hypertension, blood pressure is well controlled today.  She is also on Norvasc.  3.  Mixed hyperlipidemia, on Lipitor with follow-up by Dr. Pleas Koch.  Medication Adjustments/Labs and Tests Ordered: Current medicines are reviewed at length with the patient today.  Concerns regarding medicines are outlined above.   Tests Ordered: Orders Placed This Encounter  Procedures   EKG 12-Lead   ECHOCARDIOGRAM COMPLETE    Medication Changes: Meds ordered this encounter  Medications   carvedilol (COREG) 3.125 MG tablet    Sig: Take 1 tablet (3.125 mg total) by mouth 2 (two) times daily.    Dispense:  180 tablet    Refill:  1    08/16/2021 dose decrease     Disposition:  Follow up  test results.  Signed, Satira Sark, MD, [facility] 08/16/2021 4:35 PM    [facility], [place], [place] [address] Phone: [phone]; Fax: [phone]

## 2021-09-08 ENCOUNTER — Other Ambulatory Visit: Payer: Self-pay

## 2021-09-08 ENCOUNTER — Ambulatory Visit (HOSPITAL_COMMUNITY)
Admission: RE | Admit: 2021-09-08 | Discharge: 2021-09-08 | Disposition: A | Discharge status: Home / Self Care | Payer: Medicare Other | Source: Ambulatory Visit | Attending: Cardiology | Admitting: Cardiology

## 2021-09-08 ENCOUNTER — Telehealth: Payer: Self-pay | Admitting: *Deleted

## 2021-09-08 DIAGNOSIS — I428 Other cardiomyopathies: Secondary | ICD-10-CM | POA: Diagnosis not present

## 2021-09-08 LAB — ECHOCARDIOGRAM COMPLETE
Area-P 1/2: 3.21 cm2
S' Lateral: 4.1 cm

## 2021-09-08 NOTE — Progress Notes (Signed)
*  PRELIMINARY RESULTS* Echocardiogram 2D Echocardiogram has been performed.  Caroline Mason 09/08/2021, 12:09 PM

## 2021-09-08 NOTE — Telephone Encounter (Signed)
Patient informed. Copy sent to PCP °

## 2021-09-08 NOTE — Telephone Encounter (Signed)
-----   Message from Satira Sark, MD sent at 09/08/2021  4:04 PM EDT ----- Results reviewed.  LVEF shows some improvement compared to the prior study, now 45 to 50% range.  Would continue with current medications and schedule 65-monthfollow-up.

## 2021-10-14 DIAGNOSIS — Z1231 Encounter for screening mammogram for malignant neoplasm of breast: Secondary | ICD-10-CM | POA: Diagnosis not present

## 2021-10-21 NOTE — Progress Notes (Deleted)
Office Visit Note  Patient: Caroline Mason             Date of Birth: [birth year]           MRN: [id number]             PCP: Curlene Labrum, MD Referring: Curlene Labrum, MD Visit Date: 11/04/2021 Occupation: @GUAROCC @  Subjective:    History of Present Illness: Caroline Mason is a 65 y.o. female with history of seropositive rheumatoid arthritis, osteoarthritis, and fibromyalgia. She is taking plaquenil 200 mg 1 tablet by mouth twice daily.    PLQ Eye Exam: 08/26/2020 [facility] follow up 1 year.  She is overdue to update PLQ eye exam.  She was given a PLQ eye exam form to take with her to her upcoming appointment. CBC and CMP updated on 08/05/21.  She will be due to update lab work in January and every 3 months.    Activities of Daily Living:  Patient reports morning stiffness for *** {minute/hour:19697}.   Patient {ACTIONS;DENIES/REPORTS:21021675::"Denies"} nocturnal pain.  Difficulty dressing/grooming: {ACTIONS;DENIES/REPORTS:21021675::"Denies"} Difficulty climbing stairs: {ACTIONS;DENIES/REPORTS:21021675::"Denies"} Difficulty getting out of chair: {ACTIONS;DENIES/REPORTS:21021675::"Denies"} Difficulty using hands for taps, buttons, cutlery, and/or writing: {ACTIONS;DENIES/REPORTS:21021675::"Denies"}  No Rheumatology ROS completed.   PMFS History:  Patient Active Problem List   Diagnosis Date Noted   Ganglion cyst of wrist, right 08/14/2018   Trigger thumb, right thumb 03/27/2018   High risk medication use 12/01/2016   Rheumatoid arthritis with rheumatoid factor of multiple sites without organ or systems involvement ([place]) 12/01/2016   Fibromyalgia 12/01/2016   Primary osteoarthritis of both hands 12/01/2016   Primary osteoarthritis of both knees 12/01/2016   Primary osteoarthritis of both feet 12/01/2016   Bradycardia 51/70/0174   Chronic systolic heart failure ([place]) 06/05/2011   ANEMIA 03/01/2011   TOBACCO ABUSE 01/03/2011   OBSTRUCTIVE SLEEP  APNEA 01/03/2011   ESSENTIAL HYPERTENSION, BENIGN 01/03/2011   Secondary cardiomyopathy ([place]) 01/03/2011   COPD 01/03/2011    Past Medical History:  Diagnosis Date   Atypical pneumonia    12/11   Cardiomyopathy    Nonischemic, normal coronaries, LVEF 40-45%   COPD (chronic obstructive pulmonary disease) (HCC)    Depression    Essential hypertension    Fibromyalgia    Morbid obesity (HCC)    Obstructive sleep apnea    Rheumatoid arthritis(714.0)     Family History  Problem Relation Age of Onset   Stroke Mother    Hypertension Mother    Heart failure Sister 46   Breast cancer Sister    Heart failure Sister 67   Cancer Sister    Ovarian cancer Sister    Diabetes Maternal Grandmother    Diabetes Paternal Grandfather    Past Surgical History:  Procedure Laterality Date   CYST REMOVAL [place]   WRIST SURGERY  2016   Social History   Social History Narrative   Lives in [place].   Immunization History  Administered Date(s) Administered   Influenza-Unspecified 09/24/2014, 10/25/2018   Moderna Sars-Covid-2 Vaccination 02/09/2020, 03/11/2020, 11/30/2020     Objective: Vital Signs: There were no vitals taken for this visit.   Physical Exam Vitals and nursing note reviewed.  Constitutional:      Appearance: She is well-developed.  HENT:     Head: Normocephalic and atraumatic.  Eyes:     Conjunctiva/sclera: Conjunctivae normal.  Pulmonary:     Effort: Pulmonary effort is normal.  Abdominal:  Palpations: Abdomen is soft.  Musculoskeletal:     Cervical back: Normal range of motion.  Skin:    General: Skin is warm and dry.     Capillary Refill: Capillary refill takes less than 2 seconds.  Neurological:     Mental Status: She is alert and oriented to person, place, and time.  Psychiatric:        Behavior: Behavior normal.     Musculoskeletal Exam: ***  CDAI Exam: CDAI Score: -- Patient Global: --; Provider Global: -- Swollen:  --; Tender: -- Joint Exam 11/04/2021   No joint exam has been documented for this visit   There is currently no information documented on the homunculus. Go to the Rheumatology activity and complete the homunculus joint exam.  Investigation: No additional findings.  Imaging: No results found.  Recent Labs: Lab Results  Component Value Date   WBC 8.6 08/05/2021   HGB 13.1 08/05/2021   PLT 295 08/05/2021   NA 142 08/05/2021   K 4.1 08/05/2021   CL 106 08/05/2021   CO2 27 08/05/2021   GLUCOSE 81 08/05/2021   BUN 12 08/05/2021   CREATININE 0.61 08/05/2021   BILITOT 0.7 08/05/2021   ALKPHOS 102 12/05/2016   AST 11 08/05/2021   ALT 9 08/05/2021   PROT 7.3 08/05/2021   ALBUMIN 4.0 12/05/2016   CALCIUM 9.1 08/05/2021   GFRAA 108 09/29/2020    Speciality Comments: PLQ Eye Exam: 08/26/2020 [facility] follow up 1 year  Procedures:  No procedures performed Allergies: Aspirin   Assessment / Plan:     Visit Diagnoses: No diagnosis found.  Orders: No orders of the defined types were placed in this encounter.  No orders of the defined types were placed in this encounter.   Face-to-face time spent with patient was *** minutes. Greater than 50% of time was spent in counseling and coordination of care.  Follow-Up Instructions: No follow-ups on file.   Earnestine Mealing, CMA  Note - This record has been created using Editor, commissioning.  Chart creation errors have been sought, but may not always  have been located. Such creation errors do not reflect on  the standard of medical care.

## 2021-11-03 NOTE — Progress Notes (Deleted)
Office Visit Note  Patient: Caroline Mason             Date of Birth: 26-Apr-1956           MRN: [id number]             PCP: Curlene Labrum, MD Referring: Curlene Labrum, MD Visit Date: 11/08/2021 Occupation: @GUAROCC @  Subjective:  No chief complaint on file.   History of Present Illness: Caroline Mason is a 65 y.o. female ***   Activities of Daily Living:  Patient reports morning stiffness for *** {minute/hour:19697}.   Patient {ACTIONS;DENIES/REPORTS:21021675::"Denies"} nocturnal pain.  Difficulty dressing/grooming: {ACTIONS;DENIES/REPORTS:21021675::"Denies"} Difficulty climbing stairs: {ACTIONS;DENIES/REPORTS:21021675::"Denies"} Difficulty getting out of chair: {ACTIONS;DENIES/REPORTS:21021675::"Denies"} Difficulty using hands for taps, buttons, cutlery, and/or writing: {ACTIONS;DENIES/REPORTS:21021675::"Denies"}  No Rheumatology ROS completed.   PMFS History:  Patient Active Problem List   Diagnosis Date Noted   Ganglion cyst of wrist, right 08/14/2018   Trigger thumb, right thumb 03/27/2018   High risk medication use 12/01/2016   Rheumatoid arthritis with rheumatoid factor of multiple sites without organ or systems involvement ([place]) 12/01/2016   Fibromyalgia 12/01/2016   Primary osteoarthritis of both hands 12/01/2016   Primary osteoarthritis of both knees 12/01/2016   Primary osteoarthritis of both feet 12/01/2016   Bradycardia 69/48/5462   Chronic systolic heart failure ([place]) 06/05/2011   ANEMIA 03/01/2011   TOBACCO ABUSE 01/03/2011   OBSTRUCTIVE SLEEP APNEA 01/03/2011   ESSENTIAL HYPERTENSION, BENIGN 01/03/2011   Secondary cardiomyopathy ([place]) 01/03/2011   COPD 01/03/2011    Past Medical History:  Diagnosis Date   Atypical pneumonia    12/11   Cardiomyopathy    Nonischemic, normal coronaries, LVEF 40-45%   COPD (chronic obstructive pulmonary disease) (HCC)    Depression    Essential hypertension    Fibromyalgia    Morbid obesity (HCC)     Obstructive sleep apnea    Rheumatoid arthritis(714.0)     Family History  Problem Relation Age of Onset   Stroke Mother    Hypertension Mother    Heart failure Sister 82   Breast cancer Sister    Heart failure Sister 60   Cancer Sister    Ovarian cancer Sister    Diabetes Maternal Grandmother    Diabetes Paternal Grandfather    Past Surgical History:  Procedure Laterality Date   CYST REMOVAL [place]   WRIST SURGERY  2016   Social History   Social History Narrative   Lives in [place].   Immunization History  Administered Date(s) Administered   Influenza-Unspecified 09/24/2014, 10/25/2018   Moderna Sars-Covid-2 Vaccination 02/09/2020, 03/11/2020, 11/30/2020     Objective: Vital Signs: There were no vitals taken for this visit.   Physical Exam   Musculoskeletal Exam: ***  CDAI Exam: CDAI Score: -- Patient Global: --; Provider Global: -- Swollen: --; Tender: -- Joint Exam 11/08/2021   No joint exam has been documented for this visit   There is currently no information documented on the homunculus. Go to the Rheumatology activity and complete the homunculus joint exam.  Investigation: No additional findings.  Imaging: No results found.  Recent Labs: Lab Results  Component Value Date   WBC 8.6 08/05/2021   HGB 13.1 08/05/2021   PLT 295 08/05/2021   NA 142 08/05/2021   K 4.1 08/05/2021   CL 106 08/05/2021   CO2 27 08/05/2021   GLUCOSE 81 08/05/2021   BUN 12 08/05/2021   CREATININE 0.61 08/05/2021  BILITOT 0.7 08/05/2021   ALKPHOS 102 12/05/2016   AST 11 08/05/2021   ALT 9 08/05/2021   PROT 7.3 08/05/2021   ALBUMIN 4.0 12/05/2016   CALCIUM 9.1 08/05/2021   GFRAA 108 09/29/2020    Speciality Comments: PLQ Eye Exam: 08/26/2020 [facility] follow up 1 year  Procedures:  No procedures performed Allergies: Aspirin   Assessment / Plan:     Visit Diagnoses: No diagnosis found.  Orders: No orders of the  defined types were placed in this encounter.  No orders of the defined types were placed in this encounter.   Face-to-face time spent with patient was *** minutes. Greater than 50% of time was spent in counseling and coordination of care.  Follow-Up Instructions: No follow-ups on file.   Earnestine Mealing, CMA  Note - This record has been created using Editor, commissioning.  Chart creation errors have been sought, but may not always  have been located. Such creation errors do not reflect on  the standard of medical care.

## 2021-11-04 ENCOUNTER — Ambulatory Visit: Payer: Medicare Other | Admitting: Physician Assistant

## 2021-11-04 DIAGNOSIS — Z862 Personal history of diseases of the blood and blood-forming organs and certain disorders involving the immune mechanism: Secondary | ICD-10-CM

## 2021-11-04 DIAGNOSIS — Z8709 Personal history of other diseases of the respiratory system: Secondary | ICD-10-CM

## 2021-11-04 DIAGNOSIS — M19071 Primary osteoarthritis, right ankle and foot: Secondary | ICD-10-CM

## 2021-11-04 DIAGNOSIS — M797 Fibromyalgia: Secondary | ICD-10-CM

## 2021-11-04 DIAGNOSIS — M19042 Primary osteoarthritis, left hand: Secondary | ICD-10-CM

## 2021-11-04 DIAGNOSIS — M170 Bilateral primary osteoarthritis of knee: Secondary | ICD-10-CM

## 2021-11-04 DIAGNOSIS — Z79899 Other long term (current) drug therapy: Secondary | ICD-10-CM

## 2021-11-04 DIAGNOSIS — M0579 Rheumatoid arthritis with rheumatoid factor of multiple sites without organ or systems involvement: Secondary | ICD-10-CM

## 2021-11-04 DIAGNOSIS — Z8639 Personal history of other endocrine, nutritional and metabolic disease: Secondary | ICD-10-CM

## 2021-11-04 DIAGNOSIS — Z8669 Personal history of other diseases of the nervous system and sense organs: Secondary | ICD-10-CM

## 2021-11-04 DIAGNOSIS — M25562 Pain in left knee: Secondary | ICD-10-CM

## 2021-11-08 ENCOUNTER — Ambulatory Visit: Payer: Medicare Other | Admitting: Physician Assistant

## 2021-11-08 NOTE — Progress Notes (Signed)
Office Visit Note  Patient: Caroline Mason             Date of Birth: Feb 04, 1956           MRN: [id number]             PCP: Curlene Labrum, MD Referring: Curlene Labrum, MD Visit Date: 11/22/2021 Occupation: _0 @  Subjective:  Right shoulder joint pain   History of Present Illness: Caroline Mason is a 65 y.o. female with history of seropositive rheumatoid arthritis, osteoarthritis, and fibromyalgia.  Patient is taking Plaquenil 200 mg 1 tablet by mouth twice daily.  She restarted on Plaquenil after her last office visit on 08/05/2021.  Patient reports that she often misses her second dose of Plaquenil 3 to 4 days/week.  She has been tolerating Plaquenil without any side effects.  She reports that she continues to have chronic pain in both knee joints.  She notices intermittent swelling in the left knee.  She states that she is interested in applying for Visco gel injections since the cortisone injections only provide temporary relief.  She presents today with increased pain in the right shoulder joint after a fall 2 months ago.  She states that at times she feels a crunching in her right shoulder and has difficulty raising her right arm.  She is been experiencing severe pain at nigh and pain consistently throughout the day.  She has tried ibuprofen but had minimal pain relief.  Overall she is unsure if Plaquenil is effective at managing her rheumatoid arthritis. She denies any recent infections.     Activities of Daily Living:  Patient reports morning stiffness for couple hours  Patient Reports nocturnal pain.  Difficulty dressing/grooming: Reports Difficulty climbing stairs: Reports Difficulty getting out of chair: Reports Difficulty using hands for taps, buttons, cutlery, and/or writing: Denies  Review of Systems  Constitutional:  Positive for fatigue.  HENT:  Negative for mouth sores, mouth dryness and nose dryness.   Eyes:  Positive for dryness. Negative for pain  and visual disturbance.  Respiratory:  Negative for cough, hemoptysis, shortness of breath and difficulty breathing.   Cardiovascular:  Negative for chest pain, palpitations, hypertension and swelling in legs/feet.  Gastrointestinal:  Negative for blood in stool, constipation and diarrhea.  Endocrine: Negative for increased urination.  Genitourinary:  Negative for painful urination.  Musculoskeletal:  Positive for joint pain, joint pain, joint swelling, myalgias, morning stiffness, muscle tenderness and myalgias. Negative for muscle weakness.  Skin:  Negative for color change, pallor, rash, hair loss, nodules/bumps, skin tightness, ulcers and sensitivity to sunlight.  Allergic/Immunologic: Negative for susceptible to infections.  Neurological:  Negative for dizziness, numbness, headaches and weakness.  Hematological:  Negative for swollen glands.  Psychiatric/Behavioral:  Positive for depressed mood and sleep disturbance. The patient is not nervous/anxious.    PMFS History:  Patient Active Problem List   Diagnosis Date Noted   Ganglion cyst of wrist, right 08/14/2018   Trigger thumb, right thumb 03/27/2018   High risk medication use 12/01/2016   Rheumatoid arthritis with rheumatoid factor of multiple sites without organ or systems involvement ([place]) 12/01/2016   Fibromyalgia 12/01/2016   Primary osteoarthritis of both hands 12/01/2016   Primary osteoarthritis of both knees 12/01/2016   Primary osteoarthritis of both feet 12/01/2016   Bradycardia 82/50/0370   Chronic systolic heart failure ([place]) 06/05/2011   ANEMIA 03/01/2011   TOBACCO ABUSE 01/03/2011   OBSTRUCTIVE SLEEP APNEA 01/03/2011   ESSENTIAL HYPERTENSION, BENIGN 01/03/2011  Secondary cardiomyopathy ([place]) 01/03/2011   COPD 01/03/2011    Past Medical History:  Diagnosis Date   Atypical pneumonia    12/11   Cardiomyopathy    Nonischemic, normal coronaries, LVEF 40-45%   COPD (chronic obstructive pulmonary disease) (HCC)     Depression    Essential hypertension    Fibromyalgia    Morbid obesity (HCC)    Obstructive sleep apnea    Rheumatoid arthritis(714.0)     Family History  Problem Relation Age of Onset   Stroke Mother    Hypertension Mother    Heart failure Sister 42   Breast cancer Sister    Heart failure Sister 106   Cancer Sister    Ovarian cancer Sister    Diabetes Maternal Grandmother    Diabetes Paternal Grandfather    Past Surgical History:  Procedure Laterality Date   CYST REMOVAL [place]     TUBAL LIGATION  1989   WRIST SURGERY  2016   Social History   Social History Narrative   Lives in [place].   Immunization History  Administered Date(s) Administered   Influenza-Unspecified 09/24/2014, 10/25/2018   Moderna Sars-Covid-2 Vaccination 02/09/2020, 03/11/2020, 11/30/2020     Objective: Vital Signs: BP 138/84 (BP Location: Left Arm, Patient Position: Sitting, Cuff Size: Large)   Pulse 66   Resp 13   Ht _0  (1.651 m)   Wt 241 lb 12.8 oz (109.7 kg)   BMI 40.24 kg/m    Physical Exam Vitals and nursing note reviewed.  Constitutional:      Appearance: She is well-developed.  HENT:     Head: Normocephalic and atraumatic.  Eyes:     Conjunctiva/sclera: Conjunctivae normal.  Pulmonary:     Effort: Pulmonary effort is normal.  Abdominal:     Palpations: Abdomen is soft.  Musculoskeletal:     Cervical back: Normal range of motion.  Skin:    General: Skin is warm and dry.     Capillary Refill: Capillary refill takes less than 2 seconds.  Neurological:     Mental Status: She is alert and oriented to person, place, and time.  Psychiatric:        Behavior: Behavior normal.     Musculoskeletal Exam: C-spine, thoracic spine, and lumbar  spine have good ROM.  Painful and limited ROM of the right shoulder to about 90 degrees abduction.  Limited internal rotation of the right shoulder.  Left shoulder has good ROM with no discomfort.  Elbow joints, wrist joints, MCPs, PIPs,  and DIPs good ROM with no synovitis.  Complete fist formation bilaterally.  Hip joints have good ROM with no groin pain.  Limited extension and warmth of the left knee.  Right knee joint has good ROM with no warmth or effusion.  Ankle joints have good ROM with no tenderness or joint swelling.   CDAI Exam: CDAI Score: 4.2  Patient Global: 7 mm; Provider Global: 5 mm Swollen: 0 ; Tender: 3  Joint Exam 11/22/2021      Right  Left  Glenohumeral   Tender     Knee   Tender   Tender     Investigation: No additional findings.  Imaging: No results found.  Recent Labs: Lab Results  Component Value Date   WBC 8.6 08/05/2021   HGB 13.1 08/05/2021   PLT 295 08/05/2021   NA 142 08/05/2021   K 4.1 08/05/2021   CL 106 08/05/2021   CO2 27 08/05/2021   GLUCOSE 81 08/05/2021   BUN  12 08/05/2021   CREATININE 0.61 08/05/2021   BILITOT 0.7 08/05/2021   ALKPHOS 102 12/05/2016   AST 11 08/05/2021   ALT 9 08/05/2021   PROT 7.3 08/05/2021   ALBUMIN 4.0 12/05/2016   CALCIUM 9.1 08/05/2021   GFRAA 108 09/29/2020    Speciality Comments: PLQ Eye Exam: 08/26/2020 [facility] follow up 1 year  Procedures:  Large Joint Inj: R glenohumeral on 11/22/2021 2:14 PM Indications: pain Details: 27 G 1.5 in needle, posterior approach  Arthrogram: No  Medications: 1.5 mL lidocaine 1 %; 40 mg triamcinolone acetonide 40 MG/ML Aspirate: 0 mL Outcome: tolerated well, no immediate complications Procedure, treatment alternatives, risks and benefits explained, specific risks discussed. Consent was given by the patient. Immediately prior to procedure a time out was called to verify the correct patient, procedure, equipment, support staff and site/side marked as required. Patient was prepped and draped in the usual sterile fashion.    Allergies: Aspirin    Assessment / Plan:     Visit Diagnoses: Rheumatoid arthritis with rheumatoid factor of multiple sites without organ or systems  involvement (HCC) -  +RF, synovitis: She has no synovitis on examination today.  She is currently prescribed Plaquenil 200 mg 1 tablet by mouth twice daily.  She was restarted on Plaquenil after her last office visit on 08/05/2021.  She has been tolerating Plaquenil without any side effects but she has been missing the second dose of PLQ 3-4 days per week. She is unsure if PLQ is adequately managing her rheumatoid arthritis.  She continues to have pain in both knee joints due to underlying osteoarthritis. We will be applying for visco gel injections for both knees. She has also been experiencing increased pain in the right shoulder for the past 2 months after a fall.  X-rays of the right shoulder were obtained today. The right shoulder was injected with cortisone.  She has intermittent pain in the left hand but had no synovitis on examination today.  She had tenderness over both CMC joints.  ESR and CRP will be updated today.  We will also check a hydroxychloroquine blood level today.  Discussed the importance of remaining complaint taking PLQ as prescribed.  She was advised to notify us if her symptoms persist or worsen.  She will follow up in 2 months.   Plan: Sedimentation rate, Hydroxychloroquine, Blood, C-reactive protein  High risk medication use - Plaquenil 200 mg 1 tablet by mouth twice a day.  CBC and CMP were drawn on 08/05/2021.  CBC and CMP were updated today.  PLQ Eye Exam: 08/26/2020 [facility] follow up 1 year. Her PLQ eye exam is scheduled for 01/17/22.  She was given a Plaquenil eye exam to take with her to her upcoming appointment.  Hydroxychloroquine blood level will be updated tod  - Plan: CBC with Differential/Platelet, COMPLETE METABOLIC PANEL WITH GFR, Hydroxychloroquine, Blood  Primary osteoarthritis of both hands: He has PIP and DIP thickening consistent with osteoarthritis of both hands.  Tenderness over both CMC joints noted.  Complete fist formation  bilaterally.Discussed the importance of joint protection and muscle strengthening.  Chronic right shoulder pain -She presents today with right shoulder pain which started about 2 months ago after a fall. She did not hear a pop or crack during the fall. She was not evaluated initially after the fall.  Her discomfort has progressively been worsening and she has noticed more limited range of motion of her right arm.  She  is also experiencing nocturnal pain on a nightly basis.  She has tried taking ibuprofen with minimal relief.  X-rays of the right shoulder were updated today which were consistent with [facility] joint arthritis.  Different treatment options were discussed today.  After informed consent the right glenohumeral joint was injected with cortisone.  Procedure note was completed above.  Aftercare was discussed.  She was advised to notify us if her discomfort persists or worsens.  She was given a handout of shoulder exercises to perform once her symptoms have resolved to start to improve her range of motion and strengthening.  Plan: XR Shoulder Right  Primary osteoarthritis of both knees: She has chronic pain in both knee joints.  Her pain is most severe in the left knee.  X-rays of the left knee were updated on 08/05/2021 which were consistent with severe osteoarthritis.  She had a left knee joint cortisone injection performed at that time which provided temporary relief.  She is interested in proceeding with visco gel injections for both knees. She was given a handout of knee exercises to perform in the meantime.  Discussed the importance of lower extremity muscle strengthening.   This patient is diagnosed with osteoarthritis of the knee(s).    Radiographs show evidence of joint space narrowing, osteophytes, subchondral sclerosis and/or subchondral cysts.  This patient has knee pain which interferes with functional and activities of daily living.    This patient has experienced inadequate response, adverse  effects and/or intolerance with conservative treatments such as acetaminophen, NSAIDS, topical creams, physical therapy or regular exercise, knee bracing and/or weight loss.   This patient has experienced inadequate response or has a contraindication to intra articular steroid injections for at least 3 months.   This patient is not scheduled to have a total knee replacement within 6 months of starting treatment with viscosupplementation.  Primary osteoarthritis of both feet: She is not having any increased discomfort in her feet at this time.  She has good ROM of both ankle joints with no tenderness or joint swelling.   Fibromyalgia: She has generalized hyperalgesia and positive tender points on examination.  She remains on gabapentin as prescribed.  Other medical conditions are listed as follows:   History of vitamin D deficiency: She is taking vitamin D 1000 units daily.   History of COPD  History of anemia  History of sleep apnea   Orders: Orders Placed This Encounter  Procedures   XR Shoulder Right   CBC with Differential/Platelet   COMPLETE METABOLIC PANEL WITH GFR   Sedimentation rate   Hydroxychloroquine, Blood   C-reactive protein   No orders of the defined types were placed in this encounter.     Follow-Up Instructions: Return in about 2 months (around 01/22/2022) for Rheumatoid arthritis, Osteoarthritis, Fibromyalgia.   [name], PA-C  Note - This record has been created using Dragon software.  Chart creation errors have been sought, but may not always  have been located. Such creation errors do not reflect on  the standard of medical care.

## 2021-11-22 ENCOUNTER — Telehealth: Payer: Self-pay

## 2021-11-22 ENCOUNTER — Ambulatory Visit: Payer: Medicare Other | Admitting: Physician Assistant

## 2021-11-22 ENCOUNTER — Other Ambulatory Visit: Payer: Self-pay

## 2021-11-22 ENCOUNTER — Ambulatory Visit: Payer: Self-pay

## 2021-11-22 ENCOUNTER — Encounter: Payer: Self-pay | Admitting: Physician Assistant

## 2021-11-22 VITALS — BP 138/84 | HR 66 | Resp 13 | Ht 65.0 in | Wt 241.8 lb

## 2021-11-22 DIAGNOSIS — Z8669 Personal history of other diseases of the nervous system and sense organs: Secondary | ICD-10-CM

## 2021-11-22 DIAGNOSIS — Z79899 Other long term (current) drug therapy: Secondary | ICD-10-CM

## 2021-11-22 DIAGNOSIS — Z862 Personal history of diseases of the blood and blood-forming organs and certain disorders involving the immune mechanism: Secondary | ICD-10-CM

## 2021-11-22 DIAGNOSIS — G8929 Other chronic pain: Secondary | ICD-10-CM

## 2021-11-22 DIAGNOSIS — M19071 Primary osteoarthritis, right ankle and foot: Secondary | ICD-10-CM

## 2021-11-22 DIAGNOSIS — M797 Fibromyalgia: Secondary | ICD-10-CM

## 2021-11-22 DIAGNOSIS — M19041 Primary osteoarthritis, right hand: Secondary | ICD-10-CM | POA: Diagnosis not present

## 2021-11-22 DIAGNOSIS — M25562 Pain in left knee: Secondary | ICD-10-CM | POA: Diagnosis not present

## 2021-11-22 DIAGNOSIS — M19042 Primary osteoarthritis, left hand: Secondary | ICD-10-CM

## 2021-11-22 DIAGNOSIS — Z8709 Personal history of other diseases of the respiratory system: Secondary | ICD-10-CM | POA: Diagnosis not present

## 2021-11-22 DIAGNOSIS — M0579 Rheumatoid arthritis with rheumatoid factor of multiple sites without organ or systems involvement: Secondary | ICD-10-CM

## 2021-11-22 DIAGNOSIS — M25511 Pain in right shoulder: Secondary | ICD-10-CM

## 2021-11-22 DIAGNOSIS — Z8639 Personal history of other endocrine, nutritional and metabolic disease: Secondary | ICD-10-CM | POA: Diagnosis not present

## 2021-11-22 DIAGNOSIS — M170 Bilateral primary osteoarthritis of knee: Secondary | ICD-10-CM

## 2021-11-22 DIAGNOSIS — M19072 Primary osteoarthritis, left ankle and foot: Secondary | ICD-10-CM

## 2021-11-22 MED ORDER — LIDOCAINE HCL 1 % IJ SOLN
1.5000 mL | INTRAMUSCULAR | Status: AC | PRN
Start: 2021-11-22 — End: 2021-11-22
  Administered 2021-11-22: 1.5 mL

## 2021-11-22 MED ORDER — TRIAMCINOLONE ACETONIDE 40 MG/ML IJ SUSP
40.0000 mg | INTRAMUSCULAR | Status: AC | PRN
Start: 2021-11-22 — End: 2021-11-22
  Administered 2021-11-22: 40 mg via INTRA_ARTICULAR

## 2021-11-22 NOTE — Telephone Encounter (Signed)
Please apply for bilateral knee visco, per Taylor Dale, PA-C. Thanks!  

## 2021-11-22 NOTE — Patient Instructions (Addendum)
Knee Exercises Ask your health care provider which exercises are safe for you. Do exercises exactly as told by your health care provider and adjust them as directed. It is normal to feel mild stretching, pulling, tightness, or discomfort as you do these exercises. Stop right away if you feel sudden pain or your pain gets worse. Do not begin these exercises until told by your health care provider. Stretching and range-of-motion exercises These exercises warm up your muscles and joints and improve the movement and flexibility of your knee. These exercises also help to relieve pain and swelling. Knee extension, prone  Lie on your abdomen (prone position) on a bed. Place your left / right knee just beyond the edge of the surface so your knee is not on the bed. You can put a towel under your left / right thigh just above your kneecap for comfort. Relax your leg muscles and allow gravity to straighten your knee (extension). You should feel a stretch behind your left / right knee. Hold this position for __________ seconds. Scoot up so your knee is supported between repetitions. Repeat __________ times. Complete this exercise __________ times a day. Knee flexion, active  Lie on your back with both legs straight. If this causes back discomfort, bend your left / right knee so your foot is flat on the floor. Slowly slide your left / right heel back toward your buttocks. Stop when you feel a gentle stretch in the front of your knee or thigh (flexion). Hold this position for __________ seconds. Slowly slide your left / right heel back to the starting position. Repeat __________ times. Complete this exercise __________ times a day. Quadriceps stretch, prone  Lie on your abdomen on a firm surface, such as a bed or padded floor. Bend your left / right knee and hold your ankle. If you cannot reach your ankle or pant leg, loop a belt around your foot and grab the belt instead. Gently pull your heel toward  your buttocks. Your knee should not slide out to the side. You should feel a stretch in the front of your thigh and knee (quadriceps). Hold this position for __________ seconds. Repeat __________ times. Complete this exercise __________ times a day. Hamstring, supine  Lie on your back (supine position). Loop a belt or towel over the ball of your left / right foot. The ball of your foot is on the walking surface, right under your toes. Straighten your left / right knee and slowly pull on the belt to raise your leg until you feel a gentle stretch behind your knee (hamstring). Do not let your knee bend while you do this. Keep your other leg flat on the floor. Hold this position for __________ seconds. Repeat __________ times. Complete this exercise __________ times a day. Strengthening exercises These exercises build strength and endurance in your knee. Endurance is the ability to use your muscles for a long time, even after they get tired. Quadriceps, isometric This exercise strengthens the muscles in front of your thigh (quadriceps) without moving your knee joint (isometric). Lie on your back with your left / right leg extended and your other knee bent. Put a rolled towel or small pillow under your knee if told by your health care provider. Slowly tense the muscles in the front of your left / right thigh. You should see your kneecap slide up toward your hip or see increased dimpling just above the knee. This motion will push the back of the knee toward the floor.  For __________ seconds, hold the muscle as tight as you can without increasing your pain. Relax the muscles slowly and completely. Repeat __________ times. Complete this exercise __________ times a day. Straight leg raises This exercise strengthens the muscles in front of your thigh (quadriceps) and the muscles that move your hips (hip flexors). Lie on your back with your left / right leg extended and your other knee bent. Tense the  muscles in the front of your left / right thigh. You should see your kneecap slide up or see increased dimpling just above the knee. Your thigh may even shake a bit. Keep these muscles tight as you raise your leg 4-6 inches (10-15 cm) off the floor. Do not let your knee bend. Hold this position for __________ seconds. Keep these muscles tense as you lower your leg. Relax your muscles slowly and completely after each repetition. Repeat __________ times. Complete this exercise __________ times a day. Hamstring, isometric  Lie on your back on a firm surface. Bend your left / right knee about __________ degrees. Dig your left / right heel into the surface as if you are trying to pull it toward your buttocks. Tighten the muscles in the back of your thighs (hamstring) to "dig" as hard as you can without increasing any pain. Hold this position for __________ seconds. Release the tension gradually and allow your muscles to relax completely for __________ seconds after each repetition. Repeat __________ times. Complete this exercise __________ times a day. Hamstring curls If told by your health care provider, do this exercise while wearing ankle weights. Begin with __________lb / kg weights. Then increase the weight by 1 lb (0.5 kg) increments. Do not wear ankle weights that are more than __________lb / kg. Lie on your abdomen with your legs straight. Bend your left / right knee as far as you can without feeling pain. Keep your hips flat against the floor. Hold this position for __________ seconds. Slowly lower your leg to the starting position. Repeat __________ times. Complete this exercise __________ times a day. Squats This exercise strengthens the muscles in front of your thigh and knee (quadriceps). Stand in front of a table, with your feet and knees pointing straight ahead. You may rest your hands on the table for balance but not for support. Slowly bend your knees and lower your hips like you  are going to sit in a chair. Keep your weight over your heels, not over your toes. Keep your lower legs upright so they are parallel with the table legs. Do not let your hips go lower than your knees. Do not bend lower than told by your health care provider. If your knee pain increases, do not bend as low. Hold the squat position for __________ seconds. Slowly push with your legs to return to standing. Do not use your hands to pull yourself to standing. Repeat __________ times. Complete this exercise __________ times a day. Wall slides This exercise strengthens the muscles in front of your thigh and knee (quadriceps). Lean your back against a smooth wall or door, and walk your feet out 18-24 inches (46-61 cm) from it. Place your feet hip-width apart. Slowly slide down the wall or door until your knees bend __________ degrees. Keep your knees over your heels, not over your toes. Keep your knees in line with your hips. Hold this position for __________ seconds. Repeat __________ times. Complete this exercise __________ times a day. Straight leg raises, side-lying This exercise strengthens the muscles that rotate   the leg at the hip and move it away from your body (hip abductors). Lie on your side with your left / right leg in the top position. Lie so your head, shoulder, knee, and hip line up. You may bend your bottom knee to help you keep your balance. Roll your hips slightly forward so your hips are stacked directly over each other and your left / right knee is facing forward. Leading with your heel, lift your top leg 4-6 inches (10-15 cm). You should feel the muscles in your outer hip lifting. Do not let your foot drift forward. Do not let your knee roll toward the ceiling. Hold this position for __________ seconds. Slowly return your leg to the starting position. Let your muscles relax completely after each repetition. Repeat __________ times. Complete this exercise __________ times a  day. Straight leg raises, prone This exercise stretches the muscles that move your hips away from the front of the pelvis (hip extensors). Lie on your abdomen on a firm surface. You can put a pillow under your hips if that is more comfortable. Tense the muscles in your buttocks and lift your left / right leg about 4-6 inches (10-15 cm). Keep your knee straight as you lift your leg. Hold this position for __________ seconds. Slowly lower your leg to the starting position. Let your leg relax completely after each repetition. Repeat __________ times. Complete this exercise __________ times a day. This information is not intended to replace advice given to you by your health care provider. Make sure you discuss any questions you have with your health care provider. Document Revised: 08/23/2021 Document Reviewed: 08/23/2021 [employer] Patient Education  [place] Exercises Hand exercises can be helpful for almost anyone. These exercises can strengthen the hands, improve flexibility and movement, and increase blood flow to the hands. These results can make work and daily tasks easier. Hand exercises can be especially helpful for people who have joint pain from arthritis or have nerve damage from overuse (carpal tunnel syndrome). These exercises can also help people who have injured a hand. Exercises Most of these hand exercises are gentle stretching and motion exercises. It is usually safe to do them often throughout the day. Warming up your hands before exercise may help to reduce stiffness. You can do this with gentle massage or by placing your hands in warm water for 10-15 minutes. It is normal to feel some stretching, pulling, tightness, or mild discomfort as you begin new exercises. This will gradually improve. Stop an exercise right away if you feel sudden, severe pain or your pain gets worse. Ask your health care provider which exercises are best for you. Knuckle bend or "claw"  fist  Stand or sit with your arm, hand, and all five fingers pointed straight up. Make sure to keep your wrist straight during the exercise. Gently bend your fingers down toward your palm until the tips of your fingers are touching the top of your palm. Keep your big knuckle straight and just bend the small knuckles in your fingers. Hold this position for __________ seconds. Straighten (extend) your fingers back to the starting position. Repeat this exercise 5-10 times with each hand. Full finger fist  Stand or sit with your arm, hand, and all five fingers pointed straight up. Make sure to keep your wrist straight during the exercise. Gently bend your fingers into your palm until the tips of your fingers are touching the middle of your palm. Hold this position for __________  seconds. Extend your fingers back to the starting position, stretching every joint fully. Repeat this exercise 5-10 times with each hand. Straight fist Stand or sit with your arm, hand, and all five fingers pointed straight up. Make sure to keep your wrist straight during the exercise. Gently bend your fingers at the big knuckle, where your fingers meet your hand, and the middle knuckle. Keep the knuckle at the tips of your fingers straight and try to touch the bottom of your palm. Hold this position for __________ seconds. Extend your fingers back to the starting position, stretching every joint fully. Repeat this exercise 5-10 times with each hand. Tabletop  Stand or sit with your arm, hand, and all five fingers pointed straight up. Make sure to keep your wrist straight during the exercise. Gently bend your fingers at the big knuckle, where your fingers meet your hand, as far down as you can while keeping the small knuckles in your fingers straight. Think of forming a tabletop with your fingers. Hold this position for __________ seconds. Extend your fingers back to the starting position, stretching every joint  fully. Repeat this exercise 5-10 times with each hand. Finger spread  Place your hand flat on a table with your palm facing down. Make sure your wrist stays straight as you do this exercise. Spread your fingers and thumb apart from each other as far as you can until you feel a gentle stretch. Hold this position for __________ seconds. Bring your fingers and thumb tight together again. Hold this position for __________ seconds. Repeat this exercise 5-10 times with each hand. Making circles  Stand or sit with your arm, hand, and all five fingers pointed straight up. Make sure to keep your wrist straight during the exercise. Make a circle by touching the tip of your thumb to the tip of your index finger. Hold for __________ seconds. Then open your hand wide. Repeat this motion with your thumb and each finger on your hand. Repeat this exercise 5-10 times with each hand. Thumb motion  Sit with your forearm resting on a table and your wrist straight. Your thumb should be facing up toward the ceiling. Keep your fingers relaxed as you move your thumb. Lift your thumb up as high as you can toward the ceiling. Hold for __________ seconds. Bend your thumb across your palm as far as you can, reaching the tip of your thumb for the small finger (pinkie) side of your palm. Hold for __________ seconds. Repeat this exercise 5-10 times with each hand. Grip strengthening  Hold a stress ball or other soft ball in the middle of your hand. Slowly increase the pressure, squeezing the ball as much as you can without causing pain. Think of bringing the tips of your fingers into the middle of your palm. All of your finger joints should bend when doing this exercise. Hold your squeeze for __________ seconds, then relax. Repeat this exercise 5-10 times with each hand. Contact a health care provider if: Your hand pain or discomfort gets much worse when you do an exercise. Your hand pain or discomfort does not  improve within 2 hours after you exercise. If you have any of these problems, stop doing these exercises right away. Do not do them again unless your health care provider says that you can. Get help right away if: You develop sudden, severe hand pain or swelling. If this happens, stop doing these exercises right away. Do not do them again unless your health care provider  says that you can. This information is not intended to replace advice given to you by your health care provider. Make sure you discuss any questions you have with your health care provider. Document Revised: 03/31/2021 Document Reviewed: 03/31/2021 Else[employer] Patient Education  [place]. Shoulder Exercises Ask your health care provider which exercises are safe for you. Do exercises exactly as told by your health care provider and adjust them as directed. It is normal to feel mild stretching, pulling, tightness, or discomfort as you do these exercises. Stop right away if you feel sudden pain or your pain gets worse. Do not begin these exercises until told by your health care provider. Stretching exercises External rotation and abduction This exercise is sometimes called corner stretch. This exercise rotates your arm outward (external rotation) and moves your arm out from your body (abduction). Stand in a doorway with one of your feet slightly in front of the other. This is called a staggered stance. If you cannot reach your forearms to the door frame, stand facing a corner of a room. Choose one of the following positions as told by your health care provider: Place your hands and forearms on the door frame above your head. Place your hands and forearms on the door frame at the height of your head. Place your hands on the door frame at the height of your elbows. Slowly move your weight onto your front foot until you feel a stretch across your chest and in the front of your shoulders. Keep your head and chest upright and keep  your abdominal muscles tight. Hold for __________ seconds. To release the stretch, shift your weight to your back foot. Repeat __________ times. Complete this exercise __________ times a day. Extension, standing Stand and hold a broomstick, a cane, or a similar object behind your back. Your hands should be a little wider than shoulder width apart. Your palms should face away from your back. Keeping your elbows straight and your shoulder muscles relaxed, move the stick away from your body until you feel a stretch in your shoulders (extension). Avoid shrugging your shoulders while you move the stick. Keep your shoulder blades tucked down toward the middle of your back. Hold for __________ seconds. Slowly return to the starting position. Repeat __________ times. Complete this exercise __________ times a day. Range-of-motion exercises Pendulum  Stand near a wall or a surface that you can hold onto for balance. Bend at the waist and let your left / right arm hang straight down. Use your other arm to support you. Keep your back straight and do not lock your knees. Relax your left / right arm and shoulder muscles, and move your hips and your trunk so your left / right arm swings freely. Your arm should swing because of the motion of your body, not because you are using your arm or shoulder muscles. Keep moving your hips and trunk so your arm swings in the following directions, as told by your health care provider: Side to side. Forward and backward. In clockwise and counterclockwise circles. Continue each motion for __________ seconds, or for as long as told by your health care provider. Slowly return to the starting position. Repeat __________ times. Complete this exercise __________ times a day. Shoulder flexion, standing  Stand and hold a broomstick, a cane, or a similar object. Place your hands a little more than shoulder width apart on the object. Your left / right hand should be palm up,  and your other hand should  be palm down. Keep your elbow straight and your shoulder muscles relaxed. Push the stick up with your healthy arm to raise your left / right arm in front of your body, and then over your head until you feel a stretch in your shoulder (flexion). Avoid shrugging your shoulder while you raise your arm. Keep your shoulder blade tucked down toward the middle of your back. Hold for __________ seconds. Slowly return to the starting position. Repeat __________ times. Complete this exercise __________ times a day. Shoulder abduction, standing Stand and hold a broomstick, a cane, or a similar object. Place your hands a little more than shoulder width apart on the object. Your left / right hand should be palm up, and your other hand should be palm down. Keep your elbow straight and your shoulder muscles relaxed. Push the object across your body toward your left / right side. Raise your left / right arm to the side of your body (abduction) until you feel a stretch in your shoulder. Do not raise your arm above shoulder height unless your health care provider tells you to do that. If directed, raise your arm over your head. Avoid shrugging your shoulder while you raise your arm. Keep your shoulder blade tucked down toward the middle of your back. Hold for __________ seconds. Slowly return to the starting position. Repeat __________ times. Complete this exercise __________ times a day. Internal rotation  Place your left / right hand behind your back, palm up. Use your other hand to dangle an exercise band, a towel, or a similar object over your shoulder. Grasp the band with your left / right hand so you are holding on to both ends. Gently pull up on the band until you feel a stretch in the front of your left / right shoulder. The movement of your arm toward the center of your body is called internal rotation. Avoid shrugging your shoulder while you raise your arm. Keep your shoulder  blade tucked down toward the middle of your back. Hold for __________ seconds. Release the stretch by letting go of the band and lowering your hands. Repeat __________ times. Complete this exercise __________ times a day. Strengthening exercises External rotation  Sit in a stable chair without armrests. Secure an exercise band to a stable object at elbow height on your left / right side. Place a soft object, such as a folded towel or a small pillow, between your left / right upper arm and your body to move your elbow about 4 inches (10 cm) away from your side. Hold the end of the exercise band so it is tight and there is no slack. Keeping your elbow pressed against the soft object, slowly move your forearm out, away from your abdomen (external rotation). Keep your body steady so only your forearm moves. Hold for __________ seconds. Slowly return to the starting position. Repeat __________ times. Complete this exercise __________ times a day. Shoulder abduction  Sit in a stable chair without armrests, or stand up. Hold a __________ weight in your left / right hand, or hold an exercise band with both hands. Start with your arms straight down and your left / right palm facing in, toward your body. Slowly lift your left / right hand out to your side (abduction). Do not lift your hand above shoulder height unless your health care provider tells you that this is safe. Keep your arms straight. Avoid shrugging your shoulder while you do this movement. Keep your shoulder blade tucked down  toward the middle of your back. Hold for __________ seconds. Slowly lower your arm, and return to the starting position. Repeat __________ times. Complete this exercise __________ times a day. Shoulder extension Sit in a stable chair without armrests, or stand up. Secure an exercise band to a stable object in front of you so it is at shoulder height. Hold one end of the exercise band in each hand. Your palms  should face each other. Straighten your elbows and lift your hands up to shoulder height. Step back, away from the secured end of the exercise band, until the band is tight and there is no slack. Squeeze your shoulder blades together as you pull your hands down to the sides of your thighs (extension). Stop when your hands are straight down by your sides. Do not let your hands go behind your body. Hold for __________ seconds. Slowly return to the starting position. Repeat __________ times. Complete this exercise __________ times a day. Shoulder row Sit in a stable chair without armrests, or stand up. Secure an exercise band to a stable object in front of you so it is at waist height. Hold one end of the exercise band in each hand. Position your palms so that your thumbs are facing the ceiling (neutral position). Bend each of your elbows to a 90-degree angle (right angle) and keep your upper arms at your sides. Step back until the band is tight and there is no slack. Slowly pull your elbows back behind you. Hold for __________ seconds. Slowly return to the starting position. Repeat __________ times. Complete this exercise __________ times a day. Shoulder press-ups  Sit in a stable chair that has armrests. Sit upright, with your feet flat on the floor. Put your hands on the armrests so your elbows are bent and your fingers are pointing forward. Your hands should be about even with the sides of your body. Push down on the armrests and use your arms to lift yourself off the chair. Straighten your elbows and lift yourself up as much as you comfortably can. Move your shoulder blades down, and avoid letting your shoulders move up toward your ears. Keep your feet on the ground. As you get stronger, your feet should support less of your body weight as you lift yourself up. Hold for __________ seconds. Slowly lower yourself back into the chair. Repeat __________ times. Complete this exercise  __________ times a day. Wall push-ups  Stand so you are facing a stable wall. Your feet should be about one arm-length away from the wall. Lean forward and place your palms on the wall at shoulder height. Keep your feet flat on the floor as you bend your elbows and lean forward toward the wall. Hold for __________ seconds. Straighten your elbows to push yourself back to the starting position. Repeat __________ times. Complete this exercise __________ times a day. This information is not intended to replace advice given to you by your health care provider. Make sure you discuss any questions you have with your health care provider. Document Revised: 04/04/2019 Document Reviewed: 01/10/2019 Else[employer] Patient Education  [place].

## 2021-11-23 DIAGNOSIS — R7301 Impaired fasting glucose: Secondary | ICD-10-CM | POA: Diagnosis not present

## 2021-11-23 DIAGNOSIS — M797 Fibromyalgia: Secondary | ICD-10-CM | POA: Diagnosis not present

## 2021-11-23 DIAGNOSIS — I110 Hypertensive heart disease with heart failure: Secondary | ICD-10-CM | POA: Diagnosis not present

## 2021-11-23 DIAGNOSIS — I5042 Chronic combined systolic (congestive) and diastolic (congestive) heart failure: Secondary | ICD-10-CM | POA: Diagnosis not present

## 2021-11-23 NOTE — Progress Notes (Signed)
CBC and CMP WNL. ESR WNL.  CRP is slightly elevated-14.  Plaquenil blood level is still pending.  Please see if the patients shoulder pain is improving after the cortisone injection yesterday.

## 2021-11-23 NOTE — Telephone Encounter (Signed)
Submitted for VOB 11/23/2021.

## 2021-11-24 NOTE — Telephone Encounter (Signed)
Please call patient to schedule Visco knee injections.  Authorized for Synvisc series bilateral knees. Buy and Bill. Deductible does not apply. No PA required. Insurance to cover 100% of allowable cost for administration with a $30.00 copay, and 80% of the allowable cost for medication.

## 2021-11-24 NOTE — Telephone Encounter (Signed)
I tried contacting patient in regards to scheduling knee injections. Patient did not answer, and voicemail is full. I was unable to leave a message.

## 2021-11-25 NOTE — Telephone Encounter (Signed)
I tried contacting patient to schedule Visco. Patient did not answer/ voicemail is not set up. I could not leave a message.

## 2021-12-01 LAB — CBC WITH DIFFERENTIAL/PLATELET
Absolute Monocytes: 506 cells/uL (ref 200–950)
Basophils Absolute: 33 cells/uL (ref 0–200)
Basophils Relative: 0.4 %
Eosinophils Absolute: 66 cells/uL (ref 15–500)
Eosinophils Relative: 0.8 %
HCT: 39.8 % (ref 35.0–45.0)
Hemoglobin: 13.1 g/dL (ref 11.7–15.5)
Lymphs Abs: 2058 cells/uL (ref 850–3900)
MCH: 28.7 pg (ref 27.0–33.0)
MCHC: 32.9 g/dL (ref 32.0–36.0)
MCV: 87.3 fL (ref 80.0–100.0)
MPV: 9.3 fL (ref 7.5–12.5)
Monocytes Relative: 6.1 %
Neutro Abs: 5636 cells/uL (ref 1500–7800)
Neutrophils Relative %: 67.9 %
Platelets: 274 10*3/uL (ref 140–400)
RBC: 4.56 10*6/uL (ref 3.80–5.10)
RDW: 12.4 % (ref 11.0–15.0)
Total Lymphocyte: 24.8 %
WBC: 8.3 10*3/uL (ref 3.8–10.8)

## 2021-12-01 LAB — COMPLETE METABOLIC PANEL WITH GFR
AG Ratio: 1.4 (calc) (ref 1.0–2.5)
ALT: 8 U/L (ref 6–29)
AST: 13 U/L (ref 10–35)
Albumin: 4 g/dL (ref 3.6–5.1)
Alkaline phosphatase (APISO): 96 U/L (ref 37–153)
BUN: 10 mg/dL (ref 7–25)
CO2: 27 mmol/L (ref 20–32)
Calcium: 8.9 mg/dL (ref 8.6–10.4)
Chloride: 105 mmol/L (ref 98–110)
Creat: 0.63 mg/dL (ref 0.50–1.05)
Globulin: 2.8 g/dL (calc) (ref 1.9–3.7)
Glucose, Bld: 78 mg/dL (ref 65–99)
Potassium: 4.1 mmol/L (ref 3.5–5.3)
Sodium: 141 mmol/L (ref 135–146)
Total Bilirubin: 0.9 mg/dL (ref 0.2–1.2)
Total Protein: 6.8 g/dL (ref 6.1–8.1)
eGFR: 100 mL/min/{1.73_m2} (ref >=60)

## 2021-12-01 LAB — HYDROXYCHLOROQUINE,BLOOD: HYDROXYCHLOROQUINE, (B): 540 ng/mL — ABNORMAL HIGH

## 2021-12-01 LAB — SEDIMENTATION RATE: Sed Rate: 28 mm/h (ref 0–30)

## 2021-12-01 LAB — C-REACTIVE PROTEIN: CRP: 14 mg/L — ABNORMAL HIGH (ref <8.0)

## 2021-12-01 NOTE — Progress Notes (Signed)
I would recommend increasing the dose of Plaquenil to 200 mg 1 tablet twice daily.  Recheck plaquenil blood level in 3 months.

## 2021-12-01 NOTE — Progress Notes (Signed)
Plaquenil blood level is on the lower end of normal.  Please clarify how she has been taking plaquenil.

## 2021-12-02 ENCOUNTER — Telehealth: Payer: Self-pay | Admitting: *Deleted

## 2021-12-02 DIAGNOSIS — M0579 Rheumatoid arthritis with rheumatoid factor of multiple sites without organ or systems involvement: Secondary | ICD-10-CM

## 2021-12-02 DIAGNOSIS — Z79899 Other long term (current) drug therapy: Secondary | ICD-10-CM

## 2021-12-02 NOTE — Telephone Encounter (Signed)
Attempted to contact patient to schedule Visco knee injections.  Voicemail full / unable to leave a message.

## 2021-12-02 NOTE — Telephone Encounter (Signed)
-----   Message from [name], PA-C sent at 12/01/2021 12:16 PM EST ----- I would recommend increasing the dose of Plaquenil to 200 mg 1 tablet twice daily.  Recheck plaquenil blood level in 3 months.

## 2021-12-21 ENCOUNTER — Other Ambulatory Visit: Payer: Self-pay | Admitting: Rheumatology

## 2021-12-21 MED ORDER — HYDROXYCHLOROQUINE SULFATE 200 MG PO TABS: ORAL_TABLET | ORAL | 0 refills | Status: DC

## 2021-12-21 NOTE — Telephone Encounter (Signed)
Next Visit: 01/24/2022  Last Visit: 11/22/2021  Labs: 11/22/2021 CBC and CMP WNL  Eye exam: 08/26/2020 WNL [facility] follow up 1 year. Her PLQ eye exam is scheduled for 01/17/22   Current Dose per lab note 11/22/2021: recommend increasing the dose of Plaquenil to 200 mg 1 tablet twice daily  QK:[id number] arthritis with rheumatoid factor of multiple sites without organ or systems involvement  Last Fill: 08/05/2021  Okay to refill Plaquenil?

## 2021-12-21 NOTE — Telephone Encounter (Signed)
Patient called the office requesting a refill of Hydroxychlorquine 200 mg to be sent to [facility] on [address] in [place].

## 2021-12-23 DIAGNOSIS — I110 Hypertensive heart disease with heart failure: Secondary | ICD-10-CM | POA: Diagnosis not present

## 2021-12-23 DIAGNOSIS — M797 Fibromyalgia: Secondary | ICD-10-CM | POA: Diagnosis not present

## 2021-12-23 DIAGNOSIS — I5042 Chronic combined systolic (congestive) and diastolic (congestive) heart failure: Secondary | ICD-10-CM | POA: Diagnosis not present

## 2021-12-23 DIAGNOSIS — R7301 Impaired fasting glucose: Secondary | ICD-10-CM | POA: Diagnosis not present

## 2021-12-27 ENCOUNTER — Telehealth: Payer: Self-pay

## 2021-12-27 DIAGNOSIS — G8929 Other chronic pain: Secondary | ICD-10-CM

## 2021-12-27 NOTE — Telephone Encounter (Signed)
Patient left a voicemail checking the status of her referral for an MRI of shoulder.  Patient states she is in "excruciating pain."

## 2021-12-27 NOTE — Telephone Encounter (Signed)
A MRI will not be covered unless the patient has tried home exercises or failed PT.  Please clarify.

## 2021-12-27 NOTE — Telephone Encounter (Signed)
Per last office note on 11/22/2021: Chronic right shoulder pain -She presents today with right shoulder pain which started about 2 months ago after a fall. She did not hear a pop or crack during the fall. She was not evaluated initially after the fall.  Her discomfort has progressively been worsening and she has noticed more limited range of motion of her right arm.  She is also experiencing nocturnal pain on a nightly basis.  She has tried taking ibuprofen with minimal relief.  X-rays of the right shoulder were updated today which were consistent with [facility] joint arthritis.  Different treatment options were discussed today.  After informed consent the right glenohumeral joint was injected with cortisone.  Procedure note was completed above.  Aftercare was discussed.  She was advised to notify Caroline Mason if her discomfort persists or worsens.  She was given a handout of shoulder exercises to perform once her symptoms have resolved to start to improve her range of motion and strengthening.    I do not see any mention of an MRI. Do you want Caroline Mason to place an order?

## 2021-12-28 DIAGNOSIS — K219 Gastro-esophageal reflux disease without esophagitis: Secondary | ICD-10-CM | POA: Diagnosis not present

## 2021-12-28 DIAGNOSIS — I5022 Chronic systolic (congestive) heart failure: Secondary | ICD-10-CM | POA: Diagnosis not present

## 2021-12-28 DIAGNOSIS — I10 Essential (primary) hypertension: Secondary | ICD-10-CM | POA: Diagnosis not present

## 2021-12-28 DIAGNOSIS — E559 Vitamin D deficiency, unspecified: Secondary | ICD-10-CM | POA: Diagnosis not present

## 2021-12-28 DIAGNOSIS — E7849 Other hyperlipidemia: Secondary | ICD-10-CM | POA: Diagnosis not present

## 2021-12-28 DIAGNOSIS — Z1329 Encounter for screening for other suspected endocrine disorder: Secondary | ICD-10-CM | POA: Diagnosis not present

## 2021-12-28 DIAGNOSIS — E782 Mixed hyperlipidemia: Secondary | ICD-10-CM | POA: Diagnosis not present

## 2021-12-28 DIAGNOSIS — Z0001 Encounter for general adult medical examination with abnormal findings: Secondary | ICD-10-CM | POA: Diagnosis not present

## 2021-12-28 DIAGNOSIS — R7301 Impaired fasting glucose: Secondary | ICD-10-CM | POA: Diagnosis not present

## 2021-12-28 NOTE — Telephone Encounter (Signed)
Attempted to contact the patient and unable to leave a message, voicemail is full.  

## 2021-12-28 NOTE — Telephone Encounter (Signed)
Patient states after the injection wore off. Patient states she is now in excruciating pain and it interferes with activities. Patient states she has not done home exercises due to the pain and has not had physical therapy. Please advise.

## 2021-12-28 NOTE — Telephone Encounter (Signed)
Ok to try to apply for MRI for further evaluation.

## 2021-12-28 NOTE — Telephone Encounter (Signed)
Patient left a voicemail on my phone returning your call. Please call patient.

## 2021-12-29 DIAGNOSIS — I5042 Chronic combined systolic (congestive) and diastolic (congestive) heart failure: Secondary | ICD-10-CM | POA: Diagnosis not present

## 2021-12-29 DIAGNOSIS — M797 Fibromyalgia: Secondary | ICD-10-CM | POA: Diagnosis not present

## 2021-12-29 DIAGNOSIS — I428 Other cardiomyopathies: Secondary | ICD-10-CM | POA: Diagnosis not present

## 2021-12-29 DIAGNOSIS — M0580 Other rheumatoid arthritis with rheumatoid factor of unspecified site: Secondary | ICD-10-CM | POA: Diagnosis not present

## 2021-12-29 DIAGNOSIS — Z0001 Encounter for general adult medical examination with abnormal findings: Secondary | ICD-10-CM | POA: Diagnosis not present

## 2021-12-29 DIAGNOSIS — Z23 Encounter for immunization: Secondary | ICD-10-CM | POA: Diagnosis not present

## 2021-12-29 DIAGNOSIS — F17210 Nicotine dependence, cigarettes, uncomplicated: Secondary | ICD-10-CM | POA: Diagnosis not present

## 2021-12-29 DIAGNOSIS — I10 Essential (primary) hypertension: Secondary | ICD-10-CM | POA: Diagnosis not present

## 2021-12-29 NOTE — Telephone Encounter (Signed)
Referral placed and patient advised.  

## 2022-01-09 ENCOUNTER — Other Ambulatory Visit: Payer: Self-pay | Admitting: Cardiology

## 2022-01-09 ENCOUNTER — Telehealth: Payer: Self-pay

## 2022-01-09 MED ORDER — CARVEDILOL 3.125 MG PO TABS: 3.1250 mg | ORAL_TABLET | Freq: Two times a day (BID) | ORAL | 1 refills | Status: DC

## 2022-01-09 NOTE — Telephone Encounter (Signed)
Medication refill request for Coreg 3.125 mg tablets BID approved and sent to pharmacy

## 2022-01-11 NOTE — Progress Notes (Deleted)
Office Visit Note  Patient: Caroline Mason             Date of Birth: 1956/07/08           MRN: [id number]             PCP: Curlene Labrum, MD Referring: Curlene Labrum, MD Visit Date: 01/24/2022 Occupation: @GUAROCC @  Subjective:  No chief complaint on file.   History of Present Illness: Caroline Mason is a 66 y.o. female ***   Activities of Daily Living:  Patient reports morning stiffness for *** {minute/hour:19697}.   Patient {ACTIONS;DENIES/REPORTS:21021675::"Denies"} nocturnal pain.  Difficulty dressing/grooming: {ACTIONS;DENIES/REPORTS:21021675::"Denies"} Difficulty climbing stairs: {ACTIONS;DENIES/REPORTS:21021675::"Denies"} Difficulty getting out of chair: {ACTIONS;DENIES/REPORTS:21021675::"Denies"} Difficulty using hands for taps, buttons, cutlery, and/or writing: {ACTIONS;DENIES/REPORTS:21021675::"Denies"}  No Rheumatology ROS completed.   PMFS History:  Patient Active Problem List   Diagnosis Date Noted   Ganglion cyst of wrist, right 08/14/2018   Trigger thumb, right thumb 03/27/2018   High risk medication use 12/01/2016   Rheumatoid arthritis with rheumatoid factor of multiple sites without organ or systems involvement ([place]) 12/01/2016   Fibromyalgia 12/01/2016   Primary osteoarthritis of both hands 12/01/2016   Primary osteoarthritis of both knees 12/01/2016   Primary osteoarthritis of both feet 12/01/2016   Bradycardia 34/74/2595   Chronic systolic heart failure ([place]) 06/05/2011   ANEMIA 03/01/2011   TOBACCO ABUSE 01/03/2011   OBSTRUCTIVE SLEEP APNEA 01/03/2011   ESSENTIAL HYPERTENSION, BENIGN 01/03/2011   Secondary cardiomyopathy ([place]) 01/03/2011   COPD 01/03/2011    Past Medical History:  Diagnosis Date   Atypical pneumonia    12/11   Cardiomyopathy    Nonischemic, normal coronaries, LVEF 40-45%   COPD (chronic obstructive pulmonary disease) (HCC)    Depression    Essential hypertension    Fibromyalgia    Morbid obesity (HCC)     Obstructive sleep apnea    Rheumatoid arthritis(714.0)     Family History  Problem Relation Age of Onset   Stroke Mother    Hypertension Mother    Heart failure Sister 48   Breast cancer Sister    Heart failure Sister 6   Cancer Sister    Ovarian cancer Sister    Diabetes Maternal Grandmother    Diabetes Paternal Grandfather    Past Surgical History:  Procedure Laterality Date   CYST REMOVAL [place]   WRIST SURGERY  2016   Social History   Social History Narrative   Lives in [place].   Immunization History  Administered Date(s) Administered   Influenza-Unspecified 09/24/2014, 10/25/2018   Moderna Sars-Covid-2 Vaccination 02/09/2020, 03/11/2020, 11/30/2020     Objective: Vital Signs: There were no vitals taken for this visit.   Physical Exam   Musculoskeletal Exam: ***  CDAI Exam: CDAI Score: -- Patient Global: --; Provider Global: -- Swollen: --; Tender: -- Joint Exam 01/24/2022   No joint exam has been documented for this visit   There is currently no information documented on the homunculus. Go to the Rheumatology activity and complete the homunculus joint exam.  Investigation: No additional findings.  Imaging: No results found.  Recent Labs: Lab Results  Component Value Date   WBC 8.3 11/22/2021   HGB 13.1 11/22/2021   PLT 274 11/22/2021   NA 141 11/22/2021   K 4.1 11/22/2021   CL 105 11/22/2021   CO2 27 11/22/2021   GLUCOSE 78 11/22/2021   BUN 10 11/22/2021   CREATININE 0.63 11/22/2021  BILITOT 0.9 11/22/2021   ALKPHOS 102 12/05/2016   AST 13 11/22/2021   ALT 8 11/22/2021   PROT 6.8 11/22/2021   ALBUMIN 4.0 12/05/2016   CALCIUM 8.9 11/22/2021   GFRAA 108 09/29/2020    Speciality Comments: PLQ Eye Exam: 08/26/2020 WNL [facility] follow up 1 year  Procedures:  No procedures performed Allergies: Aspirin   Assessment / Plan:     Visit Diagnoses: No diagnosis found.  Orders: No orders of the  defined types were placed in this encounter.  No orders of the defined types were placed in this encounter.   Face-to-face time spent with patient was *** minutes. Greater than 50% of time was spent in counseling and coordination of care.  Follow-Up Instructions: No follow-ups on file.   Earnestine Mealing, CMA  Note - This record has been created using Editor, commissioning.  Chart creation errors have been sought, but may not always  have been located. Such creation errors do not reflect on  the standard of medical care.

## 2022-01-17 DIAGNOSIS — H10413 Chronic giant papillary conjunctivitis, bilateral: Secondary | ICD-10-CM | POA: Diagnosis not present

## 2022-01-17 DIAGNOSIS — H2513 Age-related nuclear cataract, bilateral: Secondary | ICD-10-CM | POA: Diagnosis not present

## 2022-01-17 DIAGNOSIS — Z79899 Other long term (current) drug therapy: Secondary | ICD-10-CM | POA: Diagnosis not present

## 2022-01-17 DIAGNOSIS — H04123 Dry eye syndrome of bilateral lacrimal glands: Secondary | ICD-10-CM | POA: Diagnosis not present

## 2022-01-17 DIAGNOSIS — M069 Rheumatoid arthritis, unspecified: Secondary | ICD-10-CM | POA: Diagnosis not present

## 2022-01-18 DIAGNOSIS — Z1211 Encounter for screening for malignant neoplasm of colon: Secondary | ICD-10-CM | POA: Diagnosis not present

## 2022-01-22 DIAGNOSIS — I110 Hypertensive heart disease with heart failure: Secondary | ICD-10-CM | POA: Diagnosis not present

## 2022-01-22 DIAGNOSIS — I5042 Chronic combined systolic (congestive) and diastolic (congestive) heart failure: Secondary | ICD-10-CM | POA: Diagnosis not present

## 2022-01-24 ENCOUNTER — Ambulatory Visit: Payer: Medicare Other | Admitting: Rheumatology

## 2022-01-24 DIAGNOSIS — G8929 Other chronic pain: Secondary | ICD-10-CM

## 2022-01-24 DIAGNOSIS — M0579 Rheumatoid arthritis with rheumatoid factor of multiple sites without organ or systems involvement: Secondary | ICD-10-CM

## 2022-01-24 DIAGNOSIS — M19042 Primary osteoarthritis, left hand: Secondary | ICD-10-CM

## 2022-01-24 DIAGNOSIS — M19071 Primary osteoarthritis, right ankle and foot: Secondary | ICD-10-CM

## 2022-01-24 DIAGNOSIS — Z8669 Personal history of other diseases of the nervous system and sense organs: Secondary | ICD-10-CM

## 2022-01-24 DIAGNOSIS — Z79899 Other long term (current) drug therapy: Secondary | ICD-10-CM

## 2022-01-24 DIAGNOSIS — Z8709 Personal history of other diseases of the respiratory system: Secondary | ICD-10-CM

## 2022-01-24 DIAGNOSIS — Z862 Personal history of diseases of the blood and blood-forming organs and certain disorders involving the immune mechanism: Secondary | ICD-10-CM

## 2022-01-24 DIAGNOSIS — M170 Bilateral primary osteoarthritis of knee: Secondary | ICD-10-CM

## 2022-01-24 DIAGNOSIS — Z8639 Personal history of other endocrine, nutritional and metabolic disease: Secondary | ICD-10-CM

## 2022-01-24 DIAGNOSIS — M797 Fibromyalgia: Secondary | ICD-10-CM

## 2022-02-03 NOTE — Progress Notes (Unsigned)
Office Visit Note  Patient: Caroline Mason             Date of Birth: 03/28/1956           MRN: [id number]             PCP: Curlene Labrum, MD Referring: Curlene Labrum, MD Visit Date: 02/16/2022 Occupation: @GUAROCC @  Subjective:  No chief complaint on file.   History of Present Illness: Caroline Mason is a 66 y.o. female ***   Activities of Daily Living:  Patient reports morning stiffness for *** {minute/hour:19697}.   Patient {ACTIONS;DENIES/REPORTS:21021675::"Denies"} nocturnal pain.  Difficulty dressing/grooming: {ACTIONS;DENIES/REPORTS:21021675::"Denies"} Difficulty climbing stairs: {ACTIONS;DENIES/REPORTS:21021675::"Denies"} Difficulty getting out of chair: {ACTIONS;DENIES/REPORTS:21021675::"Denies"} Difficulty using hands for taps, buttons, cutlery, and/or writing: {ACTIONS;DENIES/REPORTS:21021675::"Denies"}  No Rheumatology ROS completed.   PMFS History:  Patient Active Problem List   Diagnosis Date Noted   Ganglion cyst of wrist, right 08/14/2018   Trigger thumb, right thumb 03/27/2018   High risk medication use 12/01/2016   Rheumatoid arthritis with rheumatoid factor of multiple sites without organ or systems involvement ([place]) 12/01/2016   Fibromyalgia 12/01/2016   Primary osteoarthritis of both hands 12/01/2016   Primary osteoarthritis of both knees 12/01/2016   Primary osteoarthritis of both feet 12/01/2016   Bradycardia 56/31/4970   Chronic systolic heart failure ([place]) 06/05/2011   ANEMIA 03/01/2011   TOBACCO ABUSE 01/03/2011   OBSTRUCTIVE SLEEP APNEA 01/03/2011   ESSENTIAL HYPERTENSION, BENIGN 01/03/2011   Secondary cardiomyopathy ([place]) 01/03/2011   COPD 01/03/2011    Past Medical History:  Diagnosis Date   Atypical pneumonia    12/11   Cardiomyopathy    Nonischemic, normal coronaries, LVEF 40-45%   COPD (chronic obstructive pulmonary disease) (HCC)    Depression    Essential hypertension    Fibromyalgia    Morbid obesity (HCC)     Obstructive sleep apnea    Rheumatoid arthritis(714.0)     Family History  Problem Relation Age of Onset   Stroke Mother    Hypertension Mother    Heart failure Sister 57   Breast cancer Sister    Heart failure Sister 79   Cancer Sister    Ovarian cancer Sister    Diabetes Maternal Grandmother    Diabetes Paternal Grandfather    Past Surgical History:  Procedure Laterality Date   CYST REMOVAL [place]   WRIST SURGERY  2016   Social History   Social History Narrative   Lives in [place].   Immunization History  Administered Date(s) Administered   Influenza-Unspecified 09/24/2014, 10/25/2018   Moderna Sars-Covid-2 Vaccination 02/09/2020, 03/11/2020, 11/30/2020     Objective: Vital Signs: There were no vitals taken for this visit.   Physical Exam   Musculoskeletal Exam: ***  CDAI Exam: CDAI Score: -- Patient Global: --; Provider Global: -- Swollen: --; Tender: -- Joint Exam 02/16/2022   No joint exam has been documented for this visit   There is currently no information documented on the homunculus. Go to the Rheumatology activity and complete the homunculus joint exam.  Investigation: No additional findings.  Imaging: No results found.  Recent Labs: Lab Results  Component Value Date   WBC 8.3 11/22/2021   HGB 13.1 11/22/2021   PLT 274 11/22/2021   NA 141 11/22/2021   K 4.1 11/22/2021   CL 105 11/22/2021   CO2 27 11/22/2021   GLUCOSE 78 11/22/2021   BUN 10 11/22/2021   CREATININE 0.63 11/22/2021  BILITOT 0.9 11/22/2021   ALKPHOS 102 12/05/2016   AST 13 11/22/2021   ALT 8 11/22/2021   PROT 6.8 11/22/2021   ALBUMIN 4.0 12/05/2016   CALCIUM 8.9 11/22/2021   GFRAA 108 09/29/2020    Speciality Comments: PLQ Eye Exam:01/17/2022 WNL [facility] follow up 1 year  Procedures:  No procedures performed Allergies: Aspirin   Assessment / Plan:     Visit Diagnoses: No diagnosis found.  Orders: No orders of the  defined types were placed in this encounter.  No orders of the defined types were placed in this encounter.   Face-to-face time spent with patient was *** minutes. Greater than 50% of time was spent in counseling and coordination of care.  Follow-Up Instructions: No follow-ups on file.   Earnestine Mealing, CMA  Note - This record has been created using Editor, commissioning.  Chart creation errors have been sought, but may not always  have been located. Such creation errors do not reflect on  the standard of medical care.

## 2022-02-06 ENCOUNTER — Other Ambulatory Visit: Payer: Self-pay

## 2022-02-06 ENCOUNTER — Ambulatory Visit (HOSPITAL_COMMUNITY)
Admission: RE | Admit: 2022-02-06 | Discharge: 2022-02-06 | Disposition: A | Discharge status: Home / Self Care | Payer: Medicare Other | Source: Ambulatory Visit | Attending: Physician Assistant | Admitting: Physician Assistant

## 2022-02-06 DIAGNOSIS — G8929 Other chronic pain: Secondary | ICD-10-CM | POA: Insufficient documentation

## 2022-02-06 DIAGNOSIS — M25511 Pain in right shoulder: Secondary | ICD-10-CM | POA: Diagnosis not present

## 2022-02-07 NOTE — Progress Notes (Signed)
She has severe tendinosis of the supraspinatus tendon with large full thickness, near complete tear with 14 mm of retraction.  She has severe tendinosis involving multiple tendons.    She will need any evaluation by an orthopedist.  Please ask the patient where she would like to be seen and we can place the referral.

## 2022-02-15 ENCOUNTER — Ambulatory Visit (INDEPENDENT_AMBULATORY_CARE_PROVIDER_SITE_OTHER): Payer: Medicare Other | Admitting: Orthopaedic Surgery

## 2022-02-15 ENCOUNTER — Encounter: Payer: Self-pay | Admitting: Orthopaedic Surgery

## 2022-02-15 ENCOUNTER — Other Ambulatory Visit: Payer: Self-pay

## 2022-02-15 ENCOUNTER — Telehealth: Payer: Self-pay | Admitting: Cardiology

## 2022-02-15 VITALS — Ht 65.0 in | Wt 241.0 lb

## 2022-02-15 DIAGNOSIS — M25511 Pain in right shoulder: Secondary | ICD-10-CM | POA: Diagnosis not present

## 2022-02-15 DIAGNOSIS — M75121 Complete rotator cuff tear or rupture of right shoulder, not specified as traumatic: Secondary | ICD-10-CM | POA: Diagnosis not present

## 2022-02-15 DIAGNOSIS — M75101 Unspecified rotator cuff tear or rupture of right shoulder, not specified as traumatic: Secondary | ICD-10-CM | POA: Insufficient documentation

## 2022-02-15 NOTE — Telephone Encounter (Signed)
° °  Pre-operative Risk Assessment    Patient Name: ZIYANA MORIKAWA  DOB: [birth year] MRN: [id number]      Request for Surgical Clearance    Procedure:   Rt rotator cuff tear repair  Date of Surgery:  Clearance TBD                               Surgeon:  Dr. Liliane Channel  Surgeon's Group or Practice Name:  [facility] Phone number:  [phone] Fax number:  [phone]   Type of Clearance Requested:   - Medical    Type of Anesthesia:  General    Additional requests/questions:  Please fax a copy of the clearance, cardiac & pulmonary studies, sleep study, stress test/ cardiac cath study, recent EKG's, Chest Xray report, recent office notes (if available) to the surgeon's office.  Signed, Desma Paganini   02/15/2022, 3:18 PM

## 2022-02-15 NOTE — Telephone Encounter (Signed)
° °  Name: Caroline Mason  DOB: 10-May-1956  MRN: [id number]   Primary Cardiologist: Rozann Lesches, MD  Chart reviewed as part of pre-operative protocol coverage. Patient was contacted 02/15/2022 in reference to pre-operative risk assessment for pending surgery as outlined below.  Caroline Mason was last seen 07/2021 by Dr .Domenic Polite primarily followed for hx of NICM, recommended to f/u in 6 months. Hx of normal coronaries 2012. After last OV, pt had repeat echo 08/2021 showing EF 45-50% which was felt to be improvement from prior (40% in 2019). RCRI 0.9% indicating overall low CV risk. I reached out to patient for update on how she is doing. The patient affirms she has been doing well without any new cardiac symptoms. Able to exert > 4 METS staying very active with yardwork, gardening, walking regularly without any chest pain or dyspnea. Therefore, based on ACC/AHA guidelines, the patient would be at acceptable risk for the planned procedure without further cardiovascular testing. The patient was advised that if she develops new symptoms prior to surgery to contact our office to arrange for a follow-up visit, and she verbalized understanding.   Will route this bundled recommendation with last cardiology office note, cath, EKG, and echo report to requesting provider via Epic fax function. Patient granted verbal permission to do so. Other studies requested are not pertinent to cardiology team so would recommend obtaining form PCP if needed. Please call with questions.  Charlie Pitter, PA-C 02/15/2022, 4:45 PM

## 2022-02-15 NOTE — Progress Notes (Addendum)
Office Visit Note   Patient: Caroline Mason           Date of Birth: 1956/11/23           MRN: [id number] Visit Date: 02/15/2022              Requested by: Curlene Labrum, MD [place],   [id number] PCP: Curlene Labrum, MD   Assessment & Plan: Visit Diagnoses: Right shoulder pain  Plan: Lameeka is a pleasant 66 year old right-hand-dominant woman with a 3 to 78-month history of right shoulder pain.  She does have a history of rheumatoid arthritis and has been seeing Dr. Estanislado Pandy.  She was given an injection in November this did not help at all.  Following this she had an MRI.  Medical history also significant for cardiomyopathy.  She has been on disability for over 11 years.  Also history of fibromyalgia.  She feels this is a quality-of-life issue for her.  Exam she does not have any findings of adhesive capsulitis with help she is able to fully extend her shoulder.  She is very painful however.  MRI reviewed today demonstrate large full-thickness near complete tear of the supraspinatus she also has some moderate arthropathy of the acromioclavicular joint Long discussion was had with her regards to treatment options.  Recommended a shoulder arthroscopy with a rotator cuff repair.  She does have family at home that can help her afterwards.  Reviewed the risks including bleeding infection anesthesia complications recurrent tear.  All of her questions were answered by Dr. Durward Fortes.  We did give her a cardiac clearance form which she will have filled out by her cardiologist given her history and the need for general anesthetic. I discussed, specifically, the arthroscopic subacromial decompression and distal clavicle resection and then a mini open rotator cuff tear repair.  She might need a biceps tenodesis and a possible patch.  Discussed use of the sling and postop rehab.  Surgery to be scheduled pending cardiac clearance.  Follow-Up Instructions: No follow-ups on file.    Orders:  No orders of the defined types were placed in this encounter.  No orders of the defined types were placed in this encounter.     Procedures: No procedures performed   Clinical Data: No additional findings.   Subjective: Chief Complaint  Patient presents with   Right Shoulder - Pain  Patient presents today for right shoulder pain. She said that it has been hurting for 2-4months. No known injury. She has been seeing Dr.Deveswhar and had had x-rays and an MRI. She received a cortisone injection in November. She is right hand dominant. She takes over the counter medicine as needed.     Review of Systems  All other systems reviewed and are negative.   Objective: Vital Signs: There were no vitals taken for this visit.  Physical Exam Constitutional:      Appearance: Normal appearance.  Eyes:     Extraocular Movements: Extraocular movements intact.  Pulmonary:     Effort: Pulmonary effort is normal.  Neurological:     General: No focal deficit present.     Mental Status: She is alert.  Psychiatric:        Mood and Affect: Mood normal.        Behavior: Behavior normal.    Ortho Exam [place] of the right shoulder she is right-hand dominant.  With help she can go to full forward extension.  The only reason she resists is  because of pain.  Is able to sustain her arm for brief periods of time above her head but says it is very painful.  She has pain with empty can test.  Strength is fair more limited by pain.  Positive impingement testing on the extreme of internal/external rotation and positive empty can.  Good grip and release Specialty Comments:  No specialty comments available.  Imaging: No results found.   PMFS History: Patient Active Problem List   Diagnosis Date Noted   Ganglion cyst of wrist, right 08/14/2018   Trigger thumb, right thumb 03/27/2018   High risk medication use 12/01/2016   Rheumatoid arthritis with rheumatoid factor of multiple sites  without organ or systems involvement ([place]) 12/01/2016   Fibromyalgia 12/01/2016   Primary osteoarthritis of both hands 12/01/2016   Primary osteoarthritis of both knees 12/01/2016   Primary osteoarthritis of both feet 12/01/2016   Bradycardia 32/20/2542   Chronic systolic heart failure ([place]) 06/05/2011   ANEMIA 03/01/2011   TOBACCO ABUSE 01/03/2011   OBSTRUCTIVE SLEEP APNEA 01/03/2011   ESSENTIAL HYPERTENSION, BENIGN 01/03/2011   Secondary cardiomyopathy ([place]) 01/03/2011   COPD 01/03/2011   Past Medical History:  Diagnosis Date   Atypical pneumonia    12/11   Cardiomyopathy    Nonischemic, normal coronaries, LVEF 40-45%   COPD (chronic obstructive pulmonary disease) (HCC)    Depression    Essential hypertension    Fibromyalgia    Morbid obesity (HCC)    Obstructive sleep apnea    Rheumatoid arthritis(714.0)     Family History  Problem Relation Age of Onset   Stroke Mother    Hypertension Mother    Heart failure Sister 8   Breast cancer Sister    Heart failure Sister 79   Cancer Sister    Ovarian cancer Sister    Diabetes Maternal Grandmother    Diabetes Paternal Grandfather     Past Surgical History:  Procedure Laterality Date   CYST REMOVAL [place]   WRIST SURGERY  2016   Social History   Occupational History   Occupation: Psychologist, 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

## 2022-02-16 ENCOUNTER — Ambulatory Visit: Payer: Medicare Other | Admitting: Physician Assistant

## 2022-02-16 DIAGNOSIS — G8929 Other chronic pain: Secondary | ICD-10-CM

## 2022-02-16 DIAGNOSIS — M0579 Rheumatoid arthritis with rheumatoid factor of multiple sites without organ or systems involvement: Secondary | ICD-10-CM

## 2022-02-16 DIAGNOSIS — M170 Bilateral primary osteoarthritis of knee: Secondary | ICD-10-CM

## 2022-02-16 DIAGNOSIS — Z79899 Other long term (current) drug therapy: Secondary | ICD-10-CM

## 2022-02-16 DIAGNOSIS — Z8669 Personal history of other diseases of the nervous system and sense organs: Secondary | ICD-10-CM

## 2022-02-16 DIAGNOSIS — M19041 Primary osteoarthritis, right hand: Secondary | ICD-10-CM

## 2022-02-16 DIAGNOSIS — Z8639 Personal history of other endocrine, nutritional and metabolic disease: Secondary | ICD-10-CM

## 2022-02-16 DIAGNOSIS — Z8709 Personal history of other diseases of the respiratory system: Secondary | ICD-10-CM

## 2022-02-16 DIAGNOSIS — Z862 Personal history of diseases of the blood and blood-forming organs and certain disorders involving the immune mechanism: Secondary | ICD-10-CM

## 2022-02-16 DIAGNOSIS — M19071 Primary osteoarthritis, right ankle and foot: Secondary | ICD-10-CM

## 2022-02-16 DIAGNOSIS — M797 Fibromyalgia: Secondary | ICD-10-CM

## 2022-02-22 HISTORY — PX: ROTATOR CUFF REPAIR: SHX139

## 2022-02-23 DIAGNOSIS — Z79899 Other long term (current) drug therapy: Secondary | ICD-10-CM | POA: Diagnosis not present

## 2022-02-23 DIAGNOSIS — Z886 Allergy status to analgesic agent status: Secondary | ICD-10-CM | POA: Diagnosis not present

## 2022-02-23 DIAGNOSIS — M797 Fibromyalgia: Secondary | ICD-10-CM | POA: Diagnosis not present

## 2022-02-23 DIAGNOSIS — I509 Heart failure, unspecified: Secondary | ICD-10-CM | POA: Diagnosis not present

## 2022-02-23 DIAGNOSIS — Z1211 Encounter for screening for malignant neoplasm of colon: Secondary | ICD-10-CM | POA: Diagnosis not present

## 2022-02-23 DIAGNOSIS — I110 Hypertensive heart disease with heart failure: Secondary | ICD-10-CM | POA: Diagnosis not present

## 2022-02-23 DIAGNOSIS — F32A Depression, unspecified: Secondary | ICD-10-CM | POA: Diagnosis not present

## 2022-02-23 DIAGNOSIS — F17210 Nicotine dependence, cigarettes, uncomplicated: Secondary | ICD-10-CM | POA: Diagnosis not present

## 2022-02-23 DIAGNOSIS — I429 Cardiomyopathy, unspecified: Secondary | ICD-10-CM | POA: Diagnosis not present

## 2022-02-23 DIAGNOSIS — M069 Rheumatoid arthritis, unspecified: Secondary | ICD-10-CM | POA: Diagnosis not present

## 2022-03-10 ENCOUNTER — Ambulatory Visit: Payer: Medicare Other | Admitting: Cardiology

## 2022-03-10 DIAGNOSIS — Z1211 Encounter for screening for malignant neoplasm of colon: Secondary | ICD-10-CM | POA: Diagnosis not present

## 2022-03-10 DIAGNOSIS — Z1212 Encounter for screening for malignant neoplasm of rectum: Secondary | ICD-10-CM | POA: Diagnosis not present

## 2022-03-10 DIAGNOSIS — K5730 Diverticulosis of large intestine without perforation or abscess without bleeding: Secondary | ICD-10-CM | POA: Diagnosis not present

## 2022-03-16 DIAGNOSIS — M659 Synovitis and tenosynovitis, unspecified: Secondary | ICD-10-CM | POA: Diagnosis not present

## 2022-03-16 DIAGNOSIS — M94211 Chondromalacia, right shoulder: Secondary | ICD-10-CM | POA: Diagnosis not present

## 2022-03-16 DIAGNOSIS — G8918 Other acute postprocedural pain: Secondary | ICD-10-CM | POA: Diagnosis not present

## 2022-03-16 DIAGNOSIS — M75111 Incomplete rotator cuff tear or rupture of right shoulder, not specified as traumatic: Secondary | ICD-10-CM | POA: Diagnosis not present

## 2022-03-16 DIAGNOSIS — M75101 Unspecified rotator cuff tear or rupture of right shoulder, not specified as traumatic: Secondary | ICD-10-CM | POA: Diagnosis not present

## 2022-03-16 DIAGNOSIS — M7541 Impingement syndrome of right shoulder: Secondary | ICD-10-CM | POA: Diagnosis not present

## 2022-03-16 DIAGNOSIS — M75121 Complete rotator cuff tear or rupture of right shoulder, not specified as traumatic: Secondary | ICD-10-CM | POA: Diagnosis not present

## 2022-03-16 DIAGNOSIS — M7521 Bicipital tendinitis, right shoulder: Secondary | ICD-10-CM | POA: Diagnosis not present

## 2022-03-16 DIAGNOSIS — M67813 Other specified disorders of tendon, right shoulder: Secondary | ICD-10-CM | POA: Diagnosis not present

## 2022-03-16 DIAGNOSIS — M19011 Primary osteoarthritis, right shoulder: Secondary | ICD-10-CM | POA: Diagnosis not present

## 2022-03-16 MED ORDER — OXYCODONE-ACETAMINOPHEN 5-325 MG PO TABS: 1.0000 | ORAL_TABLET | ORAL | 0 refills | Status: DC | PRN

## 2022-03-20 ENCOUNTER — Telehealth: Payer: Self-pay | Admitting: Orthopaedic Surgery

## 2022-03-20 ENCOUNTER — Other Ambulatory Visit: Payer: Self-pay | Admitting: Orthopaedic Surgery

## 2022-03-20 MED ORDER — OXYCODONE-ACETAMINOPHEN 5-325 MG PO TABS: 1.0000 | ORAL_TABLET | Freq: Three times a day (TID) | ORAL | 0 refills | Status: DC | PRN

## 2022-03-20 NOTE — Telephone Encounter (Signed)
Sent in oxycodone

## 2022-03-20 NOTE — Telephone Encounter (Signed)
Pt called and was wondering if she can get something for her pain ? ?CB [phone]  ?

## 2022-03-23 ENCOUNTER — Encounter: Payer: Self-pay | Admitting: Orthopaedic Surgery

## 2022-03-23 ENCOUNTER — Ambulatory Visit (INDEPENDENT_AMBULATORY_CARE_PROVIDER_SITE_OTHER): Payer: Medicare Other | Admitting: Orthopaedic Surgery

## 2022-03-23 DIAGNOSIS — M75121 Complete rotator cuff tear or rupture of right shoulder, not specified as traumatic: Secondary | ICD-10-CM

## 2022-03-23 NOTE — Progress Notes (Signed)
? ?Office Visit Note ?  ?Patient: Caroline Mason           ?Date of Birth: April 21, 1956           ?MRN: [id number] ?Visit Date: 03/23/2022 ?             ?Requested by: Curlene Labrum, MD ?[address] ?[place],  [place] [address] ?PCP: Curlene Labrum, MD ? ? ?Assessment & Plan: ?Visit Diagnoses: Right shoulder pain ? ?Plan: Francenia is 1 week status post right shoulder arthroscopy debridement biceps tenotomy.  Subacromial decompression.  Distal clavicle excision.  Mini open rotator cuff repair.  She is overall managing her pain well.  She denies fever, chills, or calf pain.  She should still continue to use the sling.  We will send an order for physical therapy for 6 weeks of passive range of motion only.  She will follow-up in 2 weeks ? ?Follow-Up Instructions: No follow-ups on file.  ? ?Orders:  ?No orders of the defined types were placed in this encounter. ? ?No orders of the defined types were placed in this encounter. ? ? ? ? Procedures: ?No procedures performed ? ? ?Clinical Data: ?No additional findings. ? ? ?Subjective: ?No chief complaint on file. ?Patient presents today for follow up on her right shoulder. She had a right shoulder arthroscopy on 03/16/2022. She is now a week out from surgery. She is taking over the counter pain medicine.  ? ? ? ?Review of Systems  ?All other systems reviewed and are negative. ? ? ?Objective: ?Vital Signs: There were no vitals taken for this visit. ? ?Physical Exam ?Constitutional:   ?   Appearance: Normal appearance.  ?Neurological:  ?   Mental Status: She is alert.  ? ? ?Ortho Exam ?Examination of her right shoulder she has well-healed mini incision and arthroscopic portals.  No erythema no drainage.  Distal grip strength is intact.  No signs of infection ?Specialty Comments:  ?No specialty comments available. ? ?Imaging: ?No results found. ? ? ?PMFS History: ?Patient Active Problem List  ? Diagnosis Date Noted  ? Unspecified rotator cuff tear or rupture of right shoulder,  not specified as traumatic 02/15/2022  ? Ganglion cyst of wrist, right 08/14/2018  ? Trigger thumb, right thumb 03/27/2018  ? High risk medication use 12/01/2016  ? Rheumatoid arthritis with rheumatoid factor of multiple sites without organ or systems involvement ([place]) 12/01/2016  ? Fibromyalgia 12/01/2016  ? Primary osteoarthritis of both hands 12/01/2016  ? Primary osteoarthritis of both knees 12/01/2016  ? Primary osteoarthritis of both feet 12/01/2016  ? Bradycardia 07/18/2011  ? Chronic systolic heart failure ([place]) 06/05/2011  ? ANEMIA 03/01/2011  ? TOBACCO ABUSE 01/03/2011  ? OBSTRUCTIVE SLEEP APNEA 01/03/2011  ? ESSENTIAL HYPERTENSION, BENIGN 01/03/2011  ? Secondary cardiomyopathy ([place]) 01/03/2011  ? COPD 01/03/2011  ? ?Past Medical History:  ?Diagnosis Date  ? Atypical pneumonia   ? 12/11  ? Cardiomyopathy   ? Nonischemic, normal coronaries, LVEF 40-45%  ? COPD (chronic obstructive pulmonary disease) ([place])   ? Depression   ? Essential hypertension   ? Fibromyalgia   ? Morbid obesity ([place])   ? Obstructive sleep apnea   ? Rheumatoid arthritis(714.0)   ?  ?Family History  ?Problem Relation Age of Onset  ? Stroke Mother   ? Hypertension Mother   ? Heart failure Sister 22  ? Breast cancer Sister   ? Heart failure Sister 26  ? Cancer Sister   ? Ovarian cancer  Sister   ? Diabetes Maternal Grandmother   ? Diabetes Paternal Grandfather   ?  ?Past Surgical History:  ?Procedure Laterality Date  ? CYST REMOVAL TRUNK    ? TUBAL LIGATION  1989  ? WRIST SURGERY  2016  ? ?Social History  ? ?Occupational History  ? Occupation: Energy manager  ?  Comment: [employer]  ?Tobacco Use  ? Smoking status: Some Days  ?  Packs/day: 0.30  ?  Years: 30.00  ?  Pack years: 9.00  ?  Types: Cigarettes  ?  Start date: 06/03/1976  ? Smokeless tobacco: Never  ? Tobacco comments:  ?  smokes 3-4 per day again  ?Vaping Use  ? Vaping Use: Never used  ?Substance and Sexual Activity  ? Alcohol use: No  ?  Alcohol/week: 0.0 standard  drinks  ? Drug use: No  ? Sexual activity: Yes  ?  Birth control/protection: None  ? ? ? ? ? ? ?

## 2022-03-27 DIAGNOSIS — Z1211 Encounter for screening for malignant neoplasm of colon: Secondary | ICD-10-CM | POA: Diagnosis not present

## 2022-03-30 ENCOUNTER — Encounter (INDEPENDENT_AMBULATORY_CARE_PROVIDER_SITE_OTHER): Payer: Self-pay | Admitting: *Deleted

## 2022-04-10 DIAGNOSIS — M75121 Complete rotator cuff tear or rupture of right shoulder, not specified as traumatic: Secondary | ICD-10-CM | POA: Diagnosis not present

## 2022-04-12 ENCOUNTER — Ambulatory Visit (INDEPENDENT_AMBULATORY_CARE_PROVIDER_SITE_OTHER): Payer: Medicare Other | Admitting: Orthopaedic Surgery

## 2022-04-12 ENCOUNTER — Encounter: Payer: Self-pay | Admitting: Orthopaedic Surgery

## 2022-04-12 DIAGNOSIS — M75121 Complete rotator cuff tear or rupture of right shoulder, not specified as traumatic: Secondary | ICD-10-CM

## 2022-04-12 NOTE — Progress Notes (Signed)
? ?Office Visit Note ?  ?Patient: Caroline Mason           ?Date of Birth: 07-02-1956           ?MRN: [id number] ?Visit Date: 04/12/2022 ?             ?Requested by: Curlene Labrum, MD ?[address] ?[place],  [place] [address] ?PCP: Curlene Labrum, MD ? ? ?Assessment & Plan: ?Visit Diagnoses:  ?1. Complete tear of right rotator cuff, unspecified whether traumatic   ? ? ?Plan: Ms. Doutt is 1 month status post right shoulder surgery including an arthroscopic SA D, DCR and mini open rotator cuff tear repair.  I performed a biceps tenodesis.  She has been only had 1 therapy session but notes that she is doing well and very comfortable.  She does not have the pain she had preoperatively.  She has a new job that she be working part-time sometime towards the mid to the end of May and will need a note for that work.  I will plan to see her back in 2 weeks and then reassess his work status.  Meantime of encouraged her to work with range of motion and stretching as she has a little bit of capsulitis ? ?Follow-Up Instructions: Return in about 2 weeks (around 04/26/2022).  ? ?Orders:  ?No orders of the defined types were placed in this encounter. ? ?No orders of the defined types were placed in this encounter. ? ? ? ? Procedures: ?No procedures performed ? ? ?Clinical Data: ?No additional findings. ? ? ?Subjective: ?Chief Complaint  ?Patient presents with  ? Right Shoulder - Follow-up  ?  Right shoulder arthroscopy 03/16/2022  ?Patient presents today for follow up on her right shoulder. She had a right shoulder arthroscopy on 03/16/2022. She is now almost 4 weeks out from surgery. She started physical therapy this week. She is doing well. She takes over the counter pain medicine as needed.  ? ?HPI ? ?Review of Systems ? ? ?Objective: ?Vital Signs: There were no vitals taken for this visit. ? ?Physical Exam ? ?Ortho Exam right shoulder incisions of healed without a problem.  I was able to place her arm almost all the way overhead  only lacking about 20 degrees to full overhead motion.  There was 90 degrees of abduction.  Negative impingement.  Good grip and release ? ?Specialty Comments:  ?No specialty comments available. ? ?Imaging: ?No results found. ? ? ?PMFS History: ?Patient Active Problem List  ? Diagnosis Date Noted  ? Unspecified rotator cuff tear or rupture of right shoulder, not specified as traumatic 02/15/2022  ? Ganglion cyst of wrist, right 08/14/2018  ? Trigger thumb, right thumb 03/27/2018  ? High risk medication use 12/01/2016  ? Rheumatoid arthritis with rheumatoid factor of multiple sites without organ or systems involvement ([place]) 12/01/2016  ? Fibromyalgia 12/01/2016  ? Primary osteoarthritis of both hands 12/01/2016  ? Primary osteoarthritis of both knees 12/01/2016  ? Primary osteoarthritis of both feet 12/01/2016  ? Bradycardia 07/18/2011  ? Chronic systolic heart failure ([place]) 06/05/2011  ? ANEMIA 03/01/2011  ? TOBACCO ABUSE 01/03/2011  ? OBSTRUCTIVE SLEEP APNEA 01/03/2011  ? ESSENTIAL HYPERTENSION, BENIGN 01/03/2011  ? Secondary cardiomyopathy ([place]) 01/03/2011  ? COPD 01/03/2011  ? ?Past Medical History:  ?Diagnosis Date  ? Atypical pneumonia   ? 12/11  ? Cardiomyopathy   ? Nonischemic, normal coronaries, LVEF 40-45%  ? COPD (chronic obstructive pulmonary disease) ([place])   ?  Depression   ? Essential hypertension   ? Fibromyalgia   ? Morbid obesity ([place])   ? Obstructive sleep apnea   ? Rheumatoid arthritis(714.0)   ?  ?Family History  ?Problem Relation Age of Onset  ? Stroke Mother   ? Hypertension Mother   ? Heart failure Sister 36  ? Breast cancer Sister   ? Heart failure Sister 101  ? Cancer Sister   ? Ovarian cancer Sister   ? Diabetes Maternal Grandmother   ? Diabetes Paternal Grandfather   ?  ?Past Surgical History:  ?Procedure Laterality Date  ? CYST REMOVAL TRUNK    ? TUBAL LIGATION  1989  ? WRIST SURGERY  2016  ? ?Social History  ? ?Occupational History  ? Occupation: Energy manager  ?  Comment: [employer]  ?Tobacco Use  ? Smoking status: Some Days  ?  Packs/day: 0.30  ?  Years: 30.00  ?  Pack years: 9.00  ?  Types: Cigarettes  ?  Start date: 06/03/1976  ? Smokeless tobacco: Never  ? Tobacco comments:  ?  smokes 3-4 per day again  ?Vaping Use  ? Vaping Use: Never used  ?Substance and Sexual Activity  ? Alcohol use: No  ?  Alcohol/week: 0.0 standard drinks  ? Drug use: No  ? Sexual activity: Yes  ?  Birth control/protection: None  ? ? ? ? ? ? ?

## 2022-04-13 ENCOUNTER — Encounter (INDEPENDENT_AMBULATORY_CARE_PROVIDER_SITE_OTHER): Payer: Self-pay | Admitting: *Deleted

## 2022-04-13 DIAGNOSIS — M75121 Complete rotator cuff tear or rupture of right shoulder, not specified as traumatic: Secondary | ICD-10-CM | POA: Diagnosis not present

## 2022-04-17 DIAGNOSIS — M75121 Complete rotator cuff tear or rupture of right shoulder, not specified as traumatic: Secondary | ICD-10-CM | POA: Diagnosis not present

## 2022-04-20 DIAGNOSIS — M75121 Complete rotator cuff tear or rupture of right shoulder, not specified as traumatic: Secondary | ICD-10-CM | POA: Diagnosis not present

## 2022-04-24 DIAGNOSIS — M75121 Complete rotator cuff tear or rupture of right shoulder, not specified as traumatic: Secondary | ICD-10-CM | POA: Diagnosis not present

## 2022-04-26 ENCOUNTER — Ambulatory Visit (INDEPENDENT_AMBULATORY_CARE_PROVIDER_SITE_OTHER): Payer: Medicare Other | Admitting: Orthopaedic Surgery

## 2022-04-26 ENCOUNTER — Encounter: Payer: Self-pay | Admitting: Orthopaedic Surgery

## 2022-04-26 DIAGNOSIS — M75121 Complete rotator cuff tear or rupture of right shoulder, not specified as traumatic: Secondary | ICD-10-CM | POA: Diagnosis not present

## 2022-04-26 NOTE — [place] Notes (Signed)
? ?Office Visit Note ?  ?Patient: Caroline Mason           ?Date of Birth: [birth year]           ?MRN: [id number] ?Visit Date: 04/26/2022 ?             ?Requested by: Curlene Labrum, MD ?[address] ?[place],  [place] [address] ?PCP: Curlene Labrum, MD ? ? ?Assessment & Plan: ?Visit Diagnoses:  ?1. Complete tear of right rotator cuff, unspecified whether traumatic   ? ? ?Plan: Mrs. Ozawa is about 6 weeks status post rotator cuff tear repair of the right shoulder with a mini open approach and a biceps tenotomy.  She is really doing very well.  She is involved in physical therapy and relates not having any appreciable pain.  She can now stop the sling and begin active assisted to active range of motion I will check her back in a month.  She is a little tight in overhead flexion ? ?Follow-Up Instructions: Return in about 1 month (around 05/27/2022).  ? ?Orders:  ?No orders of the defined types were placed in this encounter. ? ?No orders of the defined types were placed in this encounter. ? ? ? ? Procedures: ?No procedures performed ? ? ?Clinical Data: ?No additional findings. ? ? ?Subjective: ?Chief Complaint  ?Patient presents with  ? Right Shoulder - Follow-up  ?  DOS 03/16/22  ?Patient presents today for follow up on her shoulder. She had a right shoulder arthroscopy on 03/16/2022. She is 6 weeks out from surgery. She is going to physical therapy twice weekly. She states that she is doing well. She is taking over the counter pain medicines. ? ?HPI ? ?Review of Systems ? ? ?Objective: ?Vital Signs: There were no vitals taken for this visit. ? ?Physical Exam ? ?Ortho Exam right shoulder incisions are healing without problem.  She lacked about 20 degrees to full overhead motion but abducted to 90 degrees and some loss of internal rotation consistent with a very mild adhesive capsulitis.  Negative impingement.  No pain over the [facility] joint or the anterior subacromial region.  No pain about the biceps.  Good grip and  release ? ?Specialty Comments:  ?No specialty comments available. ? ?Imaging: ?No results found. ? ? ?PMFS History: ?Patient Active Problem List  ? Diagnosis Date Noted  ? Unspecified rotator cuff tear or rupture of right shoulder, not specified as traumatic 02/15/2022  ? Ganglion cyst of wrist, right 08/14/2018  ? Trigger thumb, right thumb 03/27/2018  ? High risk medication use 12/01/2016  ? Rheumatoid arthritis with rheumatoid factor of multiple sites without organ or systems involvement ([place]) 12/01/2016  ? Fibromyalgia 12/01/2016  ? Primary osteoarthritis of both hands 12/01/2016  ? Primary osteoarthritis of both knees 12/01/2016  ? Primary osteoarthritis of both feet 12/01/2016  ? Bradycardia 07/18/2011  ? Chronic systolic heart failure ([place]) 06/05/2011  ? ANEMIA 03/01/2011  ? TOBACCO ABUSE 01/03/2011  ? OBSTRUCTIVE SLEEP APNEA 01/03/2011  ? ESSENTIAL HYPERTENSION, BENIGN 01/03/2011  ? Secondary cardiomyopathy ([place]) 01/03/2011  ? COPD 01/03/2011  ? ?Past Medical History:  ?Diagnosis Date  ? Atypical pneumonia   ? 12/11  ? Cardiomyopathy   ? Nonischemic, normal coronaries, LVEF 40-45%  ? COPD (chronic obstructive pulmonary disease) ([place])   ? Depression   ? Essential hypertension   ? Fibromyalgia   ? Morbid obesity ([place])   ? Obstructive sleep apnea   ? Rheumatoid arthritis(714.0)   ?  ?  Family History  ?Problem Relation Age of Onset  ? Stroke Mother   ? Hypertension Mother   ? Heart failure Sister 61  ? Breast cancer Sister   ? Heart failure Sister 28  ? Cancer Sister   ? Ovarian cancer Sister   ? Diabetes Maternal Grandmother   ? Diabetes Paternal Grandfather   ?  ?Past Surgical History:  ?Procedure Laterality Date  ? CYST REMOVAL TRUNK    ? TUBAL LIGATION  1989  ? WRIST SURGERY  2016  ? ?Social History  ? ?Occupational History  ? Occupation: Energy manager  ?  Comment: [employer]  ?Tobacco Use  ? Smoking status: Some Days  ?  Packs/day: 0.30  ?  Years: 30.00  ?  Pack years: 9.00  ?  Types:  Cigarettes  ?  Start date: 06/03/1976  ? Smokeless tobacco: Never  ? Tobacco comments:  ?  smokes 3-4 per day again  ?Vaping Use  ? Vaping Use: Never used  ?Substance and Sexual Activity  ? Alcohol use: No  ?  Alcohol/week: 0.0 standard drinks  ? Drug use: No  ? Sexual activity: Yes  ?  Birth control/protection: None  ? ? ? ? ? ? ?

## 2022-05-01 DIAGNOSIS — M75121 Complete rotator cuff tear or rupture of right shoulder, not specified as traumatic: Secondary | ICD-10-CM | POA: Diagnosis not present

## 2022-05-04 DIAGNOSIS — M75121 Complete rotator cuff tear or rupture of right shoulder, not specified as traumatic: Secondary | ICD-10-CM | POA: Diagnosis not present

## 2022-05-08 DIAGNOSIS — M75121 Complete rotator cuff tear or rupture of right shoulder, not specified as traumatic: Secondary | ICD-10-CM | POA: Diagnosis not present

## 2022-05-10 DIAGNOSIS — M75121 Complete rotator cuff tear or rupture of right shoulder, not specified as traumatic: Secondary | ICD-10-CM | POA: Diagnosis not present

## 2022-05-12 IMAGING — MR MR SHOULDER*R* W/O CM
6 series · 40 of 40 positions shown · non-contrast
Comparison: X-ray 11/22/2021.

CLINICAL DATA: Chronic right shoulder pain, status post fall 3
months ago.

EXAM:
MRI OF THE RIGHT SHOULDER WITHOUT CONTRAST
TECHNIQUE: Multiplanar, multisequence MR imaging of the shoulder was performed.
No intravenous contrast was administered.

[Series 5: T2 fat-sat · axial · right · 4.0mm · 0.47mm/px · z∈[-82,+37]mm · 6 of 26 slices shown (1 of 4)]
[im 1/26]
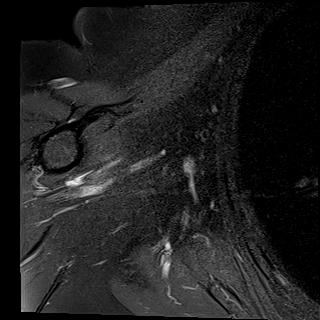
[im 6/26]
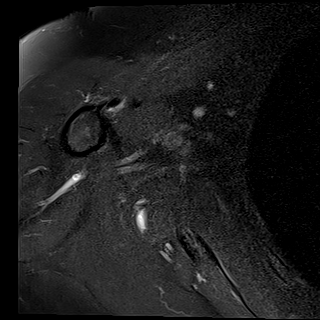
[im 11/26]
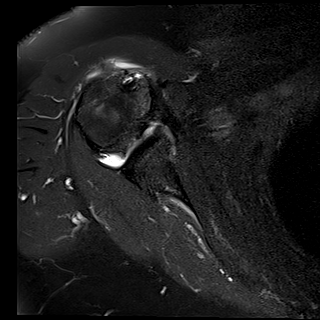
[im 16/26]
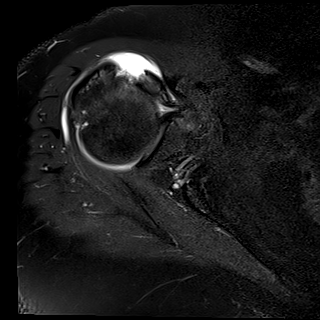
[im 21/26]
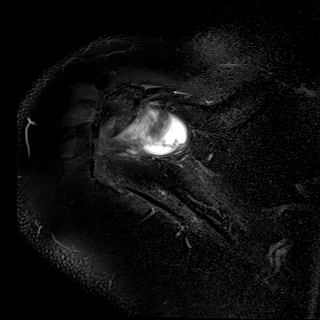
[im 26/26]
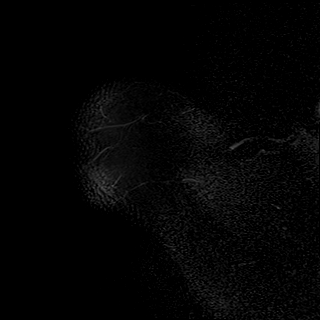

[Series 6: T2 fat-sat · oblique · right · 4.0mm · 0.47mm/px · 6 of 25 slices shown (2 of 4)]
[im 1/25]
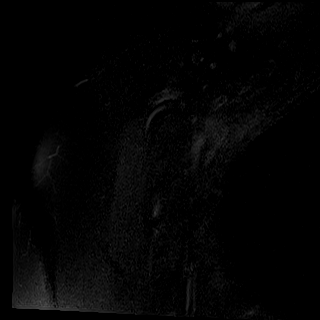
[im 5/25]
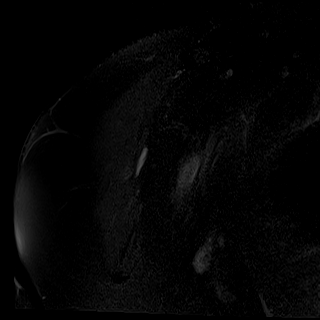
[im 10/25]
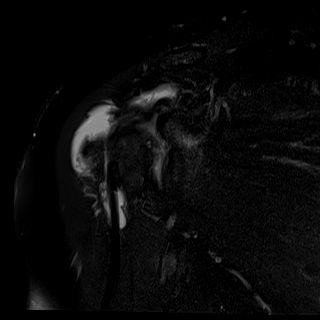
[im 15/25]
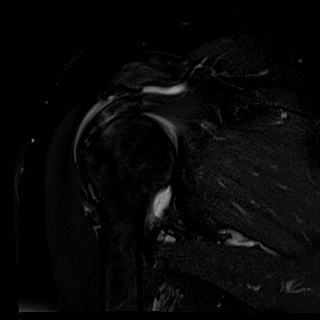
[im 20/25]
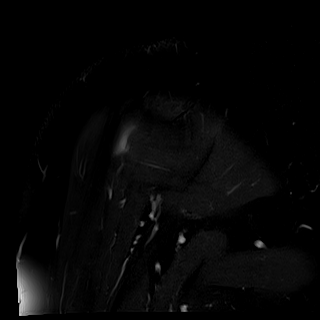
[im 25/25]
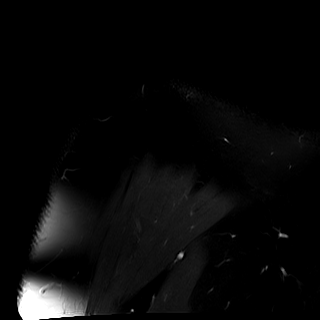

[Series 7: PD · oblique · right · 4.0mm · 0.47mm/px · 7 of 25 slices shown]
[im 1/25]
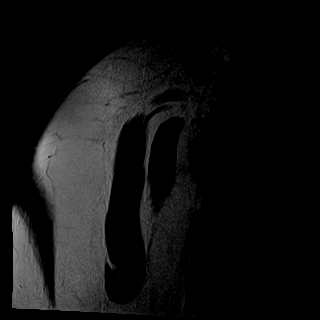
[im 5/25]
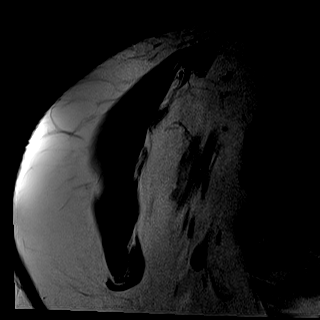
[im 9/25]
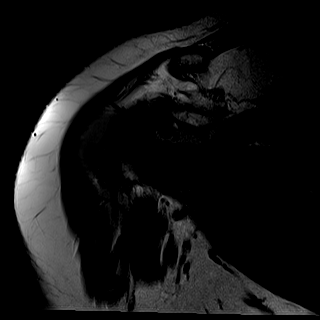
[im 13/25]
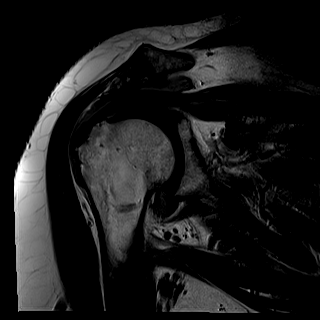
[im 17/25]
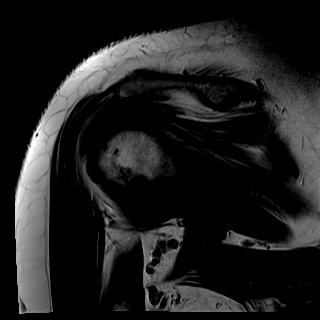
[im 21/25]
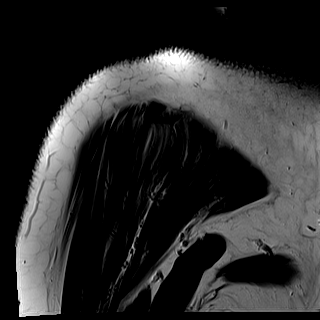
[im 25/25]
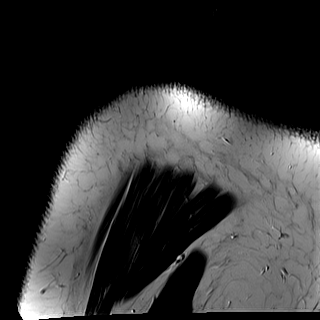

[Series 8: T2 fat-sat · oblique · right · 4.0mm · 0.44mm/px · 7 of 25 slices shown (3 of 4)]
[im 1/25]
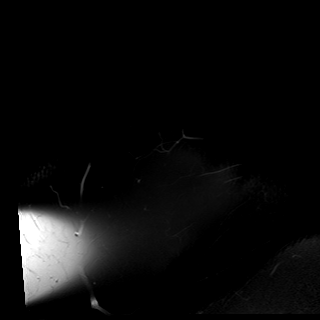
[im 5/25]
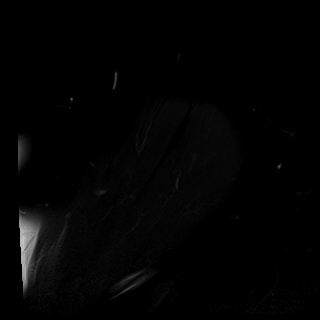
[im 9/25]
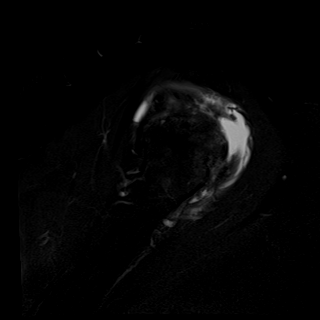
[im 13/25]
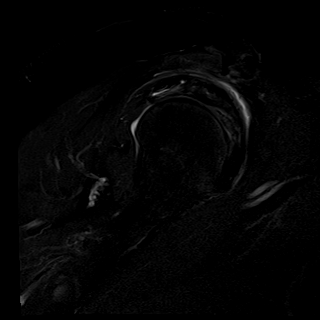
[im 17/25]
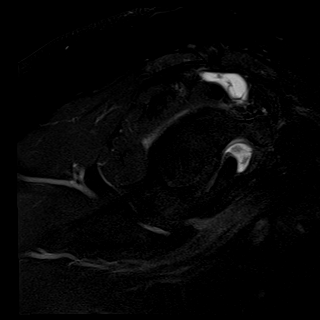
[im 21/25]
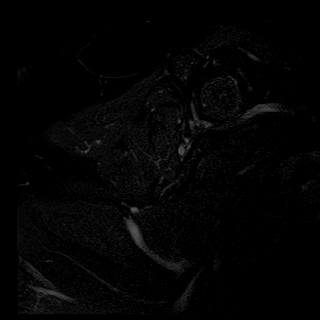
[im 25/25]
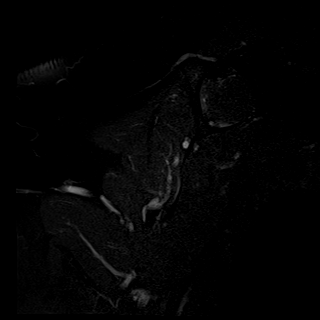

[Series 9: T1 · oblique · right · 4.0mm · 0.41mm/px · 7 of 25 slices shown]
[im 1/25]
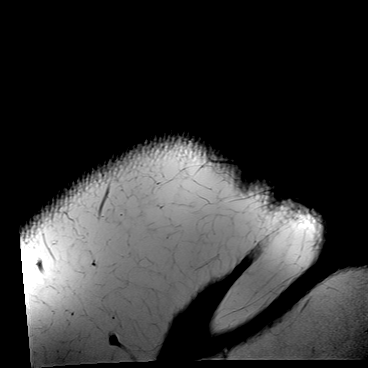
[im 5/25]
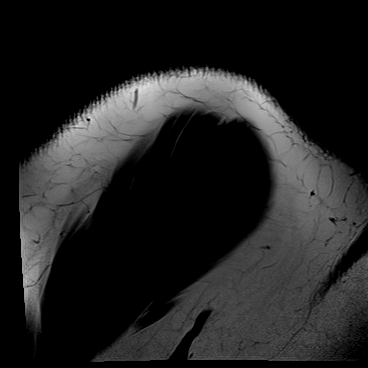
[im 9/25]
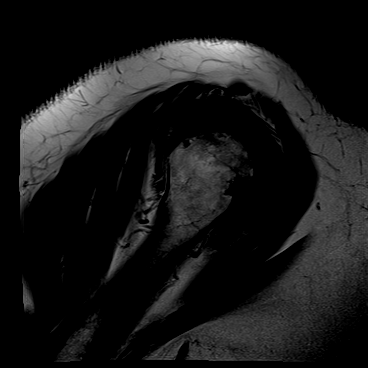
[im 13/25]
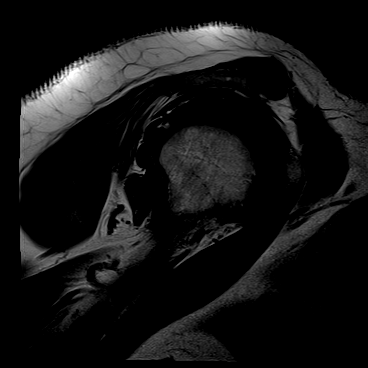
[im 17/25]
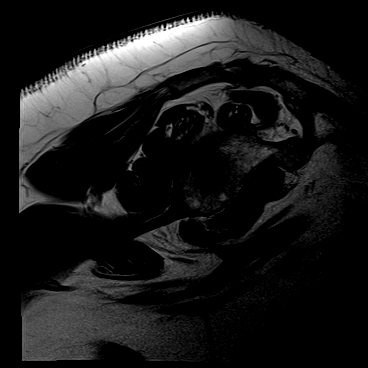
[im 21/25]
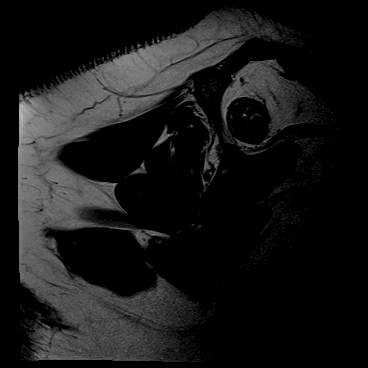
[im 25/25]
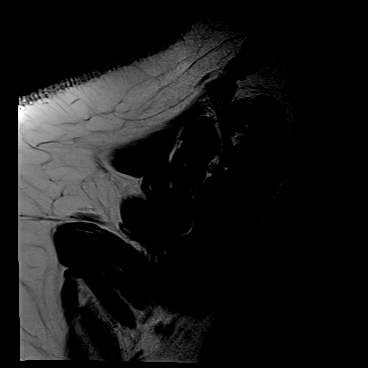

[Series 10: T2 fat-sat · axial · right · 4.0mm · 0.59mm/px · z∈[-87,+33]mm · 7 of 26 slices shown (4 of 4)]
[im 1/26]
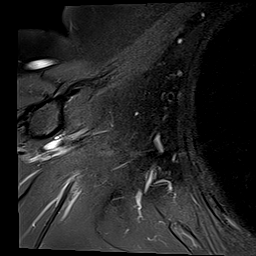
[im 5/26]
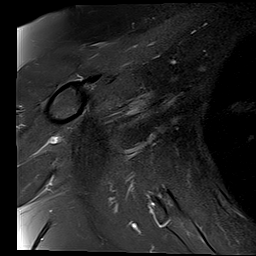
[im 9/26]
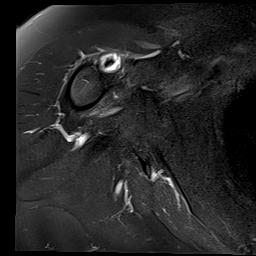
[im 13/26]
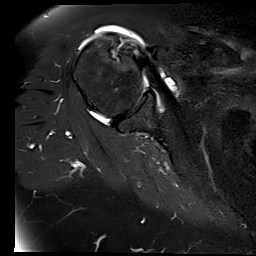
[im 17/26]
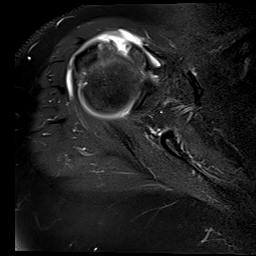
[im 21/26]
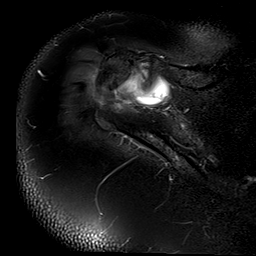
[im 26/26]
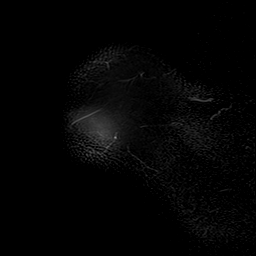

[40 of 40 positions shown; findings below may reference images not displayed]

FINDINGS: Rotator cuff: Severe tendinosis of the supraspinatus tendon with a
large full-thickness, near complete, tear with 14 mm of retraction
and a few intact posterior fibers. Severe tendinosis of the
infraspinatus tendon with an interstitial tear at the
musculotendinous junction. Teres minor tendon is intact. Mild
tendinosis of the subscapularis tendon with a small
partial-thickness tear of the superior peripheral fibers.

Muscles: No muscle atrophy or edema. No intramuscular fluid
collection or hematoma.

Biceps Long Head: Severe tendinosis of the intra-articular portion
of the long head of the biceps tendon.

Acromioclavicular Joint: Moderate arthropathy of the
acromioclavicular joint. Moderate amount of subacromial/subdeltoid
bursal fluid.

Glenohumeral Joint: Small joint effusion.  No chondral defect.

Labrum: Superior labral degeneration.

Bones: No fracture or dislocation. No aggressive osseous lesion.

Other: No fluid collection or hematoma.
IMPRESSION: 1. Severe tendinosis of the supraspinatus tendon with a large
full-thickness, near complete, tear with 14 mm of retraction and a
few intact posterior fibers.
2. Severe tendinosis of the infraspinatus tendon with an
interstitial tear at the musculotendinous junction.
3. Mild tendinosis of the subscapularis tendon with a small
partial-thickness tear of the superior peripheral fibers.
4. Severe tendinosis of the intra-articular portion of the long head
of the biceps tendon.

## 2022-05-16 DIAGNOSIS — M75121 Complete rotator cuff tear or rupture of right shoulder, not specified as traumatic: Secondary | ICD-10-CM | POA: Diagnosis not present

## 2022-05-18 DIAGNOSIS — M75121 Complete rotator cuff tear or rupture of right shoulder, not specified as traumatic: Secondary | ICD-10-CM | POA: Diagnosis not present

## 2022-05-23 DIAGNOSIS — M75121 Complete rotator cuff tear or rupture of right shoulder, not specified as traumatic: Secondary | ICD-10-CM | POA: Diagnosis not present

## 2022-05-24 ENCOUNTER — Encounter: Payer: Self-pay | Admitting: *Deleted

## 2022-05-24 ENCOUNTER — Encounter: Payer: Self-pay | Admitting: Orthopaedic Surgery

## 2022-05-24 ENCOUNTER — Ambulatory Visit (INDEPENDENT_AMBULATORY_CARE_PROVIDER_SITE_OTHER): Payer: Medicare Other | Admitting: Cardiology

## 2022-05-24 ENCOUNTER — Ambulatory Visit (INDEPENDENT_AMBULATORY_CARE_PROVIDER_SITE_OTHER): Payer: Medicare Other | Admitting: Orthopaedic Surgery

## 2022-05-24 ENCOUNTER — Encounter: Payer: Self-pay | Admitting: Cardiology

## 2022-05-24 VITALS — BP 132/80 | HR 74 | Ht 65.0 in | Wt 244.9 lb

## 2022-05-24 DIAGNOSIS — I428 Other cardiomyopathies: Secondary | ICD-10-CM | POA: Diagnosis not present

## 2022-05-24 DIAGNOSIS — I10 Essential (primary) hypertension: Secondary | ICD-10-CM

## 2022-05-24 DIAGNOSIS — M75121 Complete rotator cuff tear or rupture of right shoulder, not specified as traumatic: Secondary | ICD-10-CM

## 2022-05-24 MED ORDER — DAPAGLIFLOZIN PROPANEDIOL 10 MG PO TABS: 10.0000 mg | ORAL_TABLET | Freq: Every day | ORAL | 6 refills | Status: DC

## 2022-05-24 NOTE — [place] Notes (Signed)
Office Visit Note   Patient: Caroline Mason           Date of Birth: 04/15/1956           MRN: [id number] Visit Date: 05/24/2022              Requested by: Caroline Labrum, MD [place],  [place] [address] PCP: Caroline Labrum, MD   Assessment & Plan: Visit Diagnoses:  1. Complete tear of right rotator cuff, unspecified whether traumatic     Plan: Caroline Mason is approximately 2 months status post rotator cuff tear repair of her right shoulder and doing very well.  She is very happy with the present course and relates not having any pain.  She has not completed her course of therapy but will up for the next week or so.  She does not work.  She still is weak and needs to work on strengthening exercises but overall has done exceptionally well played will urged her to work on her strengthening exercise and plan to see her back as needed.  Follow-Up Instructions: Return if symptoms worsen or fail to improve.   Orders:  No orders of the defined types were placed in this encounter.  No orders of the defined types were placed in this encounter.     Procedures: No procedures performed   Clinical Data: No additional findings.   Subjective: Chief Complaint  Patient presents with   Right Shoulder - Follow-up    Right shoulder arthroscopy 03/16/2022  Patient presents today for follow up on her right shoulder. She had right shoulder arthroscopy on 03/16/2022. She states that overall she is doing much better. She goes to physical therapy twice weekly. She takes Ibuprofen as needed.   HPI  Review of Systems   Objective: Vital Signs: There were no vitals taken for this visit.  Physical Exam  Ortho Exam awake alert and oriented x3.  Comfortable sitting.  No acute distress.  Able to quickly place her right arm over her head with full flexion and abduction.  Negative impingement and empty can testing.  Incisions of healed nicely.  Skin intact.  Good grip and good release.  I  think she is a little bit weak with abduction and with flexion  Specialty Comments:  No specialty comments available.  Imaging: No results found.   PMFS History: Patient Active Problem List   Diagnosis Date Noted   Unspecified rotator cuff tear or rupture of right shoulder, not specified as traumatic 02/15/2022   Ganglion cyst of wrist, right 08/14/2018   Trigger thumb, right thumb 03/27/2018   High risk medication use 12/01/2016   Rheumatoid arthritis with rheumatoid factor of multiple sites without organ or systems involvement ([place]) 12/01/2016   Fibromyalgia 12/01/2016   Primary osteoarthritis of both hands 12/01/2016   Primary osteoarthritis of both knees 12/01/2016   Primary osteoarthritis of both feet 12/01/2016   Bradycardia 74/11/8785   Chronic systolic heart failure ([place]) 06/05/2011   ANEMIA 03/01/2011   TOBACCO ABUSE 01/03/2011   OBSTRUCTIVE SLEEP APNEA 01/03/2011   ESSENTIAL HYPERTENSION, BENIGN 01/03/2011   Secondary cardiomyopathy ([place]) 01/03/2011   COPD 01/03/2011   Past Medical History:  Diagnosis Date   Atypical pneumonia    12/11   Cardiomyopathy    Nonischemic, normal coronaries, LVEF 40-45%   COPD (chronic obstructive pulmonary disease) ([facility])    Depression    Essential hypertension    Fibromyalgia    Morbid obesity ([place])  Obstructive sleep apnea    Rheumatoid arthritis(714.0)     Family History  Problem Relation Age of Onset   Stroke Mother    Hypertension Mother    Heart failure Sister 34   Breast cancer Sister    Heart failure Sister 78   Cancer Sister    Ovarian cancer Sister    Diabetes Maternal Grandmother    Diabetes Paternal Grandfather     Past Surgical History:  Procedure Laterality Date   CYST REMOVAL TRUNK     TUBAL LIGATION  1989   WRIST SURGERY  2016   Social History   Occupational History   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

## 2022-05-24 NOTE — Patient Instructions (Addendum)
Medication Instructions:  Your physician has recommended you make the following change in your medication:  Start farxiga 10 mg daily Continue other medications the same  Labwork: none  Testing/Procedures: Your physician has requested that you have an echocardiogram in 6 months just before your next visit. Echocardiography is a painless test that uses sound waves to create images of your heart. It provides your doctor with information about the size and shape of your heart and how well your heart's chambers and valves are working. This procedure takes approximately one hour. There are no restrictions for this procedure.  Follow-Up: Your physician recommends that you schedule a follow-up appointment in: 6 months  Any Other Special Instructions Will Be Listed Below (If Applicable).  If you need a refill on your cardiac medications before your next appointment, please call your pharmacy.

## 2022-05-24 NOTE — Progress Notes (Signed)
Cardiology Office Note  Date: 05/24/2022   ID: Caroline Mason, DOB September 13, 1956, MRN [id number]  PCP:  [facility]  Cardiologist:  Rozann Lesches, MD Electrophysiologist:  None   Chief Complaint  Patient presents with   Cardiac follow-up    History of Present Illness: Caroline Mason is a 66 y.o. female last seen in August 2022.  She is here for a follow-up visit.  Reports NYHA class II dyspnea with typical activities, no obvious fluid gain.  Last echocardiogram in September 2022 showed LVEF 45 to 50% range.  We went over her medications which are outlined below.  Also discussed addition of Farxiga to round out regimen further.  Requesting interval lab work from Dr. Pleas Koch.  Past Medical History:  Diagnosis Date   Atypical pneumonia    12/11   Cardiomyopathy    Nonischemic, normal coronaries, LVEF 40-45%   COPD (chronic obstructive pulmonary disease) (HCC)    Depression    Essential hypertension    Fibromyalgia    Morbid obesity (HCC)    Obstructive sleep apnea    Rheumatoid arthritis(714.0)     Past Surgical History:  Procedure Laterality Date   CYST REMOVAL TRUNK     TUBAL LIGATION  1989   WRIST SURGERY  2016    Current Outpatient Medications  Medication Sig Dispense Refill   amLODipine (NORVASC) 10 MG tablet Take 10 mg by mouth daily.     atorvastatin (LIPITOR) 10 MG tablet Take 10 mg by mouth daily.     carvedilol (COREG) 3.125 MG tablet Take 1 tablet (3.125 mg total) by mouth 2 (two) times daily. 180 tablet 1   Chlorpheniramine-PSE-Ibuprofen 2-30-200 MG TABS Take 1 tablet by mouth daily as needed.     cholecalciferol (VITAMIN D) 1000 units tablet Take 1,000 Units by mouth daily.     dapagliflozin propanediol (FARXIGA) 10 MG TABS tablet Take 1 tablet (10 mg total) by mouth daily before breakfast. 30 tablet 6   docusate sodium (COLACE) 100 MG capsule Take 100 mg by mouth as needed.     furosemide (LASIX) 20 MG tablet Take 1 tablet (20 mg  total) by mouth daily. 90 tablet 2   gabapentin (NEURONTIN) 300 MG capsule Take 300 mg by mouth 2 (two) times daily.     guaiFENesin-dextromethorphan (ROBITUSSIN DM) 100-10 MG/5ML syrup Take 5 mLs by mouth 3 (three) times daily as needed.     hydroxychloroquine (PLAQUENIL) 200 MG tablet TAKE 1 TABLET(200 MG) BY MOUTH TWICE DAILY 180 tablet 0   oxyCODONE-acetaminophen (PERCOCET/ROXICET) 5-325 MG tablet Take 1 tablet by mouth every 4 (four) hours as needed for severe pain. 30 tablet 0   oxyCODONE-acetaminophen (PERCOCET/ROXICET) 5-325 MG tablet Take 1 tablet by mouth every 8 (eight) hours as needed for severe pain. 30 tablet 0   sacubitril-valsartan (ENTRESTO) 49-51 MG Take 1 tablet by mouth 2 (two) times daily. Start on Thursday 11/14/18 60 tablet 6   sertraline (ZOLOFT) 100 MG tablet Take 100 mg by mouth daily.     spironolactone (ALDACTONE) 25 MG tablet TAKE 1 TABLET(25 MG) BY MOUTH DAILY 90 tablet 0   No current facility-administered medications for this visit.   Allergies:  Aspirin   ROS: No palpitations or syncope.  Interval diagnosis of diverticulitis.  Also status post shoulder surgery.  Physical Exam: VS:  BP 132/80   Pulse 74   Ht '5\' 5"'$  (1.651 m)   Wt 244 lb 14.4 oz (111.1 kg)   SpO2 95%  BMI 40.75 kg/m , BMI Body mass index is 40.75 kg/m.  Wt Readings from Last 3 Encounters:  05/24/22 244 lb 14.4 oz (111.1 kg)  02/15/22 241 lb (109.3 kg)  11/22/21 241 lb 12.8 oz (109.7 kg)    General: Patient appears comfortable at rest. HEENT: Conjunctiva and lids normal. Neck: Supple, no elevated JVP or carotid bruits, no thyromegaly. Lungs: Clear to auscultation, nonlabored breathing at rest. Cardiac: Regular rate and rhythm, no S3 or significant systolic murmur, no pericardial rub. Extremities: No pitting edema.  ECG:  An ECG dated 08/16/2021 was personally reviewed today and demonstrated:  Sinus rhythm with increased voltage and poor R wave progression.  Recent  Labwork: 11/22/2021: ALT 8; AST 13; BUN 10; Creat 0.63; Hemoglobin 13.1; Platelets 274; Potassium 4.1; Sodium 141   Other Studies Reviewed Today:  Echocardiogram 09/08/2021:  1. Left ventricular ejection fraction, by estimation, is 45 to 50%. The  left ventricle has mildly decreased function. The left ventricle  demonstrates global hypokinesis. There is mild left ventricular  hypertrophy. Left ventricular diastolic parameters  are indeterminate.   2. Right ventricular systolic function is normal. The right ventricular  size is normal.   3. Left atrial size was mildly dilated.   4. Right atrial size was mild to moderately dilated.   5. The mitral valve is grossly normal. Trivial mitral valve  regurgitation.   6. The aortic valve is tricuspid. Aortic valve regurgitation is not  visualized.   7. The inferior vena cava is normal in size with greater than 50%  respiratory variability, suggesting right atrial pressure of 3 mmHg.   Assessment and Plan:  1.  HFmrEF with nonischemic cardiomyopathy and LVEF 45 to 50% by last assessment.  NYHA class II dyspnea reported, weight is stable overall.  Plan to continue Coreg, Aldactone, Entresto, and Lasix.  Adding Farxiga 10 mg daily to round out regimen.  Requesting interval lab work from Dr. Pleas Koch.  We will obtain a follow-up echocardiogram for her next visit.  2.  Essential hypertension, no changes made to present antihypertensive regimen.  Medication Adjustments/Labs and Tests Ordered: Current medicines are reviewed at length with the patient today.  Concerns regarding medicines are outlined above.   Tests Ordered: Orders Placed This Encounter  Procedures   ECHOCARDIOGRAM COMPLETE    Medication Changes: Meds ordered this encounter  Medications   dapagliflozin propanediol (FARXIGA) 10 MG TABS tablet    Sig: Take 1 tablet (10 mg total) by mouth daily before breakfast.    Dispense:  30 tablet    Refill:  6    05/24/2022 NEW     Disposition:  Follow up  6 months.  Signed, Satira Sark, MD, [facility] 05/24/2022 2:16 PM    [facility] [place], [place], [place] [address] Phone: [phone]; Fax: [phone]

## 2022-05-26 DIAGNOSIS — M75121 Complete rotator cuff tear or rupture of right shoulder, not specified as traumatic: Secondary | ICD-10-CM | POA: Diagnosis not present

## 2022-06-08 ENCOUNTER — Other Ambulatory Visit (INDEPENDENT_AMBULATORY_CARE_PROVIDER_SITE_OTHER): Payer: Self-pay

## 2022-06-08 ENCOUNTER — Encounter (INDEPENDENT_AMBULATORY_CARE_PROVIDER_SITE_OTHER): Payer: Self-pay

## 2022-06-08 ENCOUNTER — Ambulatory Visit (INDEPENDENT_AMBULATORY_CARE_PROVIDER_SITE_OTHER): Payer: Medicare Other | Admitting: Gastroenterology

## 2022-06-08 ENCOUNTER — Encounter (INDEPENDENT_AMBULATORY_CARE_PROVIDER_SITE_OTHER): Payer: Self-pay | Admitting: Gastroenterology

## 2022-06-08 ENCOUNTER — Telehealth (INDEPENDENT_AMBULATORY_CARE_PROVIDER_SITE_OTHER): Payer: Self-pay

## 2022-06-08 DIAGNOSIS — R1032 Left lower quadrant pain: Secondary | ICD-10-CM | POA: Diagnosis not present

## 2022-06-08 DIAGNOSIS — R195 Other fecal abnormalities: Secondary | ICD-10-CM

## 2022-06-08 DIAGNOSIS — Z79899 Other long term (current) drug therapy: Secondary | ICD-10-CM | POA: Diagnosis not present

## 2022-06-08 MED ORDER — PEG 3350-KCL-NA BICARB-NACL 420 G PO SOLR: 4000.0000 mL | ORAL | 0 refills | Status: DC

## 2022-06-08 MED ORDER — DICYCLOMINE HCL 10 MG PO CAPS: 10.0000 mg | ORAL_CAPSULE | Freq: Two times a day (BID) | ORAL | 2 refills | Status: DC | PRN

## 2022-06-08 NOTE — Progress Notes (Signed)
Maylon Peppers, M.D. Gastroenterology & Hepatology [facility] [address] [place], [place] [address] Primary Care Physician: [facility] [place] [address]  Referring MD: PCP  Chief Complaint:  incomplete colonoscopy  History of Present Illness: Caroline Mason is a 66 y.o. female with PMH nonischemic cardiomyopathy, FBM, obesity, depression, RA, COPD,  HTN, who presents for evaluation of incomplete colonoscopy.  Patient was referred to our clinic for evaluation of incomplete colonoscopy.  She underwent a colonoscopy on 02/23/2022 with Dr. Ladona Horns at [facility].  Based on the report, the scope could only be advanced to the ascending colon.  A barium enema was performed on 03/10/2022 which did not show any extrinsic or intrinsic compression of the colon.  Patient reports that she has presented 2 years of intermittent episodes of abdominal pain in the LLQ pain, which worsens after she eats food. Pain does not radiate and is moderate in severity. Described as a dull pain. It does not happen every time she eats.  She has presented some bloating in her abdomen. Does not follow any diet or does not have a lot of fiber in her diet.  She also reports having some nausea occasionally but no vomiting. A year ago she also noticed a change in the consistency of her stools as they are softer than usual. She reports he rBMs increased to 3-4 Bms per day, but they have decreased slightly recently. Has some tenesmus occasionally. Does not have to strain.  No fever, chills, hematochezia, melena, hematemesis, a jaundice, pruritus or weight loss.  Last QPY:PPJKD Last Colonoscopy:as above  FHx: multiple nieces with IBS, neg for any gastrointestinal/liver disease, aunt colon cancer in her 48s , sister ovarian cancer, sister breast and lung cancer, multiple family members with breast cancer, some female members had prostate  cancer Social: smokes 5 cigs a day, neg alcohol or illicit drug use Surgical: tubal ligation  Past Medical History: Past Medical History:  Diagnosis Date   Atypical pneumonia    12/11   Cardiomyopathy    Nonischemic, normal coronaries, LVEF 40-45%   COPD (chronic obstructive pulmonary disease) ([place])    Depression    Essential hypertension    Fibromyalgia    Morbid obesity ([place])    Obstructive sleep apnea    Rheumatoid arthritis(714.0)     Past Surgical History: Past Surgical History:  Procedure Laterality Date   CYST REMOVAL TRUNK     TUBAL LIGATION  1989   WRIST SURGERY  2016    Family History: Family History  Problem Relation Age of Onset   Stroke Mother    Hypertension Mother    Heart failure Sister 35   Breast cancer Sister    Heart failure Sister 68   Cancer Sister    Ovarian cancer Sister    Diabetes Maternal Grandmother    Diabetes Paternal Grandfather     Social History: Social History   Tobacco Use  Smoking Status Some Days   Packs/day: 0.25   Years: 30.00   Total pack years: 7.50   Types: Cigarettes   Start date: 06/03/1976  Smokeless Tobacco Never  Tobacco Comments   smokes 3-4 per day again   Social History   Substance and Sexual Activity  Alcohol Use No   Alcohol/week: 0.0 standard drinks of alcohol   Social History   Substance and Sexual Activity  Drug Use No    Allergies: Allergies  Allergen Reactions   Aspirin  REACTION: GI UPSET    Medications: Current Outpatient Medications  Medication Sig Dispense Refill   amLODipine (NORVASC) 10 MG tablet Take 10 mg by mouth daily.     atorvastatin (LIPITOR) 10 MG tablet Take 10 mg by mouth daily.     carvedilol (COREG) 3.125 MG tablet Take 1 tablet (3.125 mg total) by mouth 2 (two) times daily. 180 tablet 1   docusate sodium (COLACE) 100 MG capsule Take 100 mg by mouth as needed.     furosemide (LASIX) 20 MG tablet Take 1 tablet (20 mg total) by mouth daily. 90 tablet 2    gabapentin (NEURONTIN) 300 MG capsule Take 300 mg by mouth 2 (two) times daily.     guaiFENesin-dextromethorphan (ROBITUSSIN DM) 100-10 MG/5ML syrup Take 5 mLs by mouth 3 (three) times daily as needed.     hydroxychloroquine (PLAQUENIL) 200 MG tablet TAKE 1 TABLET(200 MG) BY MOUTH TWICE DAILY 180 tablet 0   sacubitril-valsartan (ENTRESTO) 49-51 MG Take 1 tablet by mouth 2 (two) times daily. Start on Thursday 11/14/18 60 tablet 6   sertraline (ZOLOFT) 100 MG tablet Take 100 mg by mouth daily.     spironolactone (ALDACTONE) 25 MG tablet TAKE 1 TABLET(25 MG) BY MOUTH DAILY 90 tablet 0   dapagliflozin propanediol (FARXIGA) 10 MG TABS tablet Take 1 tablet (10 mg total) by mouth daily before breakfast. (Patient not taking: Reported on 06/08/2022) 30 tablet 6   No current facility-administered medications for this visit.    Review of Systems: GENERAL: negative for malaise, night sweats HEENT: No changes in hearing or vision, no nose bleeds or other nasal problems. NECK: Negative for lumps, goiter, pain and significant neck swelling RESPIRATORY: Negative for cough, wheezing CARDIOVASCULAR: Negative for chest pain, leg swelling, palpitations, orthopnea GI: SEE HPI MUSCULOSKELETAL: Negative for joint pain or swelling, back pain, and muscle pain. SKIN: Negative for lesions, rash PSYCH: Negative for sleep disturbance, mood disorder and recent psychosocial stressors. HEMATOLOGY Negative for prolonged bleeding, bruising easily, and swollen nodes. ENDOCRINE: Negative for cold or heat intolerance, polyuria, polydipsia and goiter. NEURO: negative for tremor, gait imbalance, syncope and seizures. The remainder of the review of systems is noncontributory.   Physical Exam: BP 135/83 (BP Location: Left Arm, Patient Position: Sitting, Cuff Size: Large)   Pulse 79   Temp 98 F (36.7 C) (Oral)   Ht '5\' 5"'$  (1.651 m)   Wt 240 lb 14.4 oz (109.3 kg)   BMI 40.09 kg/m  GENERAL: The patient is AO x3, in no acute  distress. Obese. HEENT: Head is normocephalic and atraumatic. EOMI are intact. Mouth is well hydrated and without lesions. NECK: Supple. No masses LUNGS: Clear to auscultation. No presence of rhonchi/wheezing/rales. Adequate chest expansion HEART: RRR, normal s1 and s2. ABDOMEN: Soft, nontender, no guarding, no peritoneal signs, and nondistended. BS +. No masses. EXTREMITIES: Without any cyanosis, clubbing, rash, lesions or edema. NEUROLOGIC: AOx3, no focal motor deficit. SKIN: no jaundice, no rashes   Imaging/Labs: as above  I personally reviewed and interpreted the available labs, imaging and endoscopic files.  Impression and Plan: Caroline Mason is a 66 y.o. female with PMH nonischemic cardiomyopathy, FBM, obesity, depression, RA, COPD,  HTN, who presents for evaluation of incomplete colonoscopy.  The patient had an incomplete colonoscopy 3 months ago.  I discussed with her the importance of having a thorough evaluation with a complete colonoscopy which she is agreeable to schedule.  She has also presented all her symptoms of abdominal discomfort and changes in  her bowel movements.  She has not presented any red flag signs and I considered these are likely related to IBS but we will look for other etiologies with celiac serologies, CRP, TSH, CBC and CMP.  For now she will benefit from continued low FODMAP diet and Bentyl as needed.  - Check celiac serologies, CRP, TSH, CBC and CMP.   - Schedule colonoscopy - Start Bentyl 1 tablet q12h as needed for abdominal pain - Patient was counseled about the benefit of implementing a low FODMAP to improve symptoms and recurrent episodes. A dietary list was provided to the patient. Also, the patient was counseled about the benefit of avoiding stressing situations and potential environmental triggers leading to symptomatology.  All questions were answered.      Maylon Peppers, MD Gastroenterology and Hepatology [facility]

## 2022-06-08 NOTE — Patient Instructions (Signed)
Perform blood workup Schedule colonoscopy Start Bentyl 1 tablet q12h as needed for abdominal pain Patient was counseled about the benefit of implementing a low FODMAP to improve symptoms and recurrent episodes. A dietary list was provided to the patient. Also, the patient was counseled about the benefit of avoiding stressing situations and potential environmental triggers leading to symptomatology.

## 2022-06-08 NOTE — Telephone Encounter (Signed)
Duilio Heritage Ann Brinn Westby, CMA  ?

## 2022-06-09 LAB — COMPREHENSIVE METABOLIC PANEL
AG Ratio: 1.3 (calc) (ref 1.0–2.5)
ALT: 8 U/L (ref 6–29)
AST: 12 U/L (ref 10–35)
Albumin: 4 g/dL (ref 3.6–5.1)
Alkaline phosphatase (APISO): 111 U/L (ref 37–153)
BUN: 10 mg/dL (ref 7–25)
CO2: 23 mmol/L (ref 20–32)
Calcium: 9 mg/dL (ref 8.6–10.4)
Chloride: 107 mmol/L (ref 98–110)
Creat: 0.65 mg/dL (ref 0.50–1.05)
Globulin: 3.2 g/dL (calc) (ref 1.9–3.7)
Glucose, Bld: 86 mg/dL (ref 65–99)
Potassium: 4 mmol/L (ref 3.5–5.3)
Sodium: 142 mmol/L (ref 135–146)
Total Bilirubin: 0.8 mg/dL (ref 0.2–1.2)
Total Protein: 7.2 g/dL (ref 6.1–8.1)

## 2022-06-09 LAB — CBC WITH DIFFERENTIAL/PLATELET
Absolute Monocytes: 449 cells/uL (ref 200–950)
Basophils Absolute: 28 cells/uL (ref 0–200)
Basophils Relative: 0.4 %
Eosinophils Absolute: 69 cells/uL (ref 15–500)
Eosinophils Relative: 1 %
HCT: 37.8 % (ref 35.0–45.0)
Hemoglobin: 12.8 g/dL (ref 11.7–15.5)
Lymphs Abs: 1815 cells/uL (ref 850–3900)
MCH: 28.4 pg (ref 27.0–33.0)
MCHC: 33.9 g/dL (ref 32.0–36.0)
MCV: 84 fL (ref 80.0–100.0)
MPV: 9.6 fL (ref 7.5–12.5)
Monocytes Relative: 6.5 %
Neutro Abs: 4540 cells/uL (ref 1500–7800)
Neutrophils Relative %: 65.8 %
Platelets: 267 10*3/uL (ref 140–400)
RBC: 4.5 10*6/uL (ref 3.80–5.10)
RDW: 12.9 % (ref 11.0–15.0)
Total Lymphocyte: 26.3 %
WBC: 6.9 10*3/uL (ref 3.8–10.8)

## 2022-06-09 LAB — CELIAC DISEASE PANEL
(tTG) Ab, IgA: <1 U/mL
(tTG) Ab, IgG: <1 U/mL
Gliadin IgA: <1 U/mL
Gliadin IgG: <1 U/mL
Immunoglobulin A: 325 mg/dL — ABNORMAL HIGH (ref 70–320)

## 2022-06-09 LAB — C-REACTIVE PROTEIN: CRP: 7.7 mg/L (ref <8.0)

## 2022-06-09 LAB — TSH: TSH: 1.47 mIU/L (ref 0.40–4.50)

## 2022-07-04 DIAGNOSIS — I10 Essential (primary) hypertension: Secondary | ICD-10-CM | POA: Diagnosis not present

## 2022-07-04 DIAGNOSIS — E7849 Other hyperlipidemia: Secondary | ICD-10-CM | POA: Diagnosis not present

## 2022-07-04 DIAGNOSIS — F17210 Nicotine dependence, cigarettes, uncomplicated: Secondary | ICD-10-CM | POA: Diagnosis not present

## 2022-07-04 DIAGNOSIS — Z0001 Encounter for general adult medical examination with abnormal findings: Secondary | ICD-10-CM | POA: Diagnosis not present

## 2022-07-04 DIAGNOSIS — N183 Chronic kidney disease, stage 3 unspecified: Secondary | ICD-10-CM | POA: Diagnosis not present

## 2022-07-21 ENCOUNTER — Encounter (HOSPITAL_COMMUNITY)
Admission: RE | Admit: 2022-07-21 | Discharge: 2022-07-21 | Disposition: A | Discharge status: Home / Self Care | Payer: Medicare Other | Source: Ambulatory Visit | Attending: Gastroenterology | Admitting: Gastroenterology

## 2022-07-21 ENCOUNTER — Encounter (HOSPITAL_COMMUNITY): Payer: Self-pay

## 2022-07-21 NOTE — Patient Instructions (Signed)
Caroline Mason  07/21/2022     '@PREFPERIOPPHARMACY'$ @   Your procedure is scheduled on 07/25/2022.  Report to [facility] at 6:50 A.M.  Call this number if you have problems the morning of surgery:  [phone]   Remember:  Follow the instructions for diet and prep given to you by Dr Colman Cater office.     Take these medicines the morning of surgery with A SIP OF WATER : Neurontin, Entresto, Zoloft,Coreg and Norvasc    Do not wear jewelry, make-up or nail polish.  Do not wear lotions, powders, or perfumes, or deodorant.  Do not shave 48 hours prior to surgery.  Men may shave face and neck.  Do not bring valuables to the hospital.  [facility] is not responsible for any belongings or valuables.  Contacts, dentures or bridgework may not be worn into surgery.  Leave your suitcase in the car.  After surgery it may be brought to your room.  For patients admitted to the hospital, discharge time will be determined by your treatment team.  Patients discharged the day of surgery will not be allowed to drive home.   Name and phone number of your driver:   Family Special instructions:  N/A  Please read over the following fact sheets that you were given.   Care and Recovery After SurgeryColonoscopy, Adult A colonoscopy is a procedure to look at the entire large intestine. This procedure is done using a long, thin, flexible tube that has a camera on the end. You may have a colonoscopy: As a part of normal colorectal screening. If you have certain symptoms, such as: A low number of red blood cells in your blood (anemia). Diarrhea that does not go away. Pain in your abdomen. Blood in your stool. A colonoscopy can help screen for and diagnose medical problems, including: An abnormal growth of cells or tissue (tumor). Abnormal growths within the lining of your intestine (polyps). Inflammation. Areas of bleeding. Tell your health care provider about: Any allergies you have. All  medicines you are taking, including vitamins, herbs, eye drops, creams, and over-the-counter medicines. Any problems you or family members have had with anesthetic medicines. Any bleeding problems you have. Any surgeries you have had. Any medical conditions you have. Any problems you have had with having bowel movements. Whether you are pregnant or may be pregnant. What are the risks? Generally, this is a safe procedure. However, problems may occur, including: Bleeding. Damage to your intestine. Allergic reactions to medicines given during the procedure. Infection. This is rare. What happens before the procedure? Eating and drinking restrictions Follow instructions from your health care provider about eating or drinking restrictions, which may include: A few days before the procedure: Follow a low-fiber diet. Avoid nuts, seeds, dried fruit, raw fruits, and vegetables. 1-3 days before the procedure: Eat only gelatin dessert or ice pops. Drink only clear liquids, such as water, clear juice, clear broth or bouillon, black coffee or tea, or clear soft drinks or sports drinks. Avoid liquids that contain red or purple dye. The day of the procedure: Do not eat solid foods. You may continue to drink clear liquids until up to 2 hours before the procedure. Do not eat or drink anything starting 2 hours before the procedure, or within the time period that your health care provider recommends. Bowel prep If you were prescribed a bowel prep to take by mouth (orally) to clean out your colon: Take it as told by your health care provider.  Starting the day before your procedure, you will need to drink a large amount of liquid medicine. The liquid will cause you to have many bowel movements of loose stool until your stool becomes almost clear or light green. If your skin or the opening between the buttocks (anus) gets irritated from diarrhea, you may relieve the irritation using: Wipes with medicine in  them, such as adult wet wipes with aloe and vitamin E. A product to soothe skin, such as petroleum jelly. If you vomit while drinking the bowel prep: Take a break for up to 60 minutes. Begin the bowel prep again. Call your health care provider if you keep vomiting or you cannot take the bowel prep without vomiting. To clean out your colon, you may also be given: Laxative medicines. These help you have a bowel movement. Instructions for enema use. An enema is liquid medicine injected into your rectum. Medicines Ask your health care provider about: Changing or stopping your regular medicines or supplements. This is especially important if you are taking iron supplements, diabetes medicines, or blood thinners. Taking medicines such as aspirin and ibuprofen. These medicines can thin your blood. Do not take these medicines unless your health care provider tells you to take them. Taking over-the-counter medicines, vitamins, herbs, and supplements. General instructions Ask your health care provider what steps will be taken to help prevent infection. These may include washing skin with a germ-killing soap. If you will be going home right after the procedure, plan to have a responsible adult: Take you home from the hospital or clinic. You will not be allowed to drive. Care for you for the time you are told. What happens during the procedure?  An IV will be inserted into one of your veins. You will be given a medicine to make you fall asleep (general anesthetic). You will lie on your side with your knees bent. A lubricant will be put on the tube. Then the tube will be: Inserted into your anus. Gently eased through all parts of your large intestine. Air will be sent into your colon to keep it open. This may cause some pressure or cramping. Images will be taken with the camera and will appear on a screen. A small tissue sample may be removed to be looked at under a microscope (biopsy). The tissue  may be sent to a lab for testing if any signs of problems are found. If small polyps are found, they may be removed and checked for cancer cells. When the procedure is finished, the tube will be removed. The procedure may vary among health care providers and hospitals. What happens after the procedure? Your blood pressure, heart rate, breathing rate, and blood oxygen level will be monitored until you leave the hospital or clinic. You may have a small amount of blood in your stool. You may pass gas and have mild cramping or bloating in your abdomen. This is caused by the air that was used to open your colon during the exam. If you were given a sedative during the procedure, it can affect you for several hours. Do not drive or operate machinery until your health care provider says that it is safe. It is up to you to get the results of your procedure. Ask your health care provider, or the department that is doing the procedure, when your results will be ready. Summary A colonoscopy is a procedure to look at the entire large intestine. Follow instructions from your health care provider about eating and  drinking before the procedure. If you were prescribed an oral bowel prep to clean out your colon, take it as told by your health care provider. During the colonoscopy, a flexible tube with a camera on its end is inserted into the anus and then passed into all parts of the large intestine. This information is not intended to replace advice given to you by your health care provider. Make sure you discuss any questions you have with your health care provider. Document Revised: 12/05/2021 Document Reviewed: 08/03/2021 Elsevier Patient Education  [place] Anesthesia refers to techniques, procedures, and medicines that help a person stay safe and comfortable during a medical or dental procedure. Monitored anesthesia care, or sedation, is one type of anesthesia. Your  anesthesia specialist may recommend sedation if you will be having a procedure that does not require you to be unconscious. You may have this procedure for: Cataract surgery. A dental procedure. A biopsy. A colonoscopy. During the procedure, you may receive a medicine to help you relax (sedative). There are three levels of sedation: Mild sedation. At this level, you may feel awake and relaxed. You will be able to follow directions. Moderate sedation. At this level, you will be sleepy. You may not remember the procedure. Deep sedation. At this level, you will be asleep. You will not remember the procedure. The more medicine you are given, the deeper your level of sedation will be. Depending on how you respond to the procedure, the anesthesia specialist may change your level of sedation or the type of anesthesia to fit your needs. An anesthesia specialist will monitor you closely during the procedure. Tell a health care provider about: Any allergies you have. All medicines you are taking, including vitamins, herbs, eye drops, creams, and over-the-counter medicines. Any problems you or family members have had with anesthetic medicines. Any blood disorders you have. Any surgeries you have had. Any medical conditions you have, such as sleep apnea. Whether you are pregnant or may be pregnant. Whether you use cigarettes, alcohol, or drugs. Any use of steroids, whether by mouth or as a cream. What are the risks? Generally, this is a safe procedure. However, problems may occur, including: Getting too much medicine (oversedation). Nausea. Allergic reaction to medicines. Trouble breathing. If this happens, a breathing tube may be used to help with breathing. It will be removed when you are awake and breathing on your own. Heart trouble. Lung trouble. Confusion that gets better with time (emergence delirium). What happens before the procedure? Staying hydrated Follow instructions from your health  care provider about hydration, which may include: Up to 2 hours before the procedure - you may continue to drink clear liquids, such as water, clear fruit juice, black coffee, and plain tea. Eating and drinking restrictions Follow instructions from your health care provider about eating and drinking, which may include: 8 hours before the procedure - stop eating heavy meals or foods, such as meat, fried foods, or fatty foods. 6 hours before the procedure - stop eating light meals or foods, such as toast or cereal. 6 hours before the procedure - stop drinking milk or drinks that contain milk. 2 hours before the procedure - stop drinking clear liquids. Medicines Ask your health care provider about: Changing or stopping your regular medicines. This is especially important if you are taking diabetes medicines or blood thinners. Taking medicines such as aspirin and ibuprofen. These medicines can thin your blood. Do not take these medicines unless your health  care provider tells you to take them. Taking over-the-counter medicines, vitamins, herbs, and supplements. Tests and exams You will have a physical exam. You may have blood tests done to show: How well your kidneys and liver are working. How well your blood can clot. General instructions Plan to have a responsible adult take you home from the hospital or clinic. If you will be going home right after the procedure, plan to have a responsible adult care for you for the time you are told. This is important. What happens during the procedure?  Your blood pressure, heart rate, breathing, level of pain, and overall condition will be monitored. An IV will be inserted into one of your veins. You will be given medicines as needed to keep you comfortable during the procedure. This may mean changing the level of sedation. Depending on your age or the procedure, the sedative may be given: As a pill that you will swallow or as a pill that is inserted  into the rectum. As an injection into the vein or muscle. As a spray through the nose. The procedure will be performed. Your breathing, heart rate, and blood pressure will be monitored during the procedure. When the procedure is over, the medicine will be stopped. The procedure may vary among health care providers and hospitals. What happens after the procedure? Your blood pressure, heart rate, breathing rate, and blood oxygen level will be monitored until you leave the hospital or clinic. You may feel sleepy, clumsy, or nauseous. You may feel forgetful about what happened after the procedure. You may vomit. You may continue to get IV fluids. Do not drive or operate machinery until your health care provider says that it is safe. Summary Monitored anesthesia care is used to keep a patient comfortable during short procedures. Tell your health care provider about any allergies or health conditions you have and about all the medicines you are taking. Before the procedure, follow instructions about when to stop eating and drinking and about changing or stopping any medicines. Your blood pressure, heart rate, breathing rate, and blood oxygen level will be monitored until you leave the hospital or clinic. Plan to have a responsible adult take you home from the hospital or clinic. This information is not intended to replace advice given to you by your health care provider. Make sure you discuss any questions you have with your health care provider. Document Revised: 11/15/2021 Document Reviewed: 11/13/2019 Elsevier Patient Education  [place].

## 2022-07-25 ENCOUNTER — Ambulatory Visit (HOSPITAL_COMMUNITY)
Admission: RE | Admit: 2022-07-25 | Discharge: 2022-07-25 | Disposition: A | Discharge status: Home / Self Care | Payer: Medicare Other | Attending: Gastroenterology | Admitting: Gastroenterology

## 2022-07-25 ENCOUNTER — Ambulatory Visit (HOSPITAL_BASED_OUTPATIENT_CLINIC_OR_DEPARTMENT_OTHER): Payer: Medicare Other | Admitting: Anesthesiology

## 2022-07-25 ENCOUNTER — Encounter (HOSPITAL_COMMUNITY)
Admission: RE | Disposition: A | Discharge status: Home / Self Care | Payer: Self-pay | Source: Home / Self Care | Attending: Gastroenterology

## 2022-07-25 ENCOUNTER — Ambulatory Visit (HOSPITAL_COMMUNITY): Payer: Medicare Other | Admitting: Anesthesiology

## 2022-07-25 ENCOUNTER — Encounter (HOSPITAL_COMMUNITY): Payer: Self-pay | Admitting: Gastroenterology

## 2022-07-25 DIAGNOSIS — K5730 Diverticulosis of large intestine without perforation or abscess without bleeding: Secondary | ICD-10-CM

## 2022-07-25 DIAGNOSIS — K635 Polyp of colon: Secondary | ICD-10-CM | POA: Diagnosis not present

## 2022-07-25 DIAGNOSIS — F17200 Nicotine dependence, unspecified, uncomplicated: Secondary | ICD-10-CM | POA: Diagnosis not present

## 2022-07-25 DIAGNOSIS — I10 Essential (primary) hypertension: Secondary | ICD-10-CM | POA: Diagnosis not present

## 2022-07-25 DIAGNOSIS — Z6839 Body mass index (BMI) 39.0-39.9, adult: Secondary | ICD-10-CM | POA: Diagnosis not present

## 2022-07-25 DIAGNOSIS — D120 Benign neoplasm of cecum: Secondary | ICD-10-CM | POA: Diagnosis not present

## 2022-07-25 DIAGNOSIS — J449 Chronic obstructive pulmonary disease, unspecified: Secondary | ICD-10-CM

## 2022-07-25 DIAGNOSIS — D124 Benign neoplasm of descending colon: Secondary | ICD-10-CM | POA: Insufficient documentation

## 2022-07-25 DIAGNOSIS — D123 Benign neoplasm of transverse colon: Secondary | ICD-10-CM | POA: Insufficient documentation

## 2022-07-25 DIAGNOSIS — G4730 Sleep apnea, unspecified: Secondary | ICD-10-CM | POA: Diagnosis not present

## 2022-07-25 DIAGNOSIS — Z1211 Encounter for screening for malignant neoplasm of colon: Secondary | ICD-10-CM | POA: Insufficient documentation

## 2022-07-25 DIAGNOSIS — F32A Depression, unspecified: Secondary | ICD-10-CM | POA: Diagnosis not present

## 2022-07-25 DIAGNOSIS — D122 Benign neoplasm of ascending colon: Secondary | ICD-10-CM | POA: Diagnosis not present

## 2022-07-25 DIAGNOSIS — F17210 Nicotine dependence, cigarettes, uncomplicated: Secondary | ICD-10-CM | POA: Diagnosis not present

## 2022-07-25 HISTORY — PX: COLONOSCOPY WITH PROPOFOL: SHX5780

## 2022-07-25 HISTORY — PX: POLYPECTOMY: SHX5525

## 2022-07-25 LAB — HM COLONOSCOPY

## 2022-07-25 SURGERY — COLONOSCOPY WITH PROPOFOL
Anesthesia: General

## 2022-07-25 MED ORDER — PROPOFOL 500 MG/50ML IV EMUL
INTRAVENOUS | Status: AC
  Filled 2022-07-25: qty 50

## 2022-07-25 MED ORDER — PROPOFOL 10 MG/ML IV BOLUS
INTRAVENOUS | Status: DC | PRN
  Administered 2022-07-25: 25 mg via INTRAVENOUS
  Administered 2022-07-25: 20 mg via INTRAVENOUS
  Administered 2022-07-25: 50 mg via INTRAVENOUS
  Administered 2022-07-25: 30 mg via INTRAVENOUS
  Administered 2022-07-25 (×2): 50 mg via INTRAVENOUS
  Administered 2022-07-25: 75 mg via INTRAVENOUS

## 2022-07-25 MED ORDER — PROPOFOL 1000 MG/100ML IV EMUL
INTRAVENOUS | Status: AC
  Filled 2022-07-25: qty 200

## 2022-07-25 MED ORDER — EPHEDRINE SULFATE (PRESSORS) 50 MG/ML IJ SOLN
INTRAMUSCULAR | Status: DC | PRN
  Administered 2022-07-25: 10 mg via INTRAVENOUS

## 2022-07-25 MED ORDER — LACTATED RINGERS IV SOLN: INTRAVENOUS | Status: DC | PRN

## 2022-07-25 MED ORDER — PROPOFOL 500 MG/50ML IV EMUL
INTRAVENOUS | Status: DC | PRN
  Administered 2022-07-25: 150 ug/kg/min via INTRAVENOUS

## 2022-07-25 MED ORDER — LIDOCAINE HCL 1 % IJ SOLN
INTRAMUSCULAR | Status: DC | PRN
  Administered 2022-07-25: 50 mg via INTRADERMAL

## 2022-07-25 NOTE — H&P (Signed)
Caroline Mason is an 66 y.o. female.   Chief Complaint: LLQ  pain, incomplete colonoscopy HPI: Caroline Mason is a 66 y.o. female with PMH nonischemic cardiomyopathy, FBM, obesity, depression, RA, COPD,  HTN, who presents for evaluation of incomplete colonoscopy and LLQ pain.  Patient reports some occasional episodes of pain in her left lower quadrant but this usually happens after she eats certain types of food or is constipated.  Denies any nausea, vomiting, fever, chills, changes in her weight, melena or hematochezia.  Past Medical History:  Diagnosis Date   Atypical pneumonia    12/11   Cardiomyopathy    Nonischemic, normal coronaries, LVEF 40-45%   COPD (chronic obstructive pulmonary disease) (HCC)    Depression    Essential hypertension    Fibromyalgia    Morbid obesity (HCC)    Obstructive sleep apnea    Rheumatoid arthritis(714.0)     Past Surgical History:  Procedure Laterality Date   CYST REMOVAL TRUNK     ROTATOR CUFF REPAIR Right    TUBAL LIGATION  1989   WRIST SURGERY  2016    Family History  Problem Relation Age of Onset   Stroke Mother    Hypertension Mother    Heart failure Sister 81   Breast cancer Sister    Heart failure Sister 66   Cancer Sister    Ovarian cancer Sister    Diabetes Maternal Grandmother    Diabetes Paternal Grandfather    Social History:  reports that she has been smoking cigarettes. She started smoking about 46 years ago. She has a 7.50 pack-year smoking history. She has never used smokeless tobacco. She reports that she does not drink alcohol and does not use drugs.  Allergies:  Allergies  Allergen Reactions   Aspirin     REACTION: GI UPSET    Medications Prior to Admission  Medication Sig Dispense Refill   amLODipine (NORVASC) 10 MG tablet Take 10 mg by mouth daily.     atorvastatin (LIPITOR) 10 MG tablet Take 10 mg by mouth daily.     carvedilol (COREG) 3.125 MG tablet Take 1 tablet (3.125 mg total) by mouth 2 (two)  times daily. 180 tablet 1   dicyclomine (BENTYL) 10 MG capsule Take 1 capsule (10 mg total) by mouth every 12 (twelve) hours as needed (abdominal pain). 60 capsule 2   docusate sodium (COLACE) 100 MG capsule Take 100 mg by mouth as needed.     furosemide (LASIX) 20 MG tablet Take 1 tablet (20 mg total) by mouth daily. 90 tablet 2   gabapentin (NEURONTIN) 300 MG capsule Take 300 mg by mouth 2 (two) times daily.     guaiFENesin-dextromethorphan (ROBITUSSIN DM) 100-10 MG/5ML syrup Take 5 mLs by mouth 3 (three) times daily as needed.     hydroxychloroquine (PLAQUENIL) 200 MG tablet TAKE 1 TABLET(200 MG) BY MOUTH TWICE DAILY 180 tablet 0   polyethylene glycol-electrolytes (TRILYTE) 420 g solution Take 4,000 mLs by mouth as directed. 4000 mL 0   sacubitril-valsartan (ENTRESTO) 49-51 MG Take 1 tablet by mouth 2 (two) times daily. Start on Thursday 11/14/18 60 tablet 6   sertraline (ZOLOFT) 100 MG tablet Take 100 mg by mouth daily.     spironolactone (ALDACTONE) 25 MG tablet TAKE 1 TABLET(25 MG) BY MOUTH DAILY 90 tablet 0   dapagliflozin propanediol (FARXIGA) 10 MG TABS tablet Take 1 tablet (10 mg total) by mouth daily before breakfast. (Patient not taking: Reported on 06/08/2022) 30 tablet 6  No results found for this or any previous visit (from the past 48 hour(s)). No results found.  Review of Systems  Constitutional: Negative.   HENT: Negative.    Eyes: Negative.   Respiratory: Negative.    Cardiovascular: Negative.   Gastrointestinal:  Positive for abdominal pain.  Endocrine: Negative.   Genitourinary: Negative.   Musculoskeletal: Negative.   Skin: Negative.   Allergic/Immunologic: Negative.   Neurological: Negative.   Hematological: Negative.   Psychiatric/Behavioral: Negative.      Blood pressure (!) 108/57, pulse 69, temperature 98.4 F (36.9 C), temperature source Oral, resp. rate 16, SpO2 97 %. Physical Exam  GENERAL: The patient is AO x3, in no acute distress. HEENT: Head is  normocephalic and atraumatic. EOMI are intact. Mouth is well hydrated and without lesions. NECK: Supple. No masses LUNGS: Clear to auscultation. No presence of rhonchi/wheezing/rales. Adequate chest expansion HEART: RRR, normal s1 and s2. ABDOMEN: Soft, nontender, no guarding, no peritoneal signs, and nondistended. BS +. No masses. EXTREMITIES: Without any cyanosis, clubbing, rash, lesions or edema. NEUROLOGIC: AOx3, no focal motor deficit. SKIN: no jaundice, no rashes  Assessment/Plan Caroline Mason is a 66 y.o. female with PMH nonischemic cardiomyopathy, FBM, obesity, depression, RA, COPD,  HTN, who presents for evaluation of incomplete colonoscopy and LLQ pain.we will proceed with colonoscopy.    Harvel Quale, MD 07/25/2022, 7:33 AM

## 2022-07-25 NOTE — Transfer of Care (Addendum)
Immediate Anesthesia Transfer of Care Note  Patient: Caroline Mason  Procedure(s) Performed: COLONOSCOPY WITH PROPOFOL POLYPECTOMY  Patient Location: Short Stay  Anesthesia Type:General  Level of Consciousness: awake  Airway & Oxygen Therapy: Patient Spontanous Breathing  Post-op Assessment: Report given to RN  Post vital signs: Reviewed  Last Vitals:  Vitals Value Taken Time  BP       96/51    Temp  97.6    Pulse  72    Resp  16    SpO2   98      Last Pain:  Vitals:   07/25/22 0703  TempSrc: Oral  PainSc: 0-No pain         Complications: No notable events documented.

## 2022-07-25 NOTE — Anesthesia Preprocedure Evaluation (Signed)
Anesthesia Evaluation  Patient identified by MRN, date of birth, ID band Patient awake    Reviewed: Allergy & Precautions, H&P , NPO status , Patient's Chart, lab work & pertinent test results, reviewed documented beta blocker date and time   Airway Mallampati: II  TM Distance: >3 FB Neck ROM: full    Dental no notable dental hx.    Pulmonary sleep apnea , COPD, Current Smoker,    Pulmonary exam normal breath sounds clear to auscultation       Cardiovascular Exercise Tolerance: Good hypertension, negative cardio ROS   Rhythm:regular Rate:Normal     Neuro/Psych PSYCHIATRIC DISORDERS Depression  Neuromuscular disease    GI/Hepatic negative GI ROS, Neg liver ROS,   Endo/Other  Morbid obesity  Renal/GU negative Renal ROS  negative genitourinary   Musculoskeletal   Abdominal   Peds  Hematology  (+) Blood dyscrasia, anemia ,   Anesthesia Other Findings   Reproductive/Obstetrics negative OB ROS                             Anesthesia Physical Anesthesia Plan  ASA: 3  Anesthesia Plan: General   Post-op Pain Management:    Induction:   PONV Risk Score and Plan: Propofol infusion  Airway Management Planned:   Additional Equipment:   Intra-op Plan:   Post-operative Plan:   Informed Consent: I have reviewed the patients History and Physical, chart, labs and discussed the procedure including the risks, benefits and alternatives for the proposed anesthesia with the patient or authorized representative who has indicated his/her understanding and acceptance.     Dental Advisory Given  Plan Discussed with: CRNA  Anesthesia Plan Comments:         Anesthesia Quick Evaluation

## 2022-07-25 NOTE — Anesthesia Postprocedure Evaluation (Signed)
Anesthesia Post Note  Patient: DICY SMIGEL  Procedure(s) Performed: COLONOSCOPY WITH PROPOFOL POLYPECTOMY  Patient location during evaluation: Short Stay Anesthesia Type: General Level of consciousness: awake and alert Pain management: pain level controlled Vital Signs Assessment: post-procedure vital signs reviewed and stable Respiratory status: spontaneous breathing Cardiovascular status: blood pressure returned to baseline and stable Postop Assessment: no apparent nausea or vomiting Anesthetic complications: no   No notable events documented.   Last Vitals:  Vitals:   07/25/22 0703 07/25/22 0839  BP: (!) 108/57 (!) 96/51  Pulse: 69 72  Resp: 16 16  Temp: 36.9 C 36.4 C  SpO2: 97% 98%    Last Pain:  Vitals:   07/25/22 0839  TempSrc: Oral  PainSc: 0-No pain                 Ruchama Kubicek

## 2022-07-25 NOTE — Op Note (Signed)
[facility] Patient Name: Caroline Mason Procedure Date: 07/25/2022 7:11 AM MRN: [id number] Date of Birth: 04-15-1956 Attending MD: Maylon Peppers ,  CSN: [id number] Age: 66 Admit Type: Outpatient Procedure:                Colonoscopy Indications:              Screening for colorectal malignant neoplasm, last                            colonoscopy incomplete (more recent than 10 years                            ago) Providers:                Maylon Peppers, Lurline Del, RN, Kristine L. Risa Grill, Technician Referring MD:              Medicines:                Monitored Anesthesia Care Complications:            No immediate complications. Estimated Blood Loss:     Estimated blood loss: none. Procedure:                Pre-Anesthesia Assessment:                           - Prior to the procedure, a History and Physical                            was performed, and patient medications, allergies                            and sensitivities were reviewed. The patient's                            tolerance of previous anesthesia was reviewed.                           - The risks and benefits of the procedure and the                            sedation options and risks were discussed with the                            patient. All questions were answered and informed                            consent was obtained.                           - ASA Grade Assessment: III - A patient with severe                            systemic disease.  After obtaining informed consent, the colonoscope                            was passed under direct vision. Throughout the                            procedure, the patient's blood pressure, pulse, and                            oxygen saturations were monitored continuously. The                            [id number]-HQ190L ([id number]) scope was introduced through                            the anus  and advanced to the the cecum, identified                            by appendiceal orifice and ileocecal valve. The                            colonoscopy was performed without difficulty. The                            patient tolerated the procedure well. The quality                            of the bowel preparation was good. Scope In: 7:56:09 AM Scope Out: 8:33:26 AM Scope Withdrawal Time: 0 hours 32 minutes 10 seconds  Total Procedure Duration: 0 hours 37 minutes 17 seconds  Findings:      The perianal and digital rectal examinations were normal.      Five sessile polyps were found in the transverse colon, ascending colon       and cecum. The polyps were 3 to 6 mm in size. These polyps were removed       with a cold snare. Resection and retrieval were complete.      Two sessile polyps were found in the ascending colon. The polyps were 2       mm in size. These polyps were removed with a cold biopsy forceps.       Resection and retrieval were complete.      Two sessile polyps were found in the descending colon. The polyps were 3       to 5 mm in size. These polyps were removed with a cold snare. Resection       and retrieval were complete (one not retrieved).      Multiple small and large-mouthed diverticula were found in the sigmoid       colon.      The retroflexed view of the distal rectum and anal verge was normal and       showed no anal or rectal abnormalities. Impression:               - Five 3 to 6 mm polyps in the transverse colon, in  the ascending colon and in the cecum, removed with                            a cold snare. Resected and retrieved.                           - Two 2 mm polyps in the ascending colon, removed                            with a cold biopsy forceps. Resected and retrieved.                           - Two 3 to 5 mm polyps in the descending colon,                            removed with a cold snare. Resected and  retrieved.                           - Diverticulosis in the sigmoid colon.                           - The distal rectum and anal verge are normal on                            retroflexion view. Moderate Sedation:      Per Anesthesia Care Recommendation:           - Discharge patient to home (ambulatory).                           - Resume previous diet.                           - Await pathology results.                           - Repeat colonoscopy for surveillance based on                            pathology results. Procedure Code(s):        --- Professional ---                           [phone], Colonoscopy, flexible; with removal of                            tumor(s), polyp(s), or other lesion(s) by snare                            technique                           60454, 59, Colonoscopy, flexible; with biopsy,                            single or multiple  Diagnosis Code(s):        --- Professional ---                           Z12.11, Encounter for screening for malignant                            neoplasm of colon                           K63.5, Polyp of colon                           K57.30, Diverticulosis of large intestine without                            perforation or abscess without bleeding CPT copyright 2019 American Medical Association. All rights reserved. The codes documented in this report are preliminary and upon coder review may  be revised to meet current compliance requirements. Maylon Peppers, MD Maylon Peppers,  07/25/2022 8:40:25 AM This report has been signed electronically. Number of Addenda: 0

## 2022-07-25 NOTE — Discharge Instructions (Signed)
You are being discharged to home.  Resume your previous diet.  We are waiting for your pathology results.  Your physician has recommended a repeat colonoscopy for surveillance based on pathology results.  

## 2022-07-26 ENCOUNTER — Encounter (INDEPENDENT_AMBULATORY_CARE_PROVIDER_SITE_OTHER): Payer: Self-pay | Admitting: *Deleted

## 2022-07-26 LAB — SURGICAL PATHOLOGY

## 2022-07-31 ENCOUNTER — Encounter (HOSPITAL_COMMUNITY): Payer: Self-pay | Admitting: Gastroenterology

## 2022-07-31 NOTE — Progress Notes (Deleted)
Office Visit Note  Patient: Caroline Mason             Date of Birth: 03/22/1956           MRN: [id number]             PCP: [facility] Referring: [facility]* Visit Date: 08/03/2022 Occupation: '@GUAROCC'$ @  Subjective:  No chief complaint on file.   History of Present Illness: Caroline Mason is a 66 y.o. female ***   Activities of Daily Living:  Patient reports morning stiffness for *** {minute/hour:19697}.   Patient {ACTIONS;DENIES/REPORTS:21021675::"Denies"} nocturnal pain.  Difficulty dressing/grooming: {ACTIONS;DENIES/REPORTS:21021675::"Denies"} Difficulty climbing stairs: {ACTIONS;DENIES/REPORTS:21021675::"Denies"} Difficulty getting out of chair: {ACTIONS;DENIES/REPORTS:21021675::"Denies"} Difficulty using hands for taps, buttons, cutlery, and/or writing: {ACTIONS;DENIES/REPORTS:21021675::"Denies"}  No Rheumatology ROS completed.   PMFS History:  Patient Active Problem List   Diagnosis Date Noted  . Abdominal pain, left lower quadrant 06/08/2022  . Loose stools 06/08/2022  . Unspecified rotator cuff tear or rupture of right shoulder, not specified as traumatic 02/15/2022  . Ganglion cyst of wrist, right 08/14/2018  . Trigger thumb, right thumb 03/27/2018  . High risk medication use 12/01/2016  . Rheumatoid arthritis with rheumatoid factor of multiple sites without organ or systems involvement ([place]) 12/01/2016  . Fibromyalgia 12/01/2016  . Primary osteoarthritis of both hands 12/01/2016  . Primary osteoarthritis of both knees 12/01/2016  . Primary osteoarthritis of both feet 12/01/2016  . Bradycardia 07/18/2011  . Chronic systolic heart failure ([place]) 06/05/2011  . ANEMIA 03/01/2011  . TOBACCO ABUSE 01/03/2011  . OBSTRUCTIVE SLEEP APNEA 01/03/2011  . ESSENTIAL HYPERTENSION, BENIGN 01/03/2011  . Secondary cardiomyopathy ([place]) 01/03/2011  . COPD 01/03/2011    Past Medical History:  Diagnosis Date  . Atypical pneumonia    12/11   . Cardiomyopathy    Nonischemic, normal coronaries, LVEF 40-45%  . COPD (chronic obstructive pulmonary disease) ([place])   . Depression   . Essential hypertension   . Fibromyalgia   . Morbid obesity ([place])   . Obstructive sleep apnea   . Rheumatoid arthritis(714.0)     Family History  Problem Relation Age of Onset  . Stroke Mother   . Hypertension Mother   . Heart failure Sister 75  . Breast cancer Sister   . Heart failure Sister 62  . Cancer Sister   . Ovarian cancer Sister   . Diabetes Maternal Grandmother   . Diabetes Paternal Grandfather    Past Surgical History:  Procedure Laterality Date  . CYST REMOVAL TRUNK    . ROTATOR CUFF REPAIR Right   . TUBAL LIGATION  1989  . WRIST SURGERY  2016   Social History   Social History Narrative   Lives in [place].   Immunization History  Administered Date(s) Administered  . Influenza-Unspecified 09/24/2014, 10/25/2018  . Moderna Sars-Covid-2 Vaccination 02/09/2020, 03/11/2020, 11/30/2020     Objective: Vital Signs: There were no vitals taken for this visit.   Physical Exam   Musculoskeletal Exam: ***  CDAI Exam: CDAI Score: -- Patient Global: --; Provider Global: -- Swollen: --; Tender: -- Joint Exam 08/03/2022   No joint exam has been documented for this visit   There is currently no information documented on the homunculus. Go to the Rheumatology activity and complete the homunculus joint exam.  Investigation: No additional findings.  Imaging: No results found.  Recent Labs: Lab Results  Component Value Date   WBC 6.9 06/08/2022   HGB 12.8 06/08/2022   PLT 267 06/08/2022   NA  142 06/08/2022   K 4.0 06/08/2022   CL 107 06/08/2022   CO2 23 06/08/2022   GLUCOSE 86 06/08/2022   BUN 10 06/08/2022   CREATININE 0.65 06/08/2022   BILITOT 0.8 06/08/2022   ALKPHOS 102 12/05/2016   AST 12 06/08/2022   ALT 8 06/08/2022   PROT 7.2 06/08/2022   ALBUMIN 4.0 12/05/2016   CALCIUM 9.0 06/08/2022   GFRAA 108  09/29/2020    Speciality Comments: PLQ Eye Exam:01/17/2022 WNL [facility] follow up 1 year  Procedures:  No procedures performed Allergies: Aspirin   Assessment / Plan:     Visit Diagnoses: No diagnosis found.  Orders: No orders of the defined types were placed in this encounter.  No orders of the defined types were placed in this encounter.   Face-to-face time spent with patient was *** minutes. Greater than 50% of time was spent in counseling and coordination of care.  Follow-Up Instructions: No follow-ups on file.   Earnestine Mealing, CMA  Note - This record has been created using Editor, commissioning.  Chart creation errors have been sought, but may not always  have been located. Such creation errors do not reflect on  the standard of medical care.

## 2022-08-03 ENCOUNTER — Ambulatory Visit: Payer: Medicare Other | Admitting: Physician Assistant

## 2022-08-03 DIAGNOSIS — M19071 Primary osteoarthritis, right ankle and foot: Secondary | ICD-10-CM

## 2022-08-03 DIAGNOSIS — M170 Bilateral primary osteoarthritis of knee: Secondary | ICD-10-CM

## 2022-08-03 DIAGNOSIS — Z8669 Personal history of other diseases of the nervous system and sense organs: Secondary | ICD-10-CM

## 2022-08-03 DIAGNOSIS — Z8709 Personal history of other diseases of the respiratory system: Secondary | ICD-10-CM

## 2022-08-03 DIAGNOSIS — Z79899 Other long term (current) drug therapy: Secondary | ICD-10-CM

## 2022-08-03 DIAGNOSIS — Z8639 Personal history of other endocrine, nutritional and metabolic disease: Secondary | ICD-10-CM

## 2022-08-03 DIAGNOSIS — M797 Fibromyalgia: Secondary | ICD-10-CM

## 2022-08-03 DIAGNOSIS — Z862 Personal history of diseases of the blood and blood-forming organs and certain disorders involving the immune mechanism: Secondary | ICD-10-CM

## 2022-08-03 DIAGNOSIS — G8929 Other chronic pain: Secondary | ICD-10-CM

## 2022-08-03 DIAGNOSIS — M19041 Primary osteoarthritis, right hand: Secondary | ICD-10-CM

## 2022-08-03 DIAGNOSIS — M0579 Rheumatoid arthritis with rheumatoid factor of multiple sites without organ or systems involvement: Secondary | ICD-10-CM

## 2022-08-07 NOTE — Progress Notes (Unsigned)
Office Visit Note  Patient: Caroline Mason             Date of Birth: 10/16/1956           MRN: [id number]             PCP: [facility] Referring: [facility]* Visit Date: 08/09/2022 Occupation: '@GUAROCC'$ @  Subjective:  No chief complaint on file.   History of Present Illness: Caroline Mason is a 66 y.o. female ***   Activities of Daily Living:  Patient reports morning stiffness for *** {minute/hour:19697}.   Patient {ACTIONS;DENIES/REPORTS:21021675::"Denies"} nocturnal pain.  Difficulty dressing/grooming: {ACTIONS;DENIES/REPORTS:21021675::"Denies"} Difficulty climbing stairs: {ACTIONS;DENIES/REPORTS:21021675::"Denies"} Difficulty getting out of chair: {ACTIONS;DENIES/REPORTS:21021675::"Denies"} Difficulty using hands for taps, buttons, cutlery, and/or writing: {ACTIONS;DENIES/REPORTS:21021675::"Denies"}  No Rheumatology ROS completed.   PMFS History:  Patient Active Problem List   Diagnosis Date Noted   Abdominal pain, left lower quadrant 06/08/2022   Loose stools 06/08/2022   Unspecified rotator cuff tear or rupture of right shoulder, not specified as traumatic 02/15/2022   Ganglion cyst of wrist, right 08/14/2018   Trigger thumb, right thumb 03/27/2018   High risk medication use 12/01/2016   Rheumatoid arthritis with rheumatoid factor of multiple sites without organ or systems involvement ([place]) 12/01/2016   Fibromyalgia 12/01/2016   Primary osteoarthritis of both hands 12/01/2016   Primary osteoarthritis of both knees 12/01/2016   Primary osteoarthritis of both feet 12/01/2016   Bradycardia 95/08/3266   Chronic systolic heart failure ([place]) 06/05/2011   ANEMIA 03/01/2011   TOBACCO ABUSE 01/03/2011   OBSTRUCTIVE SLEEP APNEA 01/03/2011   ESSENTIAL HYPERTENSION, BENIGN 01/03/2011   Secondary cardiomyopathy ([place]) 01/03/2011   COPD 01/03/2011    Past Medical History:  Diagnosis Date   Atypical pneumonia    12/11   Cardiomyopathy     Nonischemic, normal coronaries, LVEF 40-45%   COPD (chronic obstructive pulmonary disease) ([facility])    Depression    Essential hypertension    Fibromyalgia    Morbid obesity ([facility])    Obstructive sleep apnea    Rheumatoid arthritis(714.0)     Family History  Problem Relation Age of Onset   Stroke Mother    Hypertension Mother    Heart failure Sister 9   Breast cancer Sister    Heart failure Sister 53   Cancer Sister    Ovarian cancer Sister    Diabetes Maternal Grandmother    Diabetes Paternal Grandfather    Past Surgical History:  Procedure Laterality Date   COLONOSCOPY WITH PROPOFOL N/A 07/25/2022   Procedure: COLONOSCOPY WITH PROPOFOL;  Surgeon: Harvel Quale, MD;  [facility];  Service: Gastroenterology;  Laterality: N/A;  815   CYST REMOVAL TRUNK     POLYPECTOMY  07/25/2022   Procedure: POLYPECTOMY;  Surgeon: Harvel Quale, MD;  [facility]: [facility];  Service: Gastroenterology;;  cecal and ascending;descending   ROTATOR CUFF REPAIR Right    TUBAL LIGATION  1989   WRIST SURGERY  2016   Social History   Social History Narrative   Lives in [place].   Immunization History  Administered Date(s) Administered   Influenza-Unspecified 09/24/2014, 10/25/2018   Moderna Sars-Covid-2 Vaccination 02/09/2020, 03/11/2020, 11/30/2020     Objective: Vital Signs: There were no vitals taken for this visit.   Physical Exam   Musculoskeletal Exam: ***  CDAI Exam: CDAI Score: -- Patient Global: --; Provider Global: -- Swollen: --; Tender: -- Joint Exam 08/09/2022   No joint exam has been documented for this visit  There is currently no information documented on the homunculus. Go to the Rheumatology activity and complete the homunculus joint exam.  Investigation: No additional findings.  Imaging: No results found.  Recent Labs: Lab Results  Component Value Date   WBC 6.9 06/08/2022   HGB 12.8 06/08/2022   PLT 267 06/08/2022   NA  142 06/08/2022   K 4.0 06/08/2022   CL 107 06/08/2022   CO2 23 06/08/2022   GLUCOSE 86 06/08/2022   BUN 10 06/08/2022   CREATININE 0.65 06/08/2022   BILITOT 0.8 06/08/2022   ALKPHOS 102 12/05/2016   AST 12 06/08/2022   ALT 8 06/08/2022   PROT 7.2 06/08/2022   ALBUMIN 4.0 12/05/2016   CALCIUM 9.0 06/08/2022   GFRAA 108 09/29/2020    Speciality Comments: PLQ Eye Exam:01/17/2022 WNL [facility] follow up 1 year  Procedures:  No procedures performed Allergies: Aspirin   Assessment / Plan:     Visit Diagnoses: No diagnosis found.  Orders: No orders of the defined types were placed in this encounter.  No orders of the defined types were placed in this encounter.   Face-to-face time spent with patient was *** minutes. Greater than 50% of time was spent in counseling and coordination of care.  Follow-Up Instructions: No follow-ups on file.   Earnestine Mealing, CMA  Note - This record has been created using Editor, commissioning.  Chart creation errors have been sought, but may not always  have been located. Such creation errors do not reflect on  the standard of medical care.

## 2022-08-09 ENCOUNTER — Telehealth: Payer: Self-pay

## 2022-08-09 ENCOUNTER — Ambulatory Visit: Payer: Medicare Other | Attending: Physician Assistant | Admitting: Physician Assistant

## 2022-08-09 ENCOUNTER — Encounter: Payer: Self-pay | Admitting: Physician Assistant

## 2022-08-09 ENCOUNTER — Ambulatory Visit (INDEPENDENT_AMBULATORY_CARE_PROVIDER_SITE_OTHER): Payer: Medicare Other

## 2022-08-09 VITALS — BP 117/74 | HR 70 | Resp 17 | Ht 65.0 in | Wt 226.6 lb

## 2022-08-09 DIAGNOSIS — M797 Fibromyalgia: Secondary | ICD-10-CM | POA: Diagnosis not present

## 2022-08-09 DIAGNOSIS — M25511 Pain in right shoulder: Secondary | ICD-10-CM

## 2022-08-09 DIAGNOSIS — M19041 Primary osteoarthritis, right hand: Secondary | ICD-10-CM | POA: Diagnosis not present

## 2022-08-09 DIAGNOSIS — M25562 Pain in left knee: Secondary | ICD-10-CM | POA: Diagnosis not present

## 2022-08-09 DIAGNOSIS — M19042 Primary osteoarthritis, left hand: Secondary | ICD-10-CM

## 2022-08-09 DIAGNOSIS — Z862 Personal history of diseases of the blood and blood-forming organs and certain disorders involving the immune mechanism: Secondary | ICD-10-CM | POA: Diagnosis not present

## 2022-08-09 DIAGNOSIS — M170 Bilateral primary osteoarthritis of knee: Secondary | ICD-10-CM | POA: Diagnosis not present

## 2022-08-09 DIAGNOSIS — M0579 Rheumatoid arthritis with rheumatoid factor of multiple sites without organ or systems involvement: Secondary | ICD-10-CM

## 2022-08-09 DIAGNOSIS — Z8669 Personal history of other diseases of the nervous system and sense organs: Secondary | ICD-10-CM | POA: Diagnosis not present

## 2022-08-09 DIAGNOSIS — G8929 Other chronic pain: Secondary | ICD-10-CM | POA: Diagnosis not present

## 2022-08-09 DIAGNOSIS — M19071 Primary osteoarthritis, right ankle and foot: Secondary | ICD-10-CM | POA: Diagnosis not present

## 2022-08-09 DIAGNOSIS — Z8709 Personal history of other diseases of the respiratory system: Secondary | ICD-10-CM

## 2022-08-09 DIAGNOSIS — Z79899 Other long term (current) drug therapy: Secondary | ICD-10-CM

## 2022-08-09 DIAGNOSIS — Z8639 Personal history of other endocrine, nutritional and metabolic disease: Secondary | ICD-10-CM

## 2022-08-09 DIAGNOSIS — M19072 Primary osteoarthritis, left ankle and foot: Secondary | ICD-10-CM

## 2022-08-09 MED ORDER — HYDROXYCHLOROQUINE SULFATE 200 MG PO TABS: ORAL_TABLET | ORAL | 0 refills | Status: DC

## 2022-08-09 NOTE — Telephone Encounter (Signed)
Please apply for left knee visco, per Taylor Dale, PA-C. Thanks!  

## 2022-08-10 NOTE — Telephone Encounter (Signed)
VOB submitted for Synvisc, left knee BV pending

## 2022-08-10 NOTE — Progress Notes (Signed)
X-rays of the left knee are consistent with severe osteoarthritis and moderate to severe chondromalacia patella. Please notify the patient and apply for visco gel injections for the left knee.

## 2022-08-16 NOTE — Telephone Encounter (Signed)
Please call to schedule visco injections.  Approved for Synvisc, left knee(s). Buy & Bill Covered at 100% of injection costs after co-pay. Patient responsible for 20% of medication costs. $20 co-pay Deductibles do not apply No pre-certifications

## 2022-08-21 ENCOUNTER — Ambulatory Visit: Payer: Medicare Other | Attending: Physician Assistant | Admitting: Physician Assistant

## 2022-08-21 DIAGNOSIS — M1712 Unilateral primary osteoarthritis, left knee: Secondary | ICD-10-CM | POA: Diagnosis not present

## 2022-08-21 MED ORDER — LIDOCAINE HCL 1 % IJ SOLN
1.5000 mL | INTRAMUSCULAR | Status: AC | PRN
  Administered 2022-08-21: 1.5 mL

## 2022-08-21 MED ORDER — HYLAN G-F 20 16 MG/2ML IX SOSY
16.0000 mg | PREFILLED_SYRINGE | INTRA_ARTICULAR | Status: AC | PRN
  Administered 2022-08-21: 16 mg via INTRA_ARTICULAR

## 2022-08-21 NOTE — Progress Notes (Signed)
   Procedure Note  Patient: Caroline Mason             Date of Birth: 17-Jan-1956           MRN: [id number]             Visit Date: 08/21/2022  Procedures: Visit Diagnoses:  1. Primary osteoarthritis of left knee     Synvisc #1 left knee, B/B Large Joint Inj: L knee on 08/21/2022 1:59 PM Indications: pain Details: 25 G 1.5 in needle, medial approach  Arthrogram: No  Medications: 1.5 mL lidocaine 1 %; 16 mg hylan 16 MG/2ML Aspirate: 0 mL Outcome: tolerated well, no immediate complications Procedure, treatment alternatives, risks and benefits explained, specific risks discussed. Consent was given by the patient. Immediately prior to procedure a time out was called to verify the correct patient, procedure, equipment, support staff and site/side marked as required. Patient was prepped and draped in the usual sterile fashion.      Patient tolerated the procedure well.  Aftercare was discussed.  Hazel Sams, PA-C

## 2022-08-29 ENCOUNTER — Ambulatory Visit: Payer: Medicare Other | Attending: Physician Assistant | Admitting: Physician Assistant

## 2022-08-29 DIAGNOSIS — M1712 Unilateral primary osteoarthritis, left knee: Secondary | ICD-10-CM

## 2022-08-29 MED ORDER — LIDOCAINE HCL 1 % IJ SOLN
1.5000 mL | INTRAMUSCULAR | Status: AC | PRN
  Administered 2022-08-29: 1.5 mL

## 2022-08-29 MED ORDER — HYLAN G-F 20 16 MG/2ML IX SOSY
16.0000 mg | PREFILLED_SYRINGE | INTRA_ARTICULAR | Status: AC | PRN
  Administered 2022-08-29: 16 mg via INTRA_ARTICULAR

## 2022-08-29 NOTE — Progress Notes (Signed)
   Procedure Note  Patient: Caroline Mason             Date of Birth: 08/06/1956           MRN: [id number]             Visit Date: 08/29/2022  Procedures: Visit Diagnoses:  1. Primary osteoarthritis of left knee    Synvisc #2 left knee, B/B Large Joint Inj: L knee on 08/29/2022 1:12 PM Indications: pain Details: 25 G 1.5 in needle, medial approach  Arthrogram: No  Medications: 1.5 mL lidocaine 1 %; 16 mg hylan 16 MG/2ML Aspirate: 0 mL Outcome: tolerated well, no immediate complications Procedure, treatment alternatives, risks and benefits explained, specific risks discussed. Consent was given by the patient. Immediately prior to procedure a time out was called to verify the correct patient, procedure, equipment, support staff and site/side marked as required. Patient was prepped and draped in the usual sterile fashion.      Patient tolerated the procedure well.  Aftercare was discussed.  Hazel Sams, PA-C

## 2022-09-04 ENCOUNTER — Ambulatory Visit: Payer: Medicare Other | Attending: Physician Assistant | Admitting: Physician Assistant

## 2022-09-04 DIAGNOSIS — M1712 Unilateral primary osteoarthritis, left knee: Secondary | ICD-10-CM

## 2022-09-04 MED ORDER — HYLAN G-F 20 16 MG/2ML IX SOSY
16.0000 mg | PREFILLED_SYRINGE | INTRA_ARTICULAR | Status: AC | PRN
  Administered 2022-09-04: 16 mg via INTRA_ARTICULAR

## 2022-09-04 MED ORDER — LIDOCAINE HCL 1 % IJ SOLN
1.5000 mL | INTRAMUSCULAR | Status: AC | PRN
  Administered 2022-09-04: 1.5 mL

## 2022-09-04 NOTE — Progress Notes (Signed)
   Procedure Note  Patient: Caroline Mason             Date of Birth: October 31, 1956           MRN: [id number]             Visit Date: 09/04/2022  Procedures: Visit Diagnoses:  1. Primary osteoarthritis of left knee    Synvisc #3 left knee, B/B Large Joint Inj: L knee on 09/04/2022 2:21 PM Indications: pain Details: 25 G 1.5 in needle, lateral approach  Arthrogram: No  Medications: 1.5 mL lidocaine 1 %; 16 mg hylan 16 MG/2ML Aspirate: 0 mL Outcome: tolerated well, no immediate complications Procedure, treatment alternatives, risks and benefits explained, specific risks discussed. Consent was given by the patient. Immediately prior to procedure a time out was called to verify the correct patient, procedure, equipment, support staff and site/side marked as required. Patient was prepped and draped in the usual sterile fashion.     Patient tolerated the procedure well.  Aftercare was discussed. Hazel Sams, PA-C

## 2022-09-05 DIAGNOSIS — E559 Vitamin D deficiency, unspecified: Secondary | ICD-10-CM | POA: Diagnosis not present

## 2022-09-05 DIAGNOSIS — F17210 Nicotine dependence, cigarettes, uncomplicated: Secondary | ICD-10-CM | POA: Diagnosis not present

## 2022-09-05 DIAGNOSIS — I10 Essential (primary) hypertension: Secondary | ICD-10-CM | POA: Diagnosis not present

## 2022-09-05 DIAGNOSIS — D126 Benign neoplasm of colon, unspecified: Secondary | ICD-10-CM | POA: Diagnosis not present

## 2022-09-05 DIAGNOSIS — M0580 Other rheumatoid arthritis with rheumatoid factor of unspecified site: Secondary | ICD-10-CM | POA: Diagnosis not present

## 2022-09-05 DIAGNOSIS — I428 Other cardiomyopathies: Secondary | ICD-10-CM | POA: Diagnosis not present

## 2022-09-05 DIAGNOSIS — K5730 Diverticulosis of large intestine without perforation or abscess without bleeding: Secondary | ICD-10-CM | POA: Diagnosis not present

## 2022-09-05 DIAGNOSIS — E039 Hypothyroidism, unspecified: Secondary | ICD-10-CM | POA: Diagnosis not present

## 2022-09-05 DIAGNOSIS — I5042 Chronic combined systolic (congestive) and diastolic (congestive) heart failure: Secondary | ICD-10-CM | POA: Diagnosis not present

## 2022-09-05 DIAGNOSIS — E7849 Other hyperlipidemia: Secondary | ICD-10-CM | POA: Diagnosis not present

## 2022-09-05 DIAGNOSIS — M797 Fibromyalgia: Secondary | ICD-10-CM | POA: Diagnosis not present

## 2022-09-05 DIAGNOSIS — R7301 Impaired fasting glucose: Secondary | ICD-10-CM | POA: Diagnosis not present

## 2022-09-13 ENCOUNTER — Ambulatory Visit: Payer: Medicare Other | Admitting: Orthopaedic Surgery

## 2022-09-14 ENCOUNTER — Ambulatory Visit: Payer: Medicare Other | Admitting: Orthopaedic Surgery

## 2022-09-14 ENCOUNTER — Encounter: Payer: Self-pay | Admitting: Orthopaedic Surgery

## 2022-09-14 DIAGNOSIS — M654 Radial styloid tenosynovitis [de Quervain]: Secondary | ICD-10-CM | POA: Insufficient documentation

## 2022-09-14 NOTE — Progress Notes (Signed)
Office Visit Note   Patient: Caroline Mason           Date of Birth: 1956-11-20           MRN: [id number] Visit Date: 09/14/2022              Requested by: [facility], [place],   [id number] PCP: [facility]   Assessment & Plan: Visit Diagnoses:  1. De Quervain's disease (radial styloid tenosynovitis)     Plan: Caroline Mason appears to have de Quervain's tenosynovitis of the first dorsal extensor compartment left wrist.  I will apply a de Quervain's splint and have her try Voltaren gel.  Return in 3 to 4 weeks and if no improvement consider local cortisone injection  Follow-Up Instructions: Return in about 1 month (around 10/14/2022).   Orders:  No orders of the defined types were placed in this encounter.  No orders of the defined types were placed in this encounter.     Procedures: No procedures performed   Clinical Data: No additional findings.   Subjective: Chief Complaint  Patient presents with   Left Wrist - Pain  Patient presents today for left wrist pain. He said that she has pain along her thumb and radiates up her arm. She has constant pain, but gets worse with using that arm. She has been having that pain for two months.   HPI  Review of Systems   Objective: Vital Signs: There were no vitals taken for this visit.  Physical Exam Constitutional:      Appearance: She is well-developed.  Eyes:     Pupils: Pupils are equal, round, and reactive to light.  Pulmonary:     Effort: Pulmonary effort is normal.  Skin:    General: Skin is warm and dry.  Neurological:     Mental Status: She is alert and oriented to person, place, and time.  Psychiatric:        Behavior: Behavior normal.     Ortho Exam awake alert and oriented x3.  Comfortable sitting.  Left wrist with positive Finkelstein's test and pain along the first dorsal extensor compartment associated with some mild swelling.  Skin otherwise intact.   Neurologically intact  Specialty Comments:  No specialty comments available.  Imaging: No results found.   PMFS History: Patient Active Problem List   Diagnosis Date Noted   Tennis Must Quervain's disease (radial styloid tenosynovitis) 09/14/2022   Abdominal pain, left lower quadrant 06/08/2022   Loose stools 06/08/2022   Unspecified rotator cuff tear or rupture of right shoulder, not specified as traumatic 02/15/2022   Ganglion cyst of wrist, right 08/14/2018   Trigger thumb, right thumb 03/27/2018   High risk medication use 12/01/2016   Rheumatoid arthritis with rheumatoid factor of multiple sites without organ or systems involvement ([place]) 12/01/2016   Fibromyalgia 12/01/2016   Primary osteoarthritis of both hands 12/01/2016   Primary osteoarthritis of both knees 12/01/2016   Primary osteoarthritis of both feet 12/01/2016   Bradycardia 87/56/4332   Chronic systolic heart failure ([place]) 06/05/2011   ANEMIA 03/01/2011   TOBACCO ABUSE 01/03/2011   OBSTRUCTIVE SLEEP APNEA 01/03/2011   ESSENTIAL HYPERTENSION, BENIGN 01/03/2011   Secondary cardiomyopathy ([place]) 01/03/2011   COPD 01/03/2011   Past Medical History:  Diagnosis Date   Atypical pneumonia    12/11   Cardiomyopathy    Nonischemic, normal coronaries, LVEF 40-45%   COPD (chronic obstructive pulmonary disease) ([place])    Depression  Diverticulitis    per patient   Essential hypertension    Fibromyalgia    Morbid obesity ([facility])    Obstructive sleep apnea    Rheumatoid arthritis(714.0)     Family History  Problem Relation Age of Onset   Stroke Mother    Hypertension Mother    Heart failure Sister 29   Breast cancer Sister    Heart failure Sister 9   Cancer Sister    Ovarian cancer Sister    Diabetes Maternal Grandmother    Diabetes Paternal Grandfather     Past Surgical History:  Procedure Laterality Date   COLONOSCOPY WITH PROPOFOL N/A 07/25/2022   Procedure: COLONOSCOPY WITH PROPOFOL;  Surgeon: Harvel Quale, MD;  [facility];  Service: Gastroenterology;  Laterality: N/A;  815   CYST REMOVAL TRUNK     POLYPECTOMY  07/25/2022   Procedure: POLYPECTOMY;  Surgeon: Harvel Quale, MD;  [facility];  Service: Gastroenterology;;  cecal and ascending;descending   ROTATOR CUFF REPAIR Right 02/2022   TUBAL LIGATION  1989   WRIST SURGERY  2016   Social History   Occupational History   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

## 2022-09-25 ENCOUNTER — Other Ambulatory Visit: Payer: Self-pay | Admitting: Cardiology

## 2022-09-29 DIAGNOSIS — Z1231 Encounter for screening mammogram for malignant neoplasm of breast: Secondary | ICD-10-CM | POA: Diagnosis not present

## 2022-10-05 ENCOUNTER — Encounter: Payer: Self-pay | Admitting: Orthopaedic Surgery

## 2022-10-05 ENCOUNTER — Ambulatory Visit: Payer: Medicare Other | Admitting: Orthopaedic Surgery

## 2022-10-05 DIAGNOSIS — M654 Radial styloid tenosynovitis [de Quervain]: Secondary | ICD-10-CM | POA: Diagnosis not present

## 2022-10-05 MED ORDER — METHYLPREDNISOLONE ACETATE 40 MG/ML IJ SUSP
40.0000 mg | INTRAMUSCULAR | Status: AC | PRN
  Administered 2022-10-05: 40 mg

## 2022-10-05 MED ORDER — LIDOCAINE HCL 1 % IJ SOLN
1.0000 mL | INTRAMUSCULAR | Status: AC | PRN
  Administered 2022-10-05: 1 mL

## 2022-10-05 NOTE — Progress Notes (Signed)
Office Visit Note   Patient: Caroline Mason           Date of Birth: 04/02/1956           MRN: [id number] Visit Date: 10/05/2022              Requested by: Practice, [place],  [place] [id number] PCP: [facility]   Assessment & Plan: Visit Diagnoses:  1. De Quervain's disease (radial styloid tenosynovitis)     Plan: [name] is a pleasant 66 year old woman with a history of rheumatoid arthritis.  She was seen 3 weeks ago complaining of left radial wrist pain.  Exam was consistent with de Quervain's.  She was advised to wear a spica thumb splint and apply Voltaren gel.  She is not using the Voltaren gel.  She has not found any improvement.  A discussion was had at that time that if she did not get better certainly we could try a cortisone injection.  She would like to do that today.  Follow-Up Instructions: Return if symptoms worsen or fail to improve.   Orders:  Orders Placed This Encounter  Procedures   Hand/UE Inj   No orders of the defined types were placed in this encounter.     Procedures: Hand/UE Inj for de Quervain's tenosynovitis on 10/05/2022 4:02 PM Indications: tendon swelling Details: 27 G needle, lateral approach Medications: 1 mL lidocaine 1 %; 40 mg methylPREDNISolone acetate 40 MG/ML  After obtaining verbal consent the first dorsal compartment of the left wrist was prepped with alcohol followed by Betadine.  Tendon sheath was injected with 1 cc of lidocaine and 1 cc of Depo-Medrol 40 mg.  Patient tolerated the procedure well     Clinical Data: No additional findings.   Subjective: Chief Complaint  Patient presents with   Left Wrist - Follow-up  Patient presents today for a three week follow up on her left wrist. She has been using a splint. She is not using the Voltaren gel. No change since her last visit.     Review of Systems  All other systems reviewed and are negative.    Objective: Vital Signs:  There were no vitals taken for this visit.  Physical Exam Constitutional:      Appearance: Normal appearance.  Pulmonary:     Effort: Pulmonary effort is normal.  Neurological:     General: No focal deficit present.     Mental Status: She is alert.     Ortho Exam Examination of her left wrist.  She has no deformity.  She does have some mild soft tissue swelling but no erythema.  She is neurovascular intact she has a strong radial pulse she is focally tender over the first dorsal compartment of the left wrist.  Positive Finkelstein's test. Specialty Comments:  No specialty comments available.  Imaging: No results found.   PMFS History: Patient Active Problem List   Diagnosis Date Noted   Tennis Must Quervain's disease (radial styloid tenosynovitis) 09/14/2022   Abdominal pain, left lower quadrant 06/08/2022   Loose stools 06/08/2022   Unspecified rotator cuff tear or rupture of right shoulder, not specified as traumatic 02/15/2022   Ganglion cyst of wrist, right 08/14/2018   Trigger thumb, right thumb 03/27/2018   High risk medication use 12/01/2016   Rheumatoid arthritis with rheumatoid factor of multiple sites without organ or systems involvement ([place]) 12/01/2016   Fibromyalgia 12/01/2016   Primary osteoarthritis of both hands 12/01/2016  Primary osteoarthritis of both knees 12/01/2016   Primary osteoarthritis of both feet 12/01/2016   Bradycardia 60/03/5996   Chronic systolic heart failure ([place]) 06/05/2011   ANEMIA 03/01/2011   TOBACCO ABUSE 01/03/2011   OBSTRUCTIVE SLEEP APNEA 01/03/2011   ESSENTIAL HYPERTENSION, BENIGN 01/03/2011   Secondary cardiomyopathy ([place]) 01/03/2011   COPD 01/03/2011   Past Medical History:  Diagnosis Date   Atypical pneumonia    12/11   Cardiomyopathy    Nonischemic, normal coronaries, LVEF 40-45%   COPD (chronic obstructive pulmonary disease) ([place])    Depression    Diverticulitis    per patient   Essential hypertension    Fibromyalgia     Morbid obesity ([place])    Obstructive sleep apnea    Rheumatoid arthritis(714.0)     Family History  Problem Relation Age of Onset   Stroke Mother    Hypertension Mother    Heart failure Sister 28   Breast cancer Sister    Heart failure Sister 82   Cancer Sister    Ovarian cancer Sister    Diabetes Maternal Grandmother    Diabetes Paternal Grandfather     Past Surgical History:  Procedure Laterality Date   COLONOSCOPY WITH PROPOFOL N/A 07/25/2022   Procedure: COLONOSCOPY WITH PROPOFOL;  Surgeon: Harvel Quale, MD;  [facility];  Service: Gastroenterology;  Laterality: N/A;  815   CYST REMOVAL TRUNK     POLYPECTOMY  07/25/2022   Procedure: POLYPECTOMY;  Surgeon: Harvel Quale, MD;  Location: [facility];  Service: Gastroenterology;;  cecal and ascending;descending   ROTATOR CUFF REPAIR Right 02/2022   TUBAL LIGATION  1989   WRIST SURGERY  2016   Social History   Occupational History   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

## 2022-10-09 ENCOUNTER — Ambulatory Visit (INDEPENDENT_AMBULATORY_CARE_PROVIDER_SITE_OTHER): Payer: Medicare Other | Admitting: Gastroenterology

## 2022-10-09 ENCOUNTER — Encounter (INDEPENDENT_AMBULATORY_CARE_PROVIDER_SITE_OTHER): Payer: Self-pay | Admitting: Gastroenterology

## 2022-11-23 ENCOUNTER — Ambulatory Visit: Payer: Medicare Other | Attending: Cardiology

## 2022-11-23 DIAGNOSIS — I428 Other cardiomyopathies: Secondary | ICD-10-CM | POA: Diagnosis not present

## 2022-11-23 LAB — ECHOCARDIOGRAM COMPLETE
AR max vel: 2.2 cm2
AV Area VTI: 2.16 cm2
AV Area mean vel: 2.08 cm2
AV Mean grad: 4.2 mmHg
AV Peak grad: 7.6 mmHg
Ao pk vel: 1.38 m/s
Area-P 1/2: 3.74 cm2
Calc EF: 25.7 %
MV M vel: 3.17 m/s
MV Peak grad: 40.2 mmHg
S' Lateral: 4.7 cm
Single Plane A2C EF: 30.4 %
Single Plane A4C EF: 25.5 %

## 2022-11-28 ENCOUNTER — Ambulatory Visit: Payer: Medicare Other | Admitting: Cardiology

## 2022-11-28 NOTE — Progress Notes (Deleted)
Cardiology Office Note  Date: 11/28/2022   ID: Caroline Mason, DOB 08/10/1956, MRN [id number]  PCP:  [facility]  Cardiologist:  Rozann Lesches, MD Electrophysiologist:  None   No chief complaint on file.   History of Present Illness: Caroline Mason is a 66 y.o. female last seen in May.  Recent follow-up echocardiogram revealed LVEF 25 to 30% with global hypokinesis, severe left atrial enlargement with trivial mitral regurgitation.  This represents a decrease in LVEF in comparison to study from last year.  She had normal coronaries as of cardiac catheterization in 2012.  Past Medical History:  Diagnosis Date   Atypical pneumonia    12/11   Cardiomyopathy    Nonischemic, normal coronaries, LVEF 40-45%   COPD (chronic obstructive pulmonary disease) ([facility])    Depression    Diverticulitis    per patient   Essential hypertension    Fibromyalgia    Morbid obesity ([place])    Obstructive sleep apnea    Rheumatoid arthritis(714.0)     Past Surgical History:  Procedure Laterality Date   COLONOSCOPY WITH PROPOFOL N/A 07/25/2022   Procedure: COLONOSCOPY WITH PROPOFOL;  Surgeon: Harvel Quale, MD;  [facility];  Service: Gastroenterology;  Laterality: N/A;  815   CYST REMOVAL TRUNK     POLYPECTOMY  07/25/2022   Procedure: POLYPECTOMY;  Surgeon: Montez Morita, Quillian Quince, MD;  [facility];  Service: Gastroenterology;;  cecal and ascending;descending   ROTATOR CUFF REPAIR Right 02/2022   TUBAL LIGATION  1989   WRIST SURGERY  2016    Current Outpatient Medications  Medication Sig Dispense Refill   amLODipine (NORVASC) 10 MG tablet Take 10 mg by mouth daily.     atorvastatin (LIPITOR) 10 MG tablet Take 10 mg by mouth daily.     carvedilol (COREG) 3.125 MG tablet TAKE 1 TABLET(3.125 MG) BY MOUTH TWICE DAILY 180 tablet 1   dapagliflozin propanediol (FARXIGA) 10 MG TABS tablet Take 1 tablet (10 mg total) by mouth daily  before breakfast. 30 tablet 6   dicyclomine (BENTYL) 10 MG capsule Take 1 capsule (10 mg total) by mouth every 12 (twelve) hours as needed (abdominal pain). 60 capsule 2   docusate sodium (COLACE) 100 MG capsule Take 100 mg by mouth as needed.     furosemide (LASIX) 20 MG tablet Take 1 tablet (20 mg total) by mouth daily. 90 tablet 2   gabapentin (NEURONTIN) 300 MG capsule Take 300 mg by mouth 2 (two) times daily.     guaiFENesin-dextromethorphan (ROBITUSSIN DM) 100-10 MG/5ML syrup Take 5 mLs by mouth 3 (three) times daily as needed.     hydroxychloroquine (PLAQUENIL) 200 MG tablet TAKE 1 TABLET(200 MG) BY MOUTH TWICE DAILY 180 tablet 0   sacubitril-valsartan (ENTRESTO) 49-51 MG Take 1 tablet by mouth 2 (two) times daily. Start on Thursday 11/14/18 60 tablet 6   sertraline (ZOLOFT) 100 MG tablet Take 100 mg by mouth daily.     spironolactone (ALDACTONE) 25 MG tablet TAKE 1 TABLET(25 MG) BY MOUTH DAILY 90 tablet 0   No current facility-administered medications for this visit.   Allergies:  Aspirin   Social History: The patient  reports that she has been smoking cigarettes. She started smoking about 46 years ago. She has a 7.50 pack-year smoking history. She has been exposed to tobacco smoke. She has never used smokeless tobacco. She reports that she does not drink alcohol and does not use drugs.   Family History: The  patient's family history includes Breast cancer in her sister; Cancer in her sister; Diabetes in her maternal grandmother and paternal grandfather; Heart failure (age of onset: 32) in her sister and sister; Hypertension in her mother; Ovarian cancer in her sister; Stroke in her mother.   ROS:  Please see the history of present illness. Otherwise, complete review of systems is positive for {NONE DEFAULTED:18576}.  All other systems are reviewed and negative.   Physical Exam: VS:  There were no vitals taken for this visit., BMI There is no height or weight on file to calculate  BMI.  Wt Readings from Last 3 Encounters:  08/09/22 226 lb 9.6 oz (102.8 kg)  07/21/22 240 lb (108.9 kg)  06/08/22 240 lb 14.4 oz (109.3 kg)    General: Patient appears comfortable at rest. HEENT: Conjunctiva and lids normal, oropharynx clear with moist mucosa. Neck: Supple, no elevated JVP or carotid bruits, no thyromegaly. Lungs: Clear to auscultation, nonlabored breathing at rest. Cardiac: Regular rate and rhythm, no S3 or significant systolic murmur, no pericardial rub. Abdomen: Soft, nontender, no hepatomegaly, bowel sounds present, no guarding or rebound. Extremities: No pitting edema, distal pulses 2+. Skin: Warm and dry. Musculoskeletal: No kyphosis. Neuropsychiatric: Alert and oriented x3, affect grossly appropriate.  ECG:  An ECG dated 08/16/2021 was personally reviewed today and demonstrated:  Sinus rhythm with increased voltage and poor R wave progression.  Recent Labwork: 06/08/2022: ALT 8; AST 12; BUN 10; Creat 0.65; Hemoglobin 12.8; Platelets 267; Potassium 4.0; Sodium 142; TSH 1.47   Other Studies Reviewed Today:  Echocardiogram 11/23/2022:  1. Left ventricular ejection fraction, by estimation, is 25 to 30%. The  left ventricle has severely decreased function. The left ventricle  demonstrates global hypokinesis. Left ventricular diastolic parameters are  indeterminate. Elevated left atrial  pressure. The average left ventricular global longitudinal strain is -14.0  %. The global longitudinal strain is abnormal.   2. Right ventricular systolic function is normal. The right ventricular  size is normal.   3. Left atrial size was severely dilated.   4. The mitral valve is normal in structure. Trivial mitral valve  regurgitation. No evidence of mitral stenosis.   5. The tricuspid valve is abnormal.   6. The aortic valve has an indeterminant number of cusps. Aortic valve  regurgitation is not visualized. No aortic stenosis is present.   7. The inferior vena cava is  normal in size with greater than 50%  respiratory variability, suggesting right atrial pressure of 3 mmHg.   Assessment and Plan:    Medication Adjustments/Labs and Tests Ordered: Current medicines are reviewed at length with the patient today.  Concerns regarding medicines are outlined above.   Tests Ordered: No orders of the defined types were placed in this encounter.   Medication Changes: No orders of the defined types were placed in this encounter.   Disposition:  Follow up {follow up:15908}  Signed, Satira Sark, MD, [facility] 11/28/2022 10:11 AM    [facility] [place], [place], [place] [address] Phone: [phone]; Fax: [phone]

## 2022-12-01 ENCOUNTER — Telehealth: Payer: Self-pay | Admitting: *Deleted

## 2022-12-01 MED ORDER — ENTRESTO 49-51 MG PO TABS: 1.0000 | ORAL_TABLET | Freq: Two times a day (BID) | ORAL | 6 refills | Status: DC

## 2022-12-01 NOTE — Telephone Encounter (Signed)
-----   Message from Satira Sark, MD sent at 11/23/2022  1:05 PM EST ----- Results reviewed.  Follow-up echocardiogram shows significant deterioration in LVEF, now 25 to 30% range (previously 45 to 50%).  Please make sure that she has follow-up pending, her cardiac medications were fairly well-rounded after last visit.

## 2022-12-01 NOTE — Telephone Encounter (Signed)
Patient informed and says she has been off entresto due to not completing patient assistance. Entresto refill sent to [facility]. Verified that she is taking all other cardiac medications after review of medications while on phone. Follow up appointment rescheduled. Copy sent to PCP

## 2022-12-08 ENCOUNTER — Encounter: Payer: Self-pay | Admitting: Nurse Practitioner

## 2022-12-08 ENCOUNTER — Ambulatory Visit: Payer: Medicare Other | Attending: Nurse Practitioner | Admitting: Nurse Practitioner

## 2022-12-08 VITALS — BP 124/86 | HR 74 | Ht 65.0 in | Wt 227.8 lb

## 2022-12-08 DIAGNOSIS — Z79899 Other long term (current) drug therapy: Secondary | ICD-10-CM | POA: Diagnosis not present

## 2022-12-08 DIAGNOSIS — E669 Obesity, unspecified: Secondary | ICD-10-CM

## 2022-12-08 DIAGNOSIS — I5022 Chronic systolic (congestive) heart failure: Secondary | ICD-10-CM | POA: Diagnosis not present

## 2022-12-08 DIAGNOSIS — Z720 Tobacco use: Secondary | ICD-10-CM

## 2022-12-08 DIAGNOSIS — I5042 Chronic combined systolic (congestive) and diastolic (congestive) heart failure: Secondary | ICD-10-CM

## 2022-12-08 DIAGNOSIS — I10 Essential (primary) hypertension: Secondary | ICD-10-CM | POA: Diagnosis not present

## 2022-12-08 MED ORDER — NICOTINE 14 MG/24HR TD PT24: 14.0000 mg | MEDICATED_PATCH | Freq: Every day | TRANSDERMAL | 0 refills | Status: DC

## 2022-12-08 MED ORDER — NICOTINE 7 MG/24HR TD PT24: 7.0000 mg | MEDICATED_PATCH | Freq: Every day | TRANSDERMAL | 0 refills | Status: DC

## 2022-12-08 NOTE — Progress Notes (Unsigned)
Cardiology Office Note:    Date: 12/08/2022  ID:  Caroline Mason, DOB [birth year], MRN [id number]  PCP:  [facility] Cardiologist:  Rozann Lesches, MD     Referring MD: [facility]   CC: Here for CHF follow-up  History of Present Illness:    Caroline Mason is a 66 y.o. female with a hx of the following:  Hypertension Chronic systolic heart failure, secondary cardiomyopathy COPD OSA Rheumatoid arthritis De Quervain's disease Tobacco abuse Anemia Fibromyalgia Diverticulosis  Patient is a very pleasant 66 year old female with past medical history as mentioned above.  She has been following Dr. Domenic Polite since 2012-2013.  In 2013 she noted shortness of breath and echocardiogram was arranged.  EF at that time revealed 45 to 50%, grade 1 DD, with diffuse hypokinesis, mild left atrial enlargement.  This cardiomyopathy was found to be nonischemic as previous cardiac catheterization revealed normal coronary arteries.  She has been continued on GDMT and recommended to continue diet and exercise for management of nonischemic cardiomyopathy.  Last seen by Dr. Domenic Polite on May 24, 2022.  She reported dyspnea with typical activities, was not fluid up.  Dr. Domenic Polite reviewed her last echocardiogram at this office visit that was from September 2022 that revealed EF 45 to 50%, Dr. Domenic Polite discussed adding Wilder Glade to her medication regimen with patient at office visit that day.  Wilder Glade was initiated.  Follow-up echocardiogram was arranged and performed on November 23, 2022 that revealed reduced EF to 25 to 30%, global hypokinesis of left ventricle, severely dilated left atrial size, trivial MR, otherwise no other significant valvular abnormalities.  Today she presents for follow-up.  She states she is doing well.  She states she is aware of her recent echocardiogram results and she was informed by this by the nursing staff.  She  stated she stopped taking Entresto in the early part of summer due to not being able to afford Entresto, restarted it this past week.  She says she has had a lot going on in the last few months.  She was working part-time this summer, has had multiple surgeries.  Multiple injections for orthopedic issues as well as found out she has diverticulosis.  She says she has lost weight since June.  Per our records, she has lost a little over 10 pounds.  She says she stays pretty active.  Denies any chest pain, shortness of breath, swelling, palpitations, syncope, presyncope, dizziness, orthopnea, PND, acute bleeding, or claudication.  She is tolerating all her medications well.  Blood pressure is well-controlled at home.  She is requesting patient assistance regarding Delene Loll and [place].  She smokes about 6 cigarettes/day.  She is ready to quit smoking.  She denies any other questions or concerns today.   Past Medical History:  Diagnosis Date   Atypical pneumonia    12/11   Cardiomyopathy    Nonischemic, normal coronaries, LVEF 40-45% >> LVEF 25-30% 10/2022   COPD (chronic obstructive pulmonary disease) (HCC)    Depression    Diverticulitis    per patient   Diverticulosis    Essential hypertension    Fibromyalgia    Morbid obesity ([place])    Obstructive sleep apnea    Rheumatoid arthritis(714.0)     Past Surgical History:  Procedure Laterality Date   COLONOSCOPY WITH PROPOFOL N/A 07/25/2022   Procedure: COLONOSCOPY WITH PROPOFOL;  Surgeon: Harvel Quale, MD;  [facility];  Service: Gastroenterology;  Laterality: N/A;  [place]     POLYPECTOMY  07/25/2022   Procedure: POLYPECTOMY;  Surgeon: Montez Morita, Quillian Quince, MD;  Location: [facility];  Service: Gastroenterology;;  cecal and ascending;descending   ROTATOR CUFF REPAIR Right 02/2022   TUBAL LIGATION  1989   WRIST SURGERY  2016    Current Medications: Current Meds  Medication Sig   amLODipine  (NORVASC) 10 MG tablet Take 10 mg by mouth daily.   atorvastatin (LIPITOR) 10 MG tablet Take 10 mg by mouth daily.   carvedilol (COREG) 3.125 MG tablet TAKE 1 TABLET(3.125 MG) BY MOUTH TWICE DAILY   Cholecalciferol (VITAMIN D3 ADULT GUMMIES PO) Take 1 tablet by mouth daily.   dapagliflozin propanediol (FARXIGA) 10 MG TABS tablet Take 1 tablet (10 mg total) by mouth daily before breakfast.   dicyclomine (BENTYL) 10 MG capsule Take 1 capsule (10 mg total) by mouth every 12 (twelve) hours as needed (abdominal pain).   docusate sodium (COLACE) 100 MG capsule Take 100 mg by mouth as needed.   ferrous sulfate 324 MG TBEC Take 324 mg by mouth daily.   furosemide (LASIX) 20 MG tablet Take 1 tablet (20 mg total) by mouth daily.   gabapentin (NEURONTIN) 300 MG capsule Take 300 mg by mouth 2 (two) times daily.   guaiFENesin-dextromethorphan (ROBITUSSIN DM) 100-10 MG/5ML syrup Take 5 mLs by mouth 3 (three) times daily as needed.   hydroxychloroquine (PLAQUENIL) 200 MG tablet TAKE 1 TABLET(200 MG) BY MOUTH TWICE DAILY   nicotine (NICODERM CQ - DOSED IN MG/24 HOURS) 14 mg/24hr patch Place 1 patch (14 mg total) onto the skin daily for 14 days.   nicotine (NICODERM CQ - DOSED IN MG/24 HR) 7 mg/24hr patch Place 1 patch (7 mg total) onto the skin daily.   sacubitril-valsartan (ENTRESTO) 49-51 MG Take 1 tablet by mouth 2 (two) times daily. Start on Thursday 11/14/18   sertraline (ZOLOFT) 100 MG tablet Take 100 mg by mouth daily.   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   Social Determinants of Health   Financial Resource Strain: Not on file  Food Insecurity: Not on file  Transportation Needs: Not on file  Physical Activity: Not on file  Stress: Not on file  Social Connections: Not on file     Family History: The patient's family history includes Breast cancer in her  sister; Cancer in her sister; Diabetes in her maternal grandmother and paternal grandfather; Heart failure (age of onset: 52) in her sister and sister; Hypertension in her mother; Ovarian cancer in her sister; Stroke in her mother.  ROS:   Review of Systems  Constitutional:  Positive for weight loss. Negative for chills, diaphoresis, fever and malaise/fatigue.  HENT: Negative.    Eyes: Negative.   Respiratory: Negative.    Cardiovascular: Negative.   Gastrointestinal: Negative.   Genitourinary: Negative.   Musculoskeletal: Negative.   Skin: Negative.   Neurological: Negative.   Endo/Heme/Allergies: Negative.   Psychiatric/Behavioral: Negative.      Please see the history of present illness.    All other systems reviewed and are negative.  EKGs/Labs/Other Studies Reviewed:    The following studies were reviewed today:   EKG:  EKG is ordered today.  The ekg ordered today demonstrates NSR, 73 bpm, with PVC, otherwise nothing acute.    Echocardiogram on November 23, 2022:  1. Left ventricular ejection fraction, by estimation, is 25 to 30%. The  left ventricle has severely decreased function. The left ventricle   demonstrates global hypokinesis. Left ventricular diastolic parameters are  indeterminate. Elevated left atrial  pressure. The average left ventricular global longitudinal strain is -14.0  %. The global longitudinal strain is abnormal.   2. Right ventricular systolic function is normal. The right ventricular  size is normal.   3. Left atrial size was severely dilated.   4. The mitral valve is normal in structure. Trivial mitral valve  regurgitation. No evidence of mitral stenosis.   5. The tricuspid valve is abnormal.   6. The aortic valve has an indeterminant number of cusps. Aortic valve  regurgitation is not visualized. No aortic stenosis is present.   7. The inferior vena cava is normal in size with greater than 50%  respiratory variability, suggesting right atrial pressure of 3 mmHg.   Comparison(s): Echocardiogram done 09/08/21 showed an EF of 45-50%.   Recent Labs: 06/08/2022: ALT 8; BUN 10; Creat 0.65; Hemoglobin 12.8; Platelets 267; Potassium 4.0; Sodium 142; TSH 1.47  Recent Lipid Panel No results found for: "CHOL", "TRIG", "HDL", "CHOLHDL", "VLDL", "LDLCALC", "LDLDIRECT"   Physical Exam:    VS:  BP 124/86   Pulse 74   Ht '5\' 5"'$  (1.651 m)   Wt 227 lb 12.8 oz (103.3 kg)   SpO2 95%   BMI 37.91 kg/m     Wt Readings from Last 3 Encounters:  12/08/22 227 lb 12.8 oz (103.3 kg)  08/09/22 226 lb 9.6 oz (102.8 kg)  07/21/22 240 lb (108.9 kg)     GEN: Obese, 66 y.o. female in no acute distress HEENT: Normal NECK: No JVD; No carotid bruits CARDIAC: S1/S2, RRR, no murmurs, rubs, gallops; 2+ peripheral pulses throughout, strong and equal bilaterally RESPIRATORY:  Clear to auscultation without rales, wheezing or rhonchi  ABDOMEN: Soft, non-tender, non-distended MUSCULOSKELETAL:  No edema; No deformity  SKIN: Warm and dry NEUROLOGIC:  Alert and oriented x 3 PSYCHIATRIC:  Normal affect   ASSESSMENT:    1. Chronic systolic heart failure ([place])   2. Medication management    3. Essential hypertension   4. Obesity (BMI 30-39.9)   5. Tobacco abuse    PLAN:    In order of problems listed above:  Chronic systolic CHF with reduction in EF, HFrEF, medication management Most recent TTE revealed reduced EF from 45 to 50% to 25 to 30%.  This is most likely due to stopping Delene Loll  for a while since last office visit.  She has recently restarted back on Entresto.  Will obtain BMET per protocol. Euvolemic and well compensated on exam.  Continue carvedilol, Farxiga, Lasix, Entresto, and Aldactone. At next office visit, plan to uptitrate Entresto/Aldactone. Low sodium diet, fluid restriction <2L, and daily weights encouraged. Educated to contact our office for weight gain of 2 lbs overnight or 5 lbs in one week. Heart healthy diet and regular cardiovascular exercise encouraged. Will provide patient assistance for cost of Entresto and Farxiga.   HTN BP today 124/86. BP well controlled at home.  Continue current medication regimen. Discussed to monitor BP at home at least 2 hours after medications and sitting for 5-10 minutes. Heart healthy diet and regular cardiovascular exercise encouraged.   Obesity BMI today 37.91.  Per records, she is lost a little over 10 pounds since last office visit.  I congratulated her. Heart healthy diet and regular cardiovascular exercise encouraged. Weight loss via diet and exercise encouraged. Discussed the impact being overweight would have on cardiovascular risk.  4. Tobacco abuse Smoking cessation encouraged and discussed.  Will initiate 14 mg Nicotine patch daily for 6 weeks, then 7 mg Nicotine patch daily x 2 weeks per protocol when she is ready to quit smoking.   5. Disposition: Follow-up with me in 6 weeks or sooner if anything changes.   Medication Adjustments/Labs and Tests Ordered: Current medicines are reviewed at length with the patient today.  Concerns regarding medicines are outlined above.  Orders Placed This Encounter   Procedures   Basic metabolic panel   EKG 34-LPFX   Meds ordered this encounter  Medications   nicotine (NICODERM CQ - DOSED IN MG/24 HOURS) 14 mg/24hr patch    Sig: Place 1 patch (14 mg total) onto the skin daily for 14 days.    Dispense:  14 patch    Refill:  0   nicotine (NICODERM CQ - DOSED IN MG/24 HR) 7 mg/24hr patch    Sig: Place 1 patch (7 mg total) onto the skin daily.    Dispense:  28 patch    Refill:  0    Patient Instructions  Medication Instructions:  Nicotine patch - as directed Continue all current medications.  Labwork: none  Testing/Procedures: BMET - order give today Office will contact with results via phone, letter or mychart.     Follow-Up: 6  weeks  Any Other Special Instructions Will Be Listed Below (If Applicable).   If you need a refill on your cardiac medications before your next appointment, please call your pharmacy.    SignedFinis Bud, NP  12/09/2022 11:54 AM    [place]

## 2022-12-08 NOTE — Patient Instructions (Addendum)
Medication Instructions:  Nicotine patch - as directed Continue all current medications.  Labwork: none  Testing/Procedures: BMET - order give today Office will contact with results via phone, letter or mychart.     Follow-Up: 6  weeks  Any Other Special Instructions Will Be Listed Below (If Applicable).   If you need a refill on your cardiac medications before your next appointment, please call your pharmacy.

## 2022-12-09 ENCOUNTER — Encounter: Payer: Self-pay | Admitting: Nurse Practitioner

## 2022-12-14 ENCOUNTER — Other Ambulatory Visit: Payer: Self-pay | Admitting: *Deleted

## 2022-12-14 DIAGNOSIS — Z79899 Other long term (current) drug therapy: Secondary | ICD-10-CM | POA: Diagnosis not present

## 2022-12-14 MED ORDER — NICOTINE 14 MG/24HR TD PT24: 14.0000 mg | MEDICATED_PATCH | Freq: Every day | TRANSDERMAL | 0 refills | Status: AC

## 2022-12-14 NOTE — Progress Notes (Signed)
Done

## 2022-12-26 DIAGNOSIS — M19031 Primary osteoarthritis, right wrist: Secondary | ICD-10-CM | POA: Diagnosis not present

## 2022-12-26 DIAGNOSIS — R030 Elevated blood-pressure reading, without diagnosis of hypertension: Secondary | ICD-10-CM | POA: Diagnosis not present

## 2022-12-26 DIAGNOSIS — M0580 Other rheumatoid arthritis with rheumatoid factor of unspecified site: Secondary | ICD-10-CM | POA: Diagnosis not present

## 2023-01-02 NOTE — Progress Notes (Deleted)
Office Visit Note  Patient: Caroline Mason             Date of Birth: 09/22/1956           MRN: [id number]             PCP: [facility] Referring: [facility] Visit Date: 01/16/2023 Occupation: @GUAROCC$ @  Subjective:  No chief complaint on file.   History of Present Illness: Caroline Mason is a 67 y.o. female ***     Activities of Daily Living:  Patient reports morning stiffness for *** {minute/hour:19697}.   Patient {ACTIONS;DENIES/REPORTS:21021675::"Denies"} nocturnal pain.  Difficulty dressing/grooming: {ACTIONS;DENIES/REPORTS:21021675::"Denies"} Difficulty climbing stairs: {ACTIONS;DENIES/REPORTS:21021675::"Denies"} Difficulty getting out of chair: {ACTIONS;DENIES/REPORTS:21021675::"Denies"} Difficulty using hands for taps, buttons, cutlery, and/or writing: {ACTIONS;DENIES/REPORTS:21021675::"Denies"}  No Rheumatology ROS completed.   PMFS History:  Patient Active Problem List   Diagnosis Date Noted   Tennis Must Quervain's disease (radial styloid tenosynovitis) 09/14/2022   Abdominal pain, left lower quadrant 06/08/2022   Loose stools 06/08/2022   Unspecified rotator cuff tear or rupture of right shoulder, not specified as traumatic 02/15/2022   Ganglion cyst of wrist, right 08/14/2018   Trigger thumb, right thumb 03/27/2018   High risk medication use 12/01/2016   Rheumatoid arthritis with rheumatoid factor of multiple sites without organ or systems involvement ([place]) 12/01/2016   Fibromyalgia 12/01/2016   Primary osteoarthritis of both hands 12/01/2016   Primary osteoarthritis of both knees 12/01/2016   Primary osteoarthritis of both feet 12/01/2016   Bradycardia [id number]   Chronic systolic heart failure ([place]) 06/05/2011   ANEMIA 03/01/2011   TOBACCO ABUSE 01/03/2011   OBSTRUCTIVE SLEEP APNEA 01/03/2011   ESSENTIAL HYPERTENSION, BENIGN 01/03/2011   Secondary cardiomyopathy ([place]) 01/03/2011   COPD 01/03/2011    Past Medical History:   Diagnosis Date   Atypical pneumonia    12/11   Cardiomyopathy    Nonischemic, normal coronaries, LVEF 40-45% >> LVEF 25-30% 10/2022   COPD (chronic obstructive pulmonary disease) ([place])    Depression    Diverticulitis    per patient   Diverticulosis    Essential hypertension    Fibromyalgia    Morbid obesity ([place])    Obstructive sleep apnea    Rheumatoid arthritis(714.0)     Family History  Problem Relation Age of Onset   Stroke Mother    Hypertension Mother    Heart failure Sister 66   Breast cancer Sister    Heart failure Sister 50   Cancer Sister    Ovarian cancer Sister    Diabetes Maternal Grandmother    Diabetes Paternal Grandfather    Past Surgical History:  Procedure Laterality Date   COLONOSCOPY WITH PROPOFOL N/A 07/25/2022   Procedure: COLONOSCOPY WITH PROPOFOL;  Surgeon: Harvel Quale, MD;  [facility];  Service: Gastroenterology;  Laterality: N/A;  815   CYST REMOVAL TRUNK     POLYPECTOMY  07/25/2022   Procedure: POLYPECTOMY;  Surgeon: Harvel Quale, MD;  [facility];  Service: Gastroenterology;;  cecal and ascending;descending   ROTATOR CUFF REPAIR Right 02/2022   TUBAL LIGATION  1989   WRIST SURGERY  2016   Social History   Social History Narrative   Lives in [place].   Immunization History  Administered Date(s) Administered   Influenza-Unspecified 09/24/2014, 10/25/2018   Moderna Sars-Covid-2 Vaccination 02/09/2020, 03/11/2020, 11/30/2020     Objective: Vital Signs: There were no vitals taken for this visit.   Physical Exam   Musculoskeletal Exam: ***  CDAI Exam:  CDAI Score: -- Patient Global: --; Provider Global: -- Swollen: --; Tender: -- Joint Exam 01/16/2023   No joint exam has been documented for this visit   There is currently no information documented on the homunculus. Go to the Rheumatology activity and complete the homunculus joint exam.  Investigation: No additional  findings.  Imaging: No results found.  Recent Labs: Lab Results  Component Value Date   WBC 6.9 06/08/2022   HGB 12.8 06/08/2022   PLT 267 06/08/2022   NA 142 06/08/2022   K 4.0 06/08/2022   CL 107 06/08/2022   CO2 23 06/08/2022   GLUCOSE 86 06/08/2022   BUN 10 06/08/2022   CREATININE 0.65 06/08/2022   BILITOT 0.8 06/08/2022   ALKPHOS 102 12/05/2016   AST 12 06/08/2022   ALT 8 06/08/2022   PROT 7.2 06/08/2022   ALBUMIN 4.0 12/05/2016   CALCIUM 9.0 06/08/2022   GFRAA 108 09/29/2020    Speciality Comments: PLQ Eye Exam:01/17/2022 [facility] follow up 1 year  Procedures:  No procedures performed Allergies: Aspirin   Assessment / Plan:     Visit Diagnoses: No diagnosis found.  Orders: No orders of the defined types were placed in this encounter.  No orders of the defined types were placed in this encounter.   Face-to-face time spent with patient was *** minutes. Greater than 50% of time was spent in counseling and coordination of care.  Follow-Up Instructions: No follow-ups on file.   Earnestine Mealing, CMA  Note - This record has been created using Editor, commissioning.  Chart creation errors have been sought, but may not always  have been located. Such creation errors do not reflect on  the standard of medical care.

## 2023-01-03 ENCOUNTER — Other Ambulatory Visit (INDEPENDENT_AMBULATORY_CARE_PROVIDER_SITE_OTHER): Payer: Self-pay | Admitting: Gastroenterology

## 2023-01-03 DIAGNOSIS — R195 Other fecal abnormalities: Secondary | ICD-10-CM

## 2023-01-03 DIAGNOSIS — R1032 Left lower quadrant pain: Secondary | ICD-10-CM

## 2023-01-08 ENCOUNTER — Ambulatory Visit: Payer: Medicare Other | Attending: Nurse Practitioner | Admitting: Nurse Practitioner

## 2023-01-08 ENCOUNTER — Encounter: Payer: Self-pay | Admitting: Nurse Practitioner

## 2023-01-08 VITALS — BP 114/72 | HR 63 | Ht 65.5 in | Wt 228.6 lb

## 2023-01-08 DIAGNOSIS — Z720 Tobacco use: Secondary | ICD-10-CM

## 2023-01-08 DIAGNOSIS — I5020 Unspecified systolic (congestive) heart failure: Secondary | ICD-10-CM | POA: Diagnosis not present

## 2023-01-08 DIAGNOSIS — I10 Essential (primary) hypertension: Secondary | ICD-10-CM | POA: Diagnosis not present

## 2023-01-08 DIAGNOSIS — Z79899 Other long term (current) drug therapy: Secondary | ICD-10-CM

## 2023-01-08 DIAGNOSIS — E669 Obesity, unspecified: Secondary | ICD-10-CM | POA: Diagnosis not present

## 2023-01-08 NOTE — Progress Notes (Signed)
Cardiology Office Note:    Date: 01/08/2023  ID:  Caroline Mason, DOB 07/17/1956, MRN [id number]  PCP:  [facility] Cardiologist:  Rozann Lesches, MD     Referring MD: [facility]   CC: Here for CHF follow-up  History of Present Illness:    Caroline Mason is a 67 y.o. female with a hx of the following:  Hypertension Chronic systolic heart failure, secondary cardiomyopathy COPD OSA Rheumatoid arthritis De Quervain's disease Tobacco abuse Anemia Fibromyalgia Diverticulosis  Patient is a very pleasant 67 year old female with past medical history as mentioned above.  She has been following Dr. Domenic Polite since 2012-2013.  In 2013 she noted shortness of breath and echocardiogram was arranged.  EF at that time revealed 45 to 50%, grade 1 DD, with diffuse hypokinesis, mild left atrial enlargement.  This cardiomyopathy was found to be nonischemic as previous cardiac catheterization revealed normal coronary arteries.  She has been continued on GDMT and recommended to continue diet and exercise for management of nonischemic cardiomyopathy.  Seen by Dr. Domenic Polite on May 24, 2022.  She reported dyspnea with typical activities, was not fluid up.  Dr. Domenic Polite reviewed her last echocardiogram at this office visit that was from September 2022 that revealed EF 45 to 50%, Dr. Domenic Polite discussed adding Wilder Glade to her medication regimen with patient at office visit that day.  Wilder Glade was initiated.  Follow-up echocardiogram was arranged and performed on November 23, 2022 that revealed reduced EF to 25 to 30%, global hypokinesis of left ventricle, severely dilated left atrial size, trivial MR, otherwise no other significant valvular abnormalities.  I last saw this patient 1 month ago. She stopped taking Entresto in the early part of summer due to not being able to afford [facility], had restarted it this past week. Was tolerating all her  medications well. Was smoking about 6 cigarettes/day. Prescribed Nicotine patch. Told to follow-up in 6 weeks.   Today she presents for 6 week follow-up. She states she is doing well from a cardiac perspective.  Chief complaint today is rheumatoid arthritis pain.  She is going to see a specialist soon. Denies any chest pain, shortness of breath, palpitations, syncope, presyncope, dizziness, orthopnea, PND, swelling or significant weight changes, acute bleeding, or claudication.  Denies any other questions or concerns today.  Past Medical History:  Diagnosis Date   Atypical pneumonia    12/11   Cardiomyopathy    Nonischemic, normal coronaries, LVEF 40-45% >> LVEF 25-30% 10/2022   COPD (chronic obstructive pulmonary disease) (HCC)    Depression    Diverticulitis    per patient   Diverticulosis    Essential hypertension    Fibromyalgia    Morbid obesity ([place])    Obstructive sleep apnea    Rheumatoid arthritis(714.0)     Past Surgical History:  Procedure Laterality Date   COLONOSCOPY WITH PROPOFOL N/A 07/25/2022   Procedure: COLONOSCOPY WITH PROPOFOL;  Surgeon: Harvel Quale, MD;  [facility];  Service: Gastroenterology;  Laterality: N/A;  815   CYST REMOVAL TRUNK     POLYPECTOMY  07/25/2022   Procedure: POLYPECTOMY;  Surgeon: Montez Morita, Quillian Quince, MD;  Location: [facility];  Service: Gastroenterology;;  cecal and ascending;descending   ROTATOR CUFF REPAIR Right 02/2022   TUBAL LIGATION  1989   WRIST SURGERY  2016    Current Medications: Current Meds  Medication Sig   amLODipine (NORVASC) 10 MG tablet Take 10 mg by mouth  daily.   atorvastatin (LIPITOR) 10 MG tablet Take 10 mg by mouth daily.   carvedilol (COREG) 3.125 MG tablet TAKE 1 TABLET(3.125 MG) BY MOUTH TWICE DAILY   Cholecalciferol (VITAMIN D3 ADULT GUMMIES PO) Take 1 tablet by mouth daily.   dapagliflozin propanediol (FARXIGA) 10 MG TABS tablet Take 1 tablet (10 mg total) by mouth daily  before breakfast.   dicyclomine (BENTYL) 10 MG capsule TAKE 1 CAPSULE(10 MG) BY MOUTH EVERY 12 HOURS AS NEEDED FOR ABDOMINAL PAIN   docusate sodium (COLACE) 100 MG capsule Take 100 mg by mouth as needed.   ferrous sulfate 324 MG TBEC Take 324 mg by mouth daily.   furosemide (LASIX) 20 MG tablet Take 1 tablet (20 mg total) by mouth daily.   gabapentin (NEURONTIN) 300 MG capsule Take 300 mg by mouth 2 (two) times daily.   guaiFENesin-dextromethorphan (ROBITUSSIN DM) 100-10 MG/5ML syrup Take 5 mLs by mouth 3 (three) times daily as needed.   hydroxychloroquine (PLAQUENIL) 200 MG tablet TAKE 1 TABLET(200 MG) BY MOUTH TWICE DAILY   nicotine (NICODERM CQ - DOSED IN MG/24 HOURS) 14 mg/24hr patch Place 1 patch (14 mg total) onto the skin daily for 28 days.   sacubitril-valsartan (ENTRESTO) 49-51 MG Take 1 tablet by mouth 2 (two) times daily. Start on Thursday 11/14/18   sertraline (ZOLOFT) 100 MG tablet Take 100 mg by mouth daily.   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   Social Determinants of Health    Financial Resource Strain: Not on file  Food Insecurity: Not on file  Transportation Needs: Not on file  Physical Activity: Not on file  Stress: Not on file  Social Connections: Not on file     Family History: The patient's family history includes Breast cancer in her sister; Cancer in her sister; Diabetes in her maternal grandmother and paternal grandfather; Heart failure (age of onset: 23) in her sister and sister; Hypertension in her mother; Ovarian cancer in her sister; Stroke in her mother.  ROS:   Review of Systems  Constitutional: Negative.   HENT: Negative.    Eyes: Negative.   Respiratory: Negative.    Cardiovascular: Negative.   Gastrointestinal: Negative.   Genitourinary: Negative.   Musculoskeletal:  Positive for joint pain. Negative for back pain, falls, myalgias and neck pain.  Skin: Negative.   Neurological: Negative.   Endo/Heme/Allergies: Negative.  Psychiatric/Behavioral: Negative.       Please see the history of present illness.    All other systems reviewed and are negative.  EKGs/Labs/Other Studies Reviewed:    The following studies were reviewed today:   EKG:  EKG is not ordered today.     Echocardiogram on November 23, 2022:  1. Left ventricular ejection fraction, by estimation, is 25 to 30%. The  left ventricle has severely decreased function. The left ventricle  demonstrates global hypokinesis. Left ventricular diastolic parameters are  indeterminate. Elevated left atrial  pressure. The average left ventricular global longitudinal strain is -14.0  %. The global longitudinal strain is abnormal.   2. Right ventricular systolic function is normal. The right ventricular  size is normal.   3. Left atrial size was severely dilated.   4. The mitral valve is normal in structure. Trivial mitral valve  regurgitation. No evidence of mitral stenosis.   5. The tricuspid valve is abnormal.   6. The aortic valve has an indeterminant number of cusps.  Aortic valve  regurgitation is not visualized. No aortic stenosis is present.   7. The inferior vena cava is normal in size with greater than 50%  respiratory variability, suggesting right atrial pressure of 3 mmHg.   Comparison(s): Echocardiogram done 09/08/21 showed an EF of 45-50%.   Recent Labs: 06/08/2022: ALT 8; BUN 10; Creat 0.65; Hemoglobin 12.8; Platelets 267; Potassium 4.0; Sodium 142; TSH 1.47  Recent Lipid Panel No results found for: "CHOL", "TRIG", "HDL", "CHOLHDL", "VLDL", "LDLCALC", "LDLDIRECT"   Physical Exam:    VS:  BP 114/72   Pulse 63   Ht 5' 5.5" (1.664 m)   Wt 228 lb 9.6 oz (103.7 kg)   SpO2 97%   BMI 37.46 kg/m     Wt Readings from Last 3 Encounters:  01/08/23 228 lb 9.6 oz (103.7 kg)  12/08/22 227 lb 12.8 oz (103.3 kg)  08/09/22 226 lb 9.6 oz (102.8 kg)     GEN: Obese, 67 y.o. female in no acute distress HEENT: Normal NECK: No JVD; No carotid bruits CARDIAC: S1/S2, RRR, no murmurs, rubs, gallops; 2+ peripheral pulses throughout, strong and equal bilaterally RESPIRATORY:  Clear to auscultation without rales, wheezing or rhonchi  ABDOMEN: Soft, non-tender, non-distended MUSCULOSKELETAL:  No edema; No deformity  SKIN: Warm and dry NEUROLOGIC:  Alert and oriented x 3 PSYCHIATRIC:  Normal affect   ASSESSMENT:    1. HFrEF (heart failure with reduced ejection fraction) ([place])   2. Medication management   3. Hypertension, unspecified type   4. Obesity (BMI 30-39.9)   5. Tobacco abuse     PLAN:    In order of problems listed above:  Chronic systolic CHF with reduction in EF, HFrEF, medication management Most recent TTE revealed reduced EF from 45 to 50% to 25 to 30%.  This was most likely due to stopping Entresto for a while. Euvolemic and well compensated on exam.  Continue Entresto, carvedilol, Farxiga, Lasix, Entresto, and Aldactone. Current BP does not allow titration of GDMT. Low sodium diet, fluid restriction <2L, and daily weights encouraged.  Educated to contact our office for weight gain of 2 lbs overnight or 5 lbs in one week. Heart healthy diet and regular cardiovascular exercise encouraged.   HTN BP today 114/72. BP well controlled at home.  Continue current medication regimen. Discussed to monitor BP at home at least 2 hours after medications and sitting for 5-10 minutes. Heart healthy diet and regular cardiovascular exercise encouraged.  Obesity BMI today 37.46. Heart healthy diet and regular cardiovascular exercise encouraged. Weight loss via diet and exercise encouraged. Discussed the impact being overweight would have on cardiovascular risk.  4. Tobacco abuse Smoking cessation encouraged and discussed.  She is currently wearing a nicotine patch that we initiated last office visit.  She has cut down her tobacco use considerably.  I congratulated her.  Smoking cessation encouraged and discussed.  5. Disposition: Follow-up with me in 2-3 months or sooner if anything changes. Follow up with Dr. Domenic Polite in 5-6 months or sooner if anything changes.     Medication Adjustments/Labs and Tests Ordered: Current medicines are reviewed at length with the patient today.  Concerns regarding medicines are outlined above.  No orders of the defined types were placed in this encounter.  No orders of the defined types were placed in this encounter.   Patient Instructions  Medication Instructions:  Continue all current medications.   Labwork: none  Testing/Procedures: none  Follow-Up: 2-3 months - Finis Bud, NP 6 months - Dr. Domenic Polite   Any Other Special Instructions Will Be Listed Below (If Applicable).   If you need a refill on your cardiac medications before your next appointment, please call your pharmacy.    SignedFinis Bud, NP  01/08/2023 1:30 PM    [place]

## 2023-01-08 NOTE — Patient Instructions (Signed)
Medication Instructions:  Continue all current medications.   Labwork: none  Testing/Procedures: none  Follow-Up: 2-3 months - Finis Bud, NP 6 months - Dr. Domenic Polite   Any Other Special Instructions Will Be Listed Below (If Applicable).   If you need a refill on your cardiac medications before your next appointment, please call your pharmacy.

## 2023-01-09 ENCOUNTER — Encounter: Payer: Self-pay | Admitting: Orthopedic Surgery

## 2023-01-09 ENCOUNTER — Ambulatory Visit: Payer: Medicare Other | Admitting: Orthopedic Surgery

## 2023-01-09 VITALS — Ht 65.5 in | Wt 228.0 lb

## 2023-01-09 DIAGNOSIS — M654 Radial styloid tenosynovitis [de Quervain]: Secondary | ICD-10-CM

## 2023-01-09 NOTE — Patient Instructions (Signed)

## 2023-01-09 NOTE — Progress Notes (Signed)
New Patient Visit  Assessment: Caroline Mason is a 67 y.o. female with the following: 1. De Quervain's tenosynovitis, right  Plan: BRYNLEI KLAUSNER has pain in her right wrist and forearm area.  She does report an impact to the dorsal side of the wrist and hand.  However, this has progressively worsened.  She notes some swelling in the area of her wrist.  She also has some radiating pains from the dorsal and distal forearm.  She has been treated for de Quervain's tenosynovitis on the left, and her current presentation has some similarities.  We discussed proceeding with a first dorsal compartment injection, and she would like to proceed.  This was completed in clinic today.  She can also wear a brace on her right wrist as needed.  Procedure note injection Right 1st Dorsal Compartment (DeQuervain's)   Verbal consent was obtained to inject the right wrist  Timeout was completed to confirm the site of injection.  The skin was prepped with alcohol and ethyl chloride was sprayed at the injection site.  A 21-gauge needle was used to inject 40 mg of Depo-Medrol and 1% lidocaine (1 cc) into and around the 1st dorsal compartment  There were no complications. A sterile bandage was applied.   Follow-up: Return if symptoms worsen or fail to improve.  Subjective:  Chief Complaint  Patient presents with   Hand Pain    Rt hand/wrist pain for a few months. Pain is different than before and it's causing her hand to close up and pain into lower arm   NDC [address]-0 Lot: [id number] Exp: 03/24/24     History of Present Illness: Caroline Mason is a 67 y.o. female who presents for evaluation of right wrist pain.  She has a history of rheumatoid arthritis.  She has been followed by Dr. Durward Fortes in the past.  Recently, she and her wrist on a table.  Since then, she has noticed swelling in the dorsum of her hand and her wrist.  Medications have not been helpful.  She does have a brace.  She  previously had an injection in the left side, which improved her symptoms for similar type complaint.  She has some pain with motion of the wrist.   Review of Systems: No fevers or chills No numbness or tingling No chest pain No shortness of breath No bowel or bladder dysfunction No GI distress No headaches   Medical History:  Past Medical History:  Diagnosis Date   Atypical pneumonia    12/11   Cardiomyopathy    Nonischemic, normal coronaries, LVEF 40-45% >> LVEF 25-30% 10/2022   COPD (chronic obstructive pulmonary disease) (HCC)    Depression    Diverticulitis    per patient   Diverticulosis    Essential hypertension    Fibromyalgia    Morbid obesity ([place])    Obstructive sleep apnea    Rheumatoid arthritis(714.0)     Past Surgical History:  Procedure Laterality Date   COLONOSCOPY WITH PROPOFOL N/A 07/25/2022   Procedure: COLONOSCOPY WITH PROPOFOL;  Surgeon: Harvel Quale, MD;  [facility];  Service: Gastroenterology;  Laterality: N/A;  815   CYST REMOVAL TRUNK     POLYPECTOMY  07/25/2022   Procedure: POLYPECTOMY;  Surgeon: Harvel Quale, MD;  [facility];  Service: Gastroenterology;;  cecal and ascending;descending   ROTATOR CUFF REPAIR Right 02/2022   TUBAL LIGATION  1989   WRIST SURGERY  2016    Family History  Problem Relation Age of Onset   Stroke Mother    Hypertension Mother    Heart failure Sister 40   Breast cancer Sister    Heart failure Sister 51   Cancer Sister    Ovarian cancer Sister    Diabetes Maternal Grandmother    Diabetes Paternal Grandfather    Social History   Tobacco Use   Smoking status: Some Days    Packs/day: 0.25    Years: 30.00    Total pack years: 7.50    Types: Cigarettes    Start date: 06/03/1976    Passive exposure: Past   Smokeless tobacco: Never   Tobacco comments:    smokes 3-4 per day again  Vaping Use   Vaping Use: Never used  Substance Use Topics   Alcohol  use: No    Alcohol/week: 0.0 standard drinks of alcohol   Drug use: No    Allergies  Allergen Reactions   Aspirin     REACTION: GI UPSET    No outpatient medications have been marked as taking for the 01/09/23 encounter (Office Visit) with Mordecai Rasmussen, MD.    Objective: Ht 5' 5.5" (1.664 m)   Wt 228 lb (103.4 kg)   BMI 37.36 kg/m   Physical Exam:  General: Alert and oriented. and No acute distress. Gait: Normal gait.  Right wrist with swelling over the dorsal wrist and hand.  Tenderness palpation of the first dorsal compartment.  Positive Finkelstein's.  Pain with range of motion of the wrist.  Fingers are warm and well-perfused.   IMAGING: No new imaging obtained today   New Medications:  No orders of the defined types were placed in this encounter.     Mordecai Rasmussen, MD  01/09/2023 1:52 PM

## 2023-01-16 ENCOUNTER — Ambulatory Visit: Payer: Medicare Other | Attending: Rheumatology | Admitting: Rheumatology

## 2023-01-16 DIAGNOSIS — M19041 Primary osteoarthritis, right hand: Secondary | ICD-10-CM

## 2023-01-16 DIAGNOSIS — M797 Fibromyalgia: Secondary | ICD-10-CM

## 2023-01-16 DIAGNOSIS — Z8709 Personal history of other diseases of the respiratory system: Secondary | ICD-10-CM

## 2023-01-16 DIAGNOSIS — M0579 Rheumatoid arthritis with rheumatoid factor of multiple sites without organ or systems involvement: Secondary | ICD-10-CM

## 2023-01-16 DIAGNOSIS — Z79899 Other long term (current) drug therapy: Secondary | ICD-10-CM

## 2023-01-16 DIAGNOSIS — Z862 Personal history of diseases of the blood and blood-forming organs and certain disorders involving the immune mechanism: Secondary | ICD-10-CM

## 2023-01-16 DIAGNOSIS — M19072 Primary osteoarthritis, left ankle and foot: Secondary | ICD-10-CM

## 2023-01-16 DIAGNOSIS — M170 Bilateral primary osteoarthritis of knee: Secondary | ICD-10-CM

## 2023-01-16 DIAGNOSIS — Z8669 Personal history of other diseases of the nervous system and sense organs: Secondary | ICD-10-CM

## 2023-01-16 DIAGNOSIS — Z8639 Personal history of other endocrine, nutritional and metabolic disease: Secondary | ICD-10-CM

## 2023-01-16 DIAGNOSIS — G8929 Other chronic pain: Secondary | ICD-10-CM

## 2023-01-29 ENCOUNTER — Ambulatory Visit (INDEPENDENT_AMBULATORY_CARE_PROVIDER_SITE_OTHER): Payer: Medicare Other | Admitting: Gastroenterology

## 2023-01-29 ENCOUNTER — Encounter (INDEPENDENT_AMBULATORY_CARE_PROVIDER_SITE_OTHER): Payer: Self-pay | Admitting: Gastroenterology

## 2023-01-29 VITALS — BP 108/72 | HR 80 | Temp 97.5°F | Ht 65.0 in | Wt 229.0 lb

## 2023-01-29 DIAGNOSIS — K589 Irritable bowel syndrome without diarrhea: Secondary | ICD-10-CM | POA: Insufficient documentation

## 2023-01-29 DIAGNOSIS — K529 Noninfective gastroenteritis and colitis, unspecified: Secondary | ICD-10-CM | POA: Diagnosis not present

## 2023-01-29 DIAGNOSIS — K580 Irritable bowel syndrome with diarrhea: Secondary | ICD-10-CM

## 2023-01-29 DIAGNOSIS — R109 Unspecified abdominal pain: Secondary | ICD-10-CM | POA: Insufficient documentation

## 2023-01-29 DIAGNOSIS — R1084 Generalized abdominal pain: Secondary | ICD-10-CM | POA: Diagnosis not present

## 2023-01-29 MED ORDER — RIFAXIMIN 550 MG PO TABS: 550.0000 mg | ORAL_TABLET | Freq: Three times a day (TID) | ORAL | 0 refills | Status: DC

## 2023-01-29 NOTE — Progress Notes (Unsigned)
Maylon Peppers, M.D. Gastroenterology & Hepatology [facility] [address] [place], [place] [address]  Primary Care Physician: [facility], [facility] [place] [address]  I will communicate my assessment and recommendations to the referring MD via EMR.  Problems: Chronic abdominal pain and loose stools, possibly related to IBS  History of Present Illness: Caroline Mason is a 67 y.o. female with PMH nonischemic cardiomyopathy, FBM, obesity, depression, RA, COPD,  HTN, who presents for follow-up of abdominal pain and loose stools.  The patient was last seen on 06/08/2022. At that time, the patient had celiac serologies, CRP, TSH, CBC and CMP checked which were within normal limits.  Colonoscopy was performed which showed the finding described below.  She was prescribed Bentyl as needed and to implement a low FODMAP diet.  Patient reports that she has presented recurrent episodes of pain in her lower abdomen for the last year. States the pain is like a cramping pain that does not radiate anywhere else. States Bentyl helps some to decrease her pain but does not completely control it. She is having fecal urgency after she eats some food. States stools are usually soft but can have 6-7 Bms per day without blood or mucus. Not taking any antidiarrheal.  Notably, she does not have any bowel movements or abdominal pain when she is sleeping.  Has not followed a diet too strictly, but has tried to avoid certain foods like beef as it causes the symptoms to be more severe. However, she states that any type of food or drink may trigger the abdominal pain or having a bowel movement.  The patient denies having any nausea, vomiting, fever, chills, hematochezia, melena, hematemesis, abdominal distention, jaundice, pruritus or weight loss.  Last EGD: Never Last Colonoscopy:07/2022 5 polyps cecum, ascending colon and transverse between 2 to 6 mm in  size, 2 polyps between 3 to 5 mm in the descending colon, all polyps removed (five tubular adenomas and 3 SSLs), diverticulosis.   repeat colonoscopy in 3 years  Past Medical History: Past Medical History:  Diagnosis Date   Atypical pneumonia    12/11   Cardiomyopathy    Nonischemic, normal coronaries, LVEF 40-45% >> LVEF 25-30% 10/2022   COPD (chronic obstructive pulmonary disease) (HCC)    Depression    Diverticulitis    per patient   Diverticulosis    Essential hypertension    Fibromyalgia    Morbid obesity ([place])    Obstructive sleep apnea    Rheumatoid arthritis(714.0)     Past Surgical History: Past Surgical History:  Procedure Laterality Date   COLONOSCOPY WITH PROPOFOL N/A 07/25/2022   Procedure: COLONOSCOPY WITH PROPOFOL;  Surgeon: Harvel Quale, MD;  [facility];  Service: Gastroenterology;  Laterality: N/A;  815   CYST REMOVAL TRUNK     POLYPECTOMY  07/25/2022   Procedure: POLYPECTOMY;  Surgeon: Montez Morita, Quillian Quince, MD;  Location: [facility];  Service: Gastroenterology;;  cecal and ascending;descending   ROTATOR CUFF REPAIR Right 02/2022   TUBAL LIGATION  1989   WRIST SURGERY  2016    Family History: Family History  Problem Relation Age of Onset   Stroke Mother    Hypertension Mother    Heart failure Sister 50   Breast cancer Sister    Heart failure Sister 28   Cancer Sister    Ovarian cancer Sister    Diabetes Maternal Grandmother    Diabetes Paternal Grandfather  Social History: Social History   Tobacco Use  Smoking Status Some Days   Packs/day: 0.25   Years: 30.00   Total pack years: 7.50   Types: Cigarettes   Start date: 06/03/1976   Passive exposure: Past  Smokeless Tobacco Never  Tobacco Comments   smokes 3-4 per day again   Social History   Substance and Sexual Activity  Alcohol Use No   Alcohol/week: 0.0 standard drinks of alcohol   Social History   Substance and Sexual Activity  Drug Use No     Allergies: Allergies  Allergen Reactions   Aspirin     REACTION: GI UPSET    Medications: Current Outpatient Medications  Medication Sig Dispense Refill   amLODipine (NORVASC) 10 MG tablet Take 10 mg by mouth daily.     atorvastatin (LIPITOR) 10 MG tablet Take 10 mg by mouth daily.     carvedilol (COREG) 3.125 MG tablet TAKE 1 TABLET(3.125 MG) BY MOUTH TWICE DAILY 180 tablet 1   Cholecalciferol (VITAMIN D3 ADULT GUMMIES PO) Take 1 tablet by mouth daily.     dapagliflozin propanediol (FARXIGA) 10 MG TABS tablet Take 1 tablet (10 mg total) by mouth daily before breakfast. 30 tablet 6   dicyclomine (BENTYL) 10 MG capsule TAKE 1 CAPSULE(10 MG) BY MOUTH EVERY 12 HOURS AS NEEDED FOR ABDOMINAL PAIN 60 capsule 2   docusate sodium (COLACE) 100 MG capsule Take 100 mg by mouth as needed.     ferrous sulfate 324 MG TBEC Take 324 mg by mouth daily.     furosemide (LASIX) 20 MG tablet Take 1 tablet (20 mg total) by mouth daily. 90 tablet 2   gabapentin (NEURONTIN) 300 MG capsule Take 300 mg by mouth 2 (two) times daily.     guaiFENesin-dextromethorphan (ROBITUSSIN DM) 100-10 MG/5ML syrup Take 5 mLs by mouth 3 (three) times daily as needed.     hydroxychloroquine (PLAQUENIL) 200 MG tablet TAKE 1 TABLET(200 MG) BY MOUTH TWICE DAILY 180 tablet 0   nicotine (NICODERM CQ - DOSED IN MG/24 HR) 7 mg/24hr patch Place 1 patch (7 mg total) onto the skin daily. 28 patch 0   sacubitril-valsartan (ENTRESTO) 49-51 MG Take 1 tablet by mouth 2 (two) times daily. Start on Thursday 11/14/18 60 tablet 6   sertraline (ZOLOFT) 100 MG tablet Take 100 mg by mouth daily.     spironolactone (ALDACTONE) 25 MG tablet TAKE 1 TABLET(25 MG) BY MOUTH DAILY 90 tablet 0   No current facility-administered medications for this visit.    Review of Systems: GENERAL: negative for malaise, night sweats HEENT: No changes in hearing or vision, no nose bleeds or other nasal problems. NECK: Negative for lumps, goiter, pain and  significant neck swelling RESPIRATORY: Negative for cough, wheezing CARDIOVASCULAR: Negative for chest pain, leg swelling, palpitations, orthopnea GI: SEE HPI MUSCULOSKELETAL: Negative for joint pain or swelling, back pain, and muscle pain. SKIN: Negative for lesions, rash PSYCH: Negative for sleep disturbance, mood disorder and recent psychosocial stressors. HEMATOLOGY Negative for prolonged bleeding, bruising easily, and swollen nodes. ENDOCRINE: Negative for cold or heat intolerance, polyuria, polydipsia and goiter. NEURO: negative for tremor, gait imbalance, syncope and seizures. The remainder of the review of systems is noncontributory.   Physical Exam: BP 108/72 (BP Location: Right Arm, Patient Position: Sitting, Cuff Size: Large)   Pulse 80   Temp (!) 97.5 F (36.4 C) (Temporal)   Ht '5\' 5"'$  (1.651 m)   Wt 229 lb (103.9 kg)   BMI 38.11 kg/m  GENERAL: The patient is AO x3, in no acute distress. Obese.  HEENT: Head is normocephalic and atraumatic. EOMI are intact. Mouth is well hydrated and without lesions. NECK: Supple. No masses LUNGS: Clear to auscultation. No presence of rhonchi/wheezing/rales. Adequate chest expansion HEART: RRR, normal s1 and s2. ABDOMEN: Soft, nontender, no guarding, no peritoneal signs, and nondistended. BS +. No masses. EXTREMITIES: Without any cyanosis, clubbing, rash, lesions or edema. NEUROLOGIC: AOx3, no focal motor deficit. SKIN: no jaundice, no rashes  Imaging/Labs: as above  I personally reviewed and interpreted the available labs, imaging and endoscopic files.  Impression and Plan:. Caroline Mason is a 67 y.o. female with PMH nonischemic cardiomyopathy, FBM, obesity, depression, RA, COPD,  HTN, who presents for follow-up of abdominal pain and loose stools.  Patient has presented persistent symptoms despite trying to implement dietary changes.  I still encouraged her to maintain a low FODMAP diet as I consider her symptoms are classic for  IBS-D, however given the persistence of symptoms we will try a 2-week course of Xifaxan.  We will also check other etiologies such as alpha gal with serologies.  Ultimately, if she were to have persistent symptoms with negative workup, may need to proceed with a CT angio abdomen and pelvis with IV contrast.  - check alpha gal - Start Xifaxan '550mg'$  by mouth three times daily for 14 days - Try to implement low FODMAP diet as much as possible - If no improvement, may proceed with CT angio abdomen and pelvis  All questions were answered.      Maylon Peppers, MD Gastroenterology and Hepatology [facility]

## 2023-01-29 NOTE — Patient Instructions (Signed)
Perform blood workup Start Xifaxan '550mg'$  by mouth three times daily for 14 days Try to implement low FODMAP diet as much as possible If no improvement, may proceed with CT angio abdomen and pelvis

## 2023-02-01 NOTE — Addendum Note (Signed)
Addended by: Harvel Quale on: 02/01/2023 03:10 PM   Modules accepted: Level of Service

## 2023-02-05 DIAGNOSIS — R1084 Generalized abdominal pain: Secondary | ICD-10-CM | POA: Diagnosis not present

## 2023-02-05 DIAGNOSIS — K529 Noninfective gastroenteritis and colitis, unspecified: Secondary | ICD-10-CM | POA: Diagnosis not present

## 2023-02-06 ENCOUNTER — Telehealth (INDEPENDENT_AMBULATORY_CARE_PROVIDER_SITE_OTHER): Payer: Self-pay | Admitting: *Deleted

## 2023-02-06 NOTE — Telephone Encounter (Signed)
Thank you. Please let the patient know that she needs to try over-the-counter Imodium as needed (up to 4 tablets/day) for the next 3 months and let us know if her symptoms improve.  If not, we will resubmit the PA for Xifaxan.

## 2023-02-06 NOTE — Telephone Encounter (Signed)
Discussed with patient -   Please let the patient know that she needs to try over-the-counter Imodium as needed (up to 4 tablets/day) for the next 3 months and let us know if her symptoms improve.  If not, we will resubmit the PA for Xifaxan.   Patient verbalized understanding.

## 2023-02-06 NOTE — Telephone Encounter (Signed)
PA submitted for xifaxan. Med denied for not meeting the prior auth requirements. Must have a trial and failure, contraindication or intolerance to an anti-diarrheal drug - loperamide.

## 2023-02-07 ENCOUNTER — Other Ambulatory Visit: Payer: Self-pay | Admitting: Cardiology

## 2023-02-08 LAB — ALPHA-GAL PANEL
Allergen, Mutton, f88: <0.1 kU/L
Allergen, Pork, f26: <0.1 kU/L
Beef: 0.11 kU/L — ABNORMAL HIGH
CLASS: 0
Class: 0
GALACTOSE-ALPHA-1,3-GALACTOSE IGE*: 1.37 kU/L — ABNORMAL HIGH (ref <0.10)

## 2023-02-08 LAB — INTERPRETATION:

## 2023-02-20 NOTE — Progress Notes (Unsigned)
Office Visit Note  Patient: Caroline Mason             Date of Birth: 05-12-1956           MRN: [id number]             PCP: [facility] Referring: [facility] Visit Date: 02/21/2023 Occupation: '@GUAROCC'$ @  Subjective:  Pain in both hands   History of Present Illness: Caroline Mason is a 67 y.o. female with history of seropositive rheumatoid arthritis and osteoarthritis.  Patient is prescribed Plaquenil 200 mg 1 tablet by mouth twice daily.  Patient reports that she has been out of her prescription for Plaquenil for 1 month due to requiring updated lab work and a follow-up visit.  Patient reports that she is scheduled to updated Plaquenil eye examination in June 2024.  Patient reports that she is currently having a flare in both hands and both wrist joints.  She has been having difficulty using her hands due to severity of symptoms.  She had a left de Quervain's tenosynovitis injection in October 2023 and a right de Quervain's tenosynovitis injection in January 2024 per patient.  Her symptoms have persisted.  She has not found Plaquenil to be effective at managing her rheumatoid arthritis due to experiencing frequent and severe flares.  She is open to discuss other treatment options.  She states that she had some relief after undergoing Visco gel injections in the left knee in August/September 2023 but is not yet ready to proceed with repeat injections at this time.     Activities of Daily Living:  Patient reports morning stiffness for 2-3 hours.   Patient Reports nocturnal pain.  Difficulty dressing/grooming: Reports Difficulty climbing stairs: Reports Difficulty getting out of chair: Reports Difficulty using hands for taps, buttons, cutlery, and/or writing: Reports  Review of Systems  Constitutional:  Positive for fatigue.  HENT: Negative.  Negative for mouth sores and mouth dryness.   Eyes:  Positive for dryness.  Respiratory: Negative.  Negative for  shortness of breath.   Cardiovascular: Negative.  Negative for chest pain and palpitations.  Gastrointestinal:  Positive for constipation. Negative for blood in stool and diarrhea.  Endocrine: Negative.  Negative for increased urination.  Genitourinary: Negative.  Negative for involuntary urination.  Musculoskeletal:  Positive for joint pain, joint pain, joint swelling, myalgias, muscle weakness, morning stiffness, muscle tenderness and myalgias. Negative for gait problem.  Skin: Negative.  Negative for color change, rash, hair loss and sensitivity to sunlight.  Allergic/Immunologic: Negative.  Negative for susceptible to infections.  Neurological: Negative.  Negative for dizziness and headaches.  Hematological: Negative.  Negative for swollen glands.  Psychiatric/Behavioral:  Positive for sleep disturbance. Negative for depressed mood. The patient is not nervous/anxious.     PMFS History:  Patient Active Problem List   Diagnosis Date Noted   Chronic diarrhea 01/29/2023   Abdominal pain 01/29/2023   IBS (irritable bowel syndrome) 01/29/2023   Harriet Pho disease (radial styloid tenosynovitis) 09/14/2022   Abdominal pain, left lower quadrant 06/08/2022   Loose stools 06/08/2022   Unspecified rotator cuff tear or rupture of right shoulder, not specified as traumatic 02/15/2022   Ganglion cyst of wrist, right 08/14/2018   Trigger thumb, right thumb 03/27/2018   High risk medication use 12/01/2016   Rheumatoid arthritis with rheumatoid factor of multiple sites without organ or systems involvement ([place]) 12/01/2016   Fibromyalgia 12/01/2016   Primary osteoarthritis of both hands 12/01/2016   Primary osteoarthritis of  both knees 12/01/2016   Primary osteoarthritis of both feet 12/01/2016   Bradycardia [id number]   Chronic systolic heart failure ([place]) 06/05/2011   ANEMIA 03/01/2011   TOBACCO ABUSE 01/03/2011   OBSTRUCTIVE SLEEP APNEA 01/03/2011   ESSENTIAL HYPERTENSION, BENIGN  01/03/2011   Secondary cardiomyopathy ([place]) 01/03/2011   COPD 01/03/2011    Past Medical History:  Diagnosis Date   Atypical pneumonia    12/11   Cardiomyopathy    Nonischemic, normal coronaries, LVEF 40-45% >> LVEF 25-30% 10/2022   COPD (chronic obstructive pulmonary disease) ([place])    Depression    Diverticulitis    per patient   Diverticulosis    Essential hypertension    Fibromyalgia    Morbid obesity ([place])    Obstructive sleep apnea    Rheumatoid arthritis(714.0)     Family History  Problem Relation Age of Onset   Stroke Mother    Hypertension Mother    Heart failure Sister 72   Breast cancer Sister    Heart failure Sister 55   Cancer Sister    Ovarian cancer Sister    Diabetes Maternal Grandmother    Diabetes Paternal Grandfather    Past Surgical History:  Procedure Laterality Date   COLONOSCOPY WITH PROPOFOL N/A 07/25/2022   Procedure: COLONOSCOPY WITH PROPOFOL;  Surgeon: Harvel Quale, MD;  [facility];  Service: Gastroenterology;  Laterality: N/A;  815   CYST REMOVAL TRUNK     POLYPECTOMY  07/25/2022   Procedure: POLYPECTOMY;  Surgeon: Harvel Quale, MD;  [facility];  Service: Gastroenterology;;  cecal and ascending;descending   ROTATOR CUFF REPAIR Right 02/2022   TUBAL LIGATION  1989   WRIST SURGERY  2016   Social History   Social History Narrative   Lives in [place].   Immunization History  Administered Date(s) Administered   Influenza-Unspecified 09/24/2014, 10/25/2018   Moderna Sars-Covid-2 Vaccination 02/09/2020, 03/11/2020, 11/30/2020     Objective: Vital Signs: BP 125/78 (BP Location: Left Arm, Patient Position: Sitting, Cuff Size: Large)   Pulse 65   Resp 16   Ht '5\' 5"'$  (1.651 m)   Wt 229 lb 12.8 oz (104.2 kg)   BMI 38.24 kg/m    Physical Exam Vitals and nursing note reviewed.  Constitutional:      Appearance: She is well-developed.  HENT:     Head: Normocephalic and atraumatic.   Eyes:     Conjunctiva/sclera: Conjunctivae normal.  Cardiovascular:     Rate and Rhythm: Normal rate and regular rhythm.     Heart sounds: Normal heart sounds.  Pulmonary:     Effort: Pulmonary effort is normal.     Breath sounds: Normal breath sounds.  Abdominal:     General: Bowel sounds are normal.     Palpations: Abdomen is soft.  Musculoskeletal:     Cervical back: Normal range of motion.  Skin:    General: Skin is warm and dry.     Capillary Refill: Capillary refill takes less than 2 seconds.  Neurological:     Mental Status: She is alert and oriented to person, place, and time.  Psychiatric:        Behavior: Behavior normal.      Musculoskeletal Exam: C-spine has good range of motion.  Shoulder joints have good range of motion.  Elbow joints have good range of motion with no tenderness or inflammation.  She has tenderness and synovitis of the right wrist on examination today.  No synovitis over MCP joints noted.  Left radial styloid tenosynovitis noted.  Complete fist formation bilaterally.  PIP and DIP thickening consistent with osteoarthritis of both hands.  Hip joints have good range of motion with no groin pain.  Left knee joint has slightly limited extension.  No warmth or effusion of knee joints noted.  Ankle joints have good range of motion with no tenderness or synovitis.  CDAI Exam: CDAI Score: 3.4  Patient Global: 8 mm; Provider Global: 6 mm Swollen: 1 ; Tender: 1  Joint Exam 02/21/2023      Right  Left  Wrist  Swollen Tender        Investigation: No additional findings.  Imaging: No results found.  Recent Labs: Lab Results  Component Value Date   WBC 6.9 06/08/2022   HGB 12.8 06/08/2022   PLT 267 06/08/2022   NA 142 06/08/2022   K 4.0 06/08/2022   CL 107 06/08/2022   CO2 23 06/08/2022   GLUCOSE 86 06/08/2022   BUN 10 06/08/2022   CREATININE 0.65 06/08/2022   BILITOT 0.8 06/08/2022   ALKPHOS 102 12/05/2016   AST 12 06/08/2022   ALT 8  06/08/2022   PROT 7.2 06/08/2022   ALBUMIN 4.0 12/05/2016   CALCIUM 9.0 06/08/2022   GFRAA 108 09/29/2020    Speciality Comments: PLQ Eye Exam:01/17/2022 [facility] follow up 1 year  Procedures:  No procedures performed Allergies: Aspirin      Assessment / Plan:     Visit Diagnoses: Rheumatoid arthritis with rheumatoid factor of multiple sites without organ or systems involvement ([place]) - +RF, synovitis: Patient presents today with increased pain and inflammation involving both hands and both wrist joints.  She has synovitis in the right wrist noted on examination today.  She has been out of her prescription for Plaquenil for 1 month due to requiring updated lab work, updated Plaquenil eye examination, and a routine office visit.  She is scheduled to have an updated Plaquenil eye examination performed in June 2024.  Patient reports that she continues to have frequent flares even while taking Plaquenil consistently.  She has been having recurrent flares in both wrists since fall 2023.  Different treatment options were discussed today.  In the past the use of methotrexate has been discussed but she has not tried any other immunosuppressive agents previously.  Indications, contraindications, and potential side effects of methotrexate were discussed today in detail.  All questions were addressed and consent was obtained.  Plan on obtaining baseline immunosuppressive labs along with CBC and CMP prior to initiating methotrexate.  She will start on methotrexate 6 tablets by mouth once weekly x 2 weeks and if labs are stable she will increase to 8 tablets once weekly.  She will also take folic acid 2 mg daily.  She will remain on Plaquenil as combination therapy.  A refill of Plaquenil sent to the pharmacy today. Prior to initiating methotrexate I recommended receiving written clearance by GI.  According to the patient she has had questionable infectious diarrhea recently and has been  undergoing a thorough workup.  Once we receive clearance from GI that she has no signs or symptoms of an infection we can send in the prescription for methotrexate.  She is aware that once she starts methotrexate she will require updated lab work in 2 weeks x 2, 2 months, then every 3 months.  She is also aware that she should hold methotrexate if she develops signs or symptoms of an infection in the future and to  resume once the infection is completely cleared.  She will follow-up in the office in 8 weeks to assess her response to combination therapy.  Drug Counseling TB Gold: Pending  Hepatitis panel: Pending  Chest-xray:  Ordered  Contraception: Postmenopausal  Alcohol use: Discussed the importance of avoiding alcohol use   Patient was counseled on the purpose, proper use, and adverse effects of methotrexate including nausea, infection, and signs and symptoms of pneumonitis.  Reviewed instructions with patient to take methotrexate weekly along with folic acid daily.  Discussed the importance of frequent monitoring of kidney and liver function and blood counts, and provided patient with standing lab instructions.  Counseled patient to avoid NSAIDs and alcohol while on methotrexate.  Provided patient with educational materials on methotrexate and answered all questions.  Advised patient to get annual influenza vaccine and to get a pneumococcal vaccine if patient has not already had one.  Patient voiced understanding.  Patient consented to methotrexate use.  Will upload into chart.     High risk medication use - Plaquenil 200 mg 1 tablet by mouth twice daily.  PLQ Eye Exam:01/17/2022 [facility] follow up 1 year.  Due to update plaquenil eye exam.  Scheduled in June 2024.  Orders for CBC and CMP released today. The following baseline immunosuppressive labs will be updated today prior to initiating methotrexate.  Baseline CXR also ordered.  Patient will obtain written clearance from GI  prior to initiating methotrexate.  She will notify us when she would like a prescription for methotrexate to be sent to the pharmacy.  We will hold off on sending the prescription for methotrexate and folic acid until we hear from her/have written clearance.  - Plan: CBC with Differential/Platelet, COMPLETE METABOLIC PANEL WITH GFR, Hepatitis B core antibody, IgM, Hepatitis B surface antigen, Hepatitis C antibody, QuantiFERON-TB Gold Plus, Serum protein electrophoresis with reflex, IgG, IgA, IgM, DG Chest 2 View  Primary osteoarthritis of both hands: PIP DIP thickening consistent with osteoarthritis of both hands noted.  Is been experiencing increased pain and swelling in both hands and both wrist joints since fall 2023.  She has not found Plaquenil to be effective at managing her rheumatoid arthritis.  As discussed above plan to add on methotrexate as combination therapy. Discussed the importance of joint protection and muscle strengthening.  Chronic right shoulder pain - History of complete rotator cuff tear of right shoulder-surgically repaired by Dr. Durward Fortes in March 2023.  Primary osteoarthritis of both knees - Left knee-Severe OA-underwent Visco in August/September 2023.  She has noticed improvement in the left knee joint pain and mobility since undergoing visco gel injections.  She is not yet ready to reapply for visco at this time.   Primary osteoarthritis of both feet: She is not experiencing any increased discomfort in the feet at this time.   Fibromyalgia: Intermittent myalgias and muscle tenderness due to fibromyalgia.  Other medical conditions are listed as follows:   History of COPD  History of vitamin D deficiency  History of anemia  History of sleep apnea  Orders: Orders Placed This Encounter  Procedures   DG Chest 2 View   CBC with Differential/Platelet   COMPLETE METABOLIC PANEL WITH GFR   Hepatitis B core antibody, IgM   Hepatitis B surface antigen   Hepatitis C  antibody   QuantiFERON-TB Gold Plus   Serum protein electrophoresis with reflex   IgG, IgA, IgM   Meds ordered this encounter  Medications   hydroxychloroquine (PLAQUENIL) 200  MG tablet    Sig: TAKE 1 TABLET(200 MG) BY MOUTH TWICE DAILY    Dispense:  180 tablet    Refill:  0    Follow-Up Instructions: Return in about 8 weeks (around 04/18/2023) for Rheumatoid arthritis.   [name], PA-C  Note - This record has been created using Dragon software.  Chart creation errors have been sought, but may not always  have been located. Such creation errors do not reflect on  the standard of medical care.

## 2023-02-21 ENCOUNTER — Ambulatory Visit: Payer: Medicare Other | Attending: Physician Assistant | Admitting: Physician Assistant

## 2023-02-21 ENCOUNTER — Encounter: Payer: Self-pay | Admitting: Physician Assistant

## 2023-02-21 VITALS — BP 125/78 | HR 65 | Resp 16 | Ht 65.0 in | Wt 229.8 lb

## 2023-02-21 DIAGNOSIS — M19072 Primary osteoarthritis, left ankle and foot: Secondary | ICD-10-CM

## 2023-02-21 DIAGNOSIS — M19042 Primary osteoarthritis, left hand: Secondary | ICD-10-CM

## 2023-02-21 DIAGNOSIS — M170 Bilateral primary osteoarthritis of knee: Secondary | ICD-10-CM | POA: Diagnosis not present

## 2023-02-21 DIAGNOSIS — M25511 Pain in right shoulder: Secondary | ICD-10-CM | POA: Diagnosis not present

## 2023-02-21 DIAGNOSIS — M19041 Primary osteoarthritis, right hand: Secondary | ICD-10-CM | POA: Diagnosis not present

## 2023-02-21 DIAGNOSIS — Z862 Personal history of diseases of the blood and blood-forming organs and certain disorders involving the immune mechanism: Secondary | ICD-10-CM

## 2023-02-21 DIAGNOSIS — M0579 Rheumatoid arthritis with rheumatoid factor of multiple sites without organ or systems involvement: Secondary | ICD-10-CM | POA: Diagnosis not present

## 2023-02-21 DIAGNOSIS — Z8709 Personal history of other diseases of the respiratory system: Secondary | ICD-10-CM

## 2023-02-21 DIAGNOSIS — Z8639 Personal history of other endocrine, nutritional and metabolic disease: Secondary | ICD-10-CM

## 2023-02-21 DIAGNOSIS — Z8669 Personal history of other diseases of the nervous system and sense organs: Secondary | ICD-10-CM

## 2023-02-21 DIAGNOSIS — M19071 Primary osteoarthritis, right ankle and foot: Secondary | ICD-10-CM | POA: Diagnosis not present

## 2023-02-21 DIAGNOSIS — M797 Fibromyalgia: Secondary | ICD-10-CM | POA: Diagnosis not present

## 2023-02-21 DIAGNOSIS — G8929 Other chronic pain: Secondary | ICD-10-CM

## 2023-02-21 DIAGNOSIS — Z79899 Other long term (current) drug therapy: Secondary | ICD-10-CM | POA: Diagnosis not present

## 2023-02-21 MED ORDER — HYDROXYCHLOROQUINE SULFATE 200 MG PO TABS: ORAL_TABLET | ORAL | 0 refills | Status: DC

## 2023-02-21 NOTE — Patient Instructions (Signed)
Standing Labs We placed an order today for your standing lab work.   Please have your standing labs drawn in 2 weeks x2, 2 months, then every 3 months   Please have your labs drawn 2 weeks prior to your appointment so that the provider can discuss your lab results at your appointment, if possible.  Please note that you may see your imaging and lab results in [place] before we have reviewed them. We will contact you once all results are reviewed. Please allow our office up to 72 hours to thoroughly review all of the results before contacting the office for clarification of your results.  WALK-IN LAB HOURS  Monday through Thursday from 8:00 am -12:30 pm and 1:00 pm-5:00 pm and Friday from 8:00 am-12:00 pm.  Patients with office visits requiring labs will be seen before walk-in labs.  You may encounter longer than normal wait times. Please allow additional time. Wait times may be shorter on  Monday and Thursday afternoons.  We do not book appointments for walk-in labs. We appreciate your patience and understanding with our staff.   Labs are drawn by [facility]. Please bring your co-pay at the time of your lab draw.  You may receive a bill from [facility] for your lab work.  Please note if you are on Hydroxychloroquine and and an order has been placed for a Hydroxychloroquine level,  you will need to have it drawn 4 hours or more after your last dose.  If you wish to have your labs drawn at another location, please call the office 24 hours in advance so we can fax the orders.  The office is located at [address] [place], [place], [place] [address]   If you have any questions regarding directions or hours of operation,  please call [phone].   As a reminder, please drink plenty of water prior to coming for your lab work. Thanks!   Methotrexate Tablets What is this medication? METHOTREXATE (METH oh TREX ate) treats autoimmune conditions, such as arthritis and psoriasis. It works by  decreasing inflammation, which can reduce pain and prevent long-term injury to the joints and skin. It may also be used to treat some types of cancer. It works by slowing down the growth of cancer cells. This medicine may be used for other purposes; ask your health care provider or pharmacist if you have questions. COMMON BRAND NAME(S): Rheumatrex, Trexall What should I tell my care team before I take this medication? They need to know if you have any of these conditions: Dehydration Diabetes Fluid in the stomach area or lungs Frequently drink alcohol Having surgery, including dental surgery High cholesterol Immune system problems Inflammatory bowel disease, such as ulcerative colitis Kidney disease Liver disease Low blood cell levels (white cells, red cells, and platelets) Lung disease Recent or ongoing radiation Recent or upcoming vaccine Stomach ulcers, other stomach or intestine problems An unusual or allergic reaction to methotrexate, other medications, foods, dyes, or preservatives Pregnant or trying to get pregnant Breastfeeding How should I use this medication? Take this medication by mouth with water. Take it as directed on the prescription label. Do not take extra. Keep taking this medication until your care team tells you to stop. Know why you are taking this medication and how you should take it. To treat conditions such as arthritis and psoriasis, this medication is taken ONCE A WEEK as a single dose or divided into 3 smaller doses taken 12 hours apart (do not take more than 3  doses 12 hours apart each week). This medication is NEVER taken daily to treat conditions other than cancer. Taking this medication more often than directed can cause serious side effects, even death. Talk to your care team about why you are taking this medication, how often you will take it, and what your dose is. Ask your care team to put the reason you take this medication on the prescription. If you  take this medication ONCE A WEEK, choose a day of the week before you start. Ask your pharmacist to include the day of the week on the label. Avoid "Monday", which could be misread as "Morning". Handling this medication may be harmful. Talk to your care team about how to handle this medication. Special instructions may apply. Talk to your care team about the use of this medication in children. While it may be prescribed for selected conditions, precautions do apply. Overdosage: If you think you have taken too much of this medicine contact a poison control center or emergency room at once. NOTE: This medicine is only for you. Do not share this medicine with others. What if I miss a dose? If you miss a dose, talk with your care team. Do not take double or extra doses. What may interact with this medication? Do not take this medication with any of the following: Acitretin Live virus vaccines Probenecid This medication may also interact with the following: Alcohol Aspirin and aspirin-like medications Certain antibiotics, such as penicillin, neomycin, sulfamethoxazole; trimethoprim Certain medications for stomach problems, such as lansoprazole, omeprazole, pantoprazole Clozapine Cyclosporine Dapsone Folic acid [place]et NSAIDs, medications for pain and inflammation, such as ibuprofen or naproxen Phenytoin Pyrimethamine Steroid medications, such as prednisone or cortisone Tacrolimus Theophylline This list may not describe all possible interactions. Give your health care provider a list of all the medicines, herbs, non-prescription drugs, or dietary supplements you use. Also tell them if you smoke, drink alcohol, or use illegal drugs. Some items may interact with your medicine. What should I watch for while using this medication? Visit your care team for regular checks on your progress. It may be some time before you see the benefit from this medication. You may need blood work done while you  are taking this medication. If your care team has also prescribed folic acid, they may instruct you to skip your folic acid dose on the day you take methotrexate. This medication can make you more sensitive to the sun. Keep out of the sun. If you cannot avoid being in the sun, wear protective clothing and sunscreen. Do not use sun lamps, tanning beds, or tanning booths. Check with your care team if you have severe diarrhea, nausea, and vomiting, or if you sweat a lot. The loss of too much body fluid may make it dangerous for you to take this medication. This medication may increase your risk of getting an infection. Call your care team for advice if you get a fever, chills, sore throat, or other symptoms of a cold or flu. Do not treat yourself. Try to avoid being around people who are sick. Talk to your care team about your risk of cancer. You may be more at risk for certain types of cancers if you take this medication. Talk to your care team if you or your partner may be pregnant. Serious birth defects can occur if you take this medication during pregnancy and for 6 months after the last dose. You will need a negative pregnancy test before starting this medication. Contraception is  recommended while taking this medication and for 6 months after the last dose. Your care team can help you find the option that works for you. If your partner can get pregnant, use a condom during sex while taking this medication and for 3 months after the last dose. Do not breastfeed while taking this medication and for 1 week after the last dose. This medication may cause infertility. Talk to your care team if you are concerned about your fertility. What side effects may I notice from receiving this medication? Side effects that you should report to your care team as soon as possible: Allergic reactions--skin rash, itching, hives, swelling of the face, lips, tongue, or throat Dry cough, shortness of breath or trouble  breathing Infection--fever, chills, cough, sore throat, wounds that don't heal, pain or trouble when passing urine, general feeling of discomfort or being unwell Kidney injury--decrease in the amount of urine, swelling of the ankles, hands, or feet Liver injury--right upper belly pain, loss of appetite, nausea, light-colored stool, dark yellow or brown urine, yellowing skin or eyes, unusual weakness or fatigue Low red blood cell level--unusual weakness or fatigue, dizziness, headache, trouble breathing Pain, tingling, or numbness in the hands or feet, muscle weakness, change in vision, confusion or trouble speaking, loss of balance or coordination, trouble walking, seizures Redness, blistering, peeling, or loosening of the skin, including inside the mouth Stomach bleeding--bloody or black, tar-like stools, vomiting blood or brown material that looks like coffee grounds Stomach pain that is severe, does not away, or gets worse Unusual bruising or bleeding Side effects that usually do not require medical attention (report these to your care team if they continue or are bothersome): Diarrhea Dizziness Hair loss Nausea Pain, redness, or swelling with sores inside the mouth or throat Skin reactions on sun-exposed areas Vomiting This list may not describe all possible side effects. Call your doctor for medical advice about side effects. You may report side effects to FDA at 1-800-FDA-1088. Where should I keep my medication? Keep out of the reach of children and pets. Store at room temperature between 20 and 25 degrees C (68 and 77 degrees F). Protect from light. Keep the container tightly closed. Get rid of any unused medication after the expiration date. To get rid of medications that are no longer needed or have expired: Take the medication to a medication take-back program. Check with your pharmacy or law enforcement to find a location. If you cannot return the medication, ask your pharmacist or  care team how to get rid of this medication safely. NOTE: This sheet is a summary. It may not cover all possible information. If you have questions about this medicine, talk to your doctor, pharmacist, or health care provider.  2023 Elsevier/Gold Standard (2022-09-29 00:00:00)

## 2023-02-23 LAB — PROTEIN ELECTROPHORESIS, SERUM, WITH REFLEX
Albumin ELP: 3.9 g/dL (ref 3.8–4.8)
Alpha 1: 0.3 g/dL (ref 0.2–0.3)
Alpha 2: 0.7 g/dL (ref 0.5–0.9)
Beta 2: 0.5 g/dL (ref 0.2–0.5)
Beta Globulin: 0.4 g/dL (ref 0.4–0.6)
Gamma Globulin: 1.2 g/dL (ref 0.8–1.7)
Total Protein: 7 g/dL (ref 6.1–8.1)

## 2023-02-23 LAB — IGG, IGA, IGM
IgG (Immunoglobin G), Serum: 1114 mg/dL (ref 600–1540)
IgM, Serum: 159 mg/dL (ref 50–300)
Immunoglobulin A: 339 mg/dL — ABNORMAL HIGH (ref 70–320)

## 2023-02-23 LAB — COMPLETE METABOLIC PANEL WITH GFR
AG Ratio: 1.4 (calc) (ref 1.0–2.5)
ALT: 8 U/L (ref 6–29)
AST: 12 U/L (ref 10–35)
Albumin: 4.1 g/dL (ref 3.6–5.1)
Alkaline phosphatase (APISO): 119 U/L (ref 37–153)
BUN: 12 mg/dL (ref 7–25)
CO2: 20 mmol/L (ref 20–32)
Calcium: 9.3 mg/dL (ref 8.6–10.4)
Chloride: 107 mmol/L (ref 98–110)
Creat: 0.71 mg/dL (ref 0.50–1.05)
Globulin: 2.9 g/dL (calc) (ref 1.9–3.7)
Glucose, Bld: 76 mg/dL (ref 65–99)
Potassium: 4.1 mmol/L (ref 3.5–5.3)
Sodium: 143 mmol/L (ref 135–146)
Total Bilirubin: 0.6 mg/dL (ref 0.2–1.2)
Total Protein: 7 g/dL (ref 6.1–8.1)
eGFR: 95 mL/min/{1.73_m2} (ref >=60)

## 2023-02-23 LAB — CBC WITH DIFFERENTIAL/PLATELET
Absolute Monocytes: 538 cells/uL (ref 200–950)
Basophils Absolute: 28 cells/uL (ref 0–200)
Basophils Relative: 0.4 %
Eosinophils Absolute: 62 cells/uL (ref 15–500)
Eosinophils Relative: 0.9 %
HCT: 42 % (ref 35.0–45.0)
Hemoglobin: 13.7 g/dL (ref 11.7–15.5)
Lymphs Abs: 2519 cells/uL (ref 850–3900)
MCH: 28 pg (ref 27.0–33.0)
MCHC: 32.6 g/dL (ref 32.0–36.0)
MCV: 85.7 fL (ref 80.0–100.0)
MPV: 9.7 fL (ref 7.5–12.5)
Monocytes Relative: 7.8 %
Neutro Abs: 3754 cells/uL (ref 1500–7800)
Neutrophils Relative %: 54.4 %
Platelets: 270 10*3/uL (ref 140–400)
RBC: 4.9 10*6/uL (ref 3.80–5.10)
RDW: 13.3 % (ref 11.0–15.0)
Total Lymphocyte: 36.5 %
WBC: 6.9 10*3/uL (ref 3.8–10.8)

## 2023-02-23 LAB — QUANTIFERON-TB GOLD PLUS
Mitogen-NIL: 7.77 IU/mL
NIL: 0.02 IU/mL
QuantiFERON-TB Gold Plus: NEGATIVE
TB1-NIL: 0 IU/mL
TB2-NIL: 0 IU/mL

## 2023-02-23 LAB — HEPATITIS B SURFACE ANTIGEN: Hepatitis B Surface Ag: NONREACTIVE

## 2023-02-23 LAB — HEPATITIS B CORE ANTIBODY, IGM: Hep B C IgM: NONREACTIVE

## 2023-02-23 LAB — HEPATITIS C ANTIBODY: Hepatitis C Ab: NONREACTIVE

## 2023-02-25 NOTE — Progress Notes (Signed)
IgA mildly elevated and not significant, CBC normal, CMP normal, hepatitis B and hepatitis C negative, TB Gold negative, SPEP normal.

## 2023-02-26 NOTE — Progress Notes (Signed)
Patient will require clearance by GI prior to starting methotrexate.   She will also require a baseline CXR prior to initiating therapy.

## 2023-03-01 DIAGNOSIS — E7849 Other hyperlipidemia: Secondary | ICD-10-CM | POA: Diagnosis not present

## 2023-03-01 DIAGNOSIS — R7301 Impaired fasting glucose: Secondary | ICD-10-CM | POA: Diagnosis not present

## 2023-03-01 DIAGNOSIS — E039 Hypothyroidism, unspecified: Secondary | ICD-10-CM | POA: Diagnosis not present

## 2023-03-01 DIAGNOSIS — E559 Vitamin D deficiency, unspecified: Secondary | ICD-10-CM | POA: Diagnosis not present

## 2023-03-01 DIAGNOSIS — I10 Essential (primary) hypertension: Secondary | ICD-10-CM | POA: Diagnosis not present

## 2023-03-01 DIAGNOSIS — N183 Chronic kidney disease, stage 3 unspecified: Secondary | ICD-10-CM | POA: Diagnosis not present

## 2023-03-01 DIAGNOSIS — D509 Iron deficiency anemia, unspecified: Secondary | ICD-10-CM | POA: Diagnosis not present

## 2023-03-01 DIAGNOSIS — I5032 Chronic diastolic (congestive) heart failure: Secondary | ICD-10-CM | POA: Diagnosis not present

## 2023-03-07 ENCOUNTER — Telehealth: Payer: Self-pay | Admitting: *Deleted

## 2023-03-07 ENCOUNTER — Telehealth: Payer: Self-pay

## 2023-03-07 ENCOUNTER — Other Ambulatory Visit: Payer: Self-pay | Admitting: *Deleted

## 2023-03-07 NOTE — Telephone Encounter (Signed)
Left message with [facility] rheumatology clinical staff asking for a call back to clarify what they need on the note. [phone].

## 2023-03-07 NOTE — Telephone Encounter (Signed)
Contacted [name] at [facility] at [address] to advise Prior to initiating methotrexate it is recommended receiving written clearance by GI. According to the patient she has had questionable infectious diarrhea recently and has been undergoing a thorough workup. Once we receive clearance from GI that she has no signs or symptoms of an infection we can send in the prescription for methotrexate. Caroline Mason verbalized understanding.

## 2023-03-07 NOTE — Telephone Encounter (Signed)
Abigail Butts from [facility] at [address] contacted the office and left a message in regards to our mutual patient, Caroline Mason DOB: 11/01/1956. Abigail Butts states she got a call from a nurse needing a letter from gastroenterology stating the patient is not on any antibiotics. Abigail Butts states the the patient is supposed to start a new medication. Abigail Butts states she contacted the office to obtain clarification about the information that needs to be included in the letter. [name]'s call back number is [phone]. Please advise.

## 2023-03-07 NOTE — Telephone Encounter (Signed)
Nurse at [facility] rheumatology called back and they want to start patient on methotrexate. They wanted written clearance before starting med due to patient having symptoms of infectious diarrhea and stating she is not on any antibiotics. I called patient and she told me she is not taking any antibiotics and she never picked up xifaxan because insurance would not pay.  Fax letter to [phone]

## 2023-03-07 NOTE — Telephone Encounter (Signed)
Please send the following note to the rheumatology clinic: Patient is cleared from the gastrointestinal perspective to start methotrexate. No concern for infectious etiologies. Should monitor LFTs after starting MTX. Xifaxan was prescribed for IBS-D. Thanks

## 2023-03-07 NOTE — Telephone Encounter (Signed)
Patient left vm that she needs a note faxed to Dr. Estanislado Pandy at [facility] rheumatology stating that she is not on any antibiotics so they can start her on a new arthritis med.   [phone]

## 2023-03-08 ENCOUNTER — Encounter (INDEPENDENT_AMBULATORY_CARE_PROVIDER_SITE_OTHER): Payer: Self-pay | Admitting: *Deleted

## 2023-03-08 NOTE — Telephone Encounter (Signed)
Letter written and put on Dr. Lurline Del desk for signature.

## 2023-03-08 NOTE — Telephone Encounter (Signed)
Tried calling again. Unable to leave message. Voicemail is full.

## 2023-03-08 NOTE — Telephone Encounter (Signed)
Tried calling no answer. Vm full.

## 2023-03-08 NOTE — Telephone Encounter (Signed)
Letter faxed to [phone] Dr. Arlean Hopping office. Tried to call and notify patient vm was full and unable to leave a message.

## 2023-03-09 NOTE — Telephone Encounter (Signed)
Tried calling no answer. Vm full.  

## 2023-03-12 DIAGNOSIS — D126 Benign neoplasm of colon, unspecified: Secondary | ICD-10-CM | POA: Diagnosis not present

## 2023-03-12 DIAGNOSIS — M797 Fibromyalgia: Secondary | ICD-10-CM | POA: Diagnosis not present

## 2023-03-12 DIAGNOSIS — E7849 Other hyperlipidemia: Secondary | ICD-10-CM | POA: Diagnosis not present

## 2023-03-12 DIAGNOSIS — I10 Essential (primary) hypertension: Secondary | ICD-10-CM | POA: Diagnosis not present

## 2023-03-12 DIAGNOSIS — R7301 Impaired fasting glucose: Secondary | ICD-10-CM | POA: Diagnosis not present

## 2023-03-12 DIAGNOSIS — Z0001 Encounter for general adult medical examination with abnormal findings: Secondary | ICD-10-CM | POA: Diagnosis not present

## 2023-03-12 DIAGNOSIS — F17210 Nicotine dependence, cigarettes, uncomplicated: Secondary | ICD-10-CM | POA: Diagnosis not present

## 2023-03-12 DIAGNOSIS — M0580 Other rheumatoid arthritis with rheumatoid factor of unspecified site: Secondary | ICD-10-CM | POA: Diagnosis not present

## 2023-03-12 DIAGNOSIS — I5042 Chronic combined systolic (congestive) and diastolic (congestive) heart failure: Secondary | ICD-10-CM | POA: Diagnosis not present

## 2023-03-12 DIAGNOSIS — I428 Other cardiomyopathies: Secondary | ICD-10-CM | POA: Diagnosis not present

## 2023-03-12 NOTE — Telephone Encounter (Signed)
Patient notified letter faxed.

## 2023-03-14 ENCOUNTER — Other Ambulatory Visit: Payer: Self-pay | Admitting: *Deleted

## 2023-03-14 DIAGNOSIS — Z79899 Other long term (current) drug therapy: Secondary | ICD-10-CM

## 2023-03-14 NOTE — Telephone Encounter (Signed)
Received clearance letter from Dr. Jenetta Downer advising patient is cleared to start Methotrexate from a GI perspective.   Per office note on  02/21/2023: She will start on methotrexate 6 tablets by mouth once weekly x 2 weeks and if labs are stable she will increase to 8 tablets once weekly. She will also take folic acid 2 mg daily. She will remain on Plaquenil as combination therapy. once she starts methotrexate she will require updated lab work in 2 weeks x 2, 2 months, then every 3 months.   Attempted to contact the patient. Unable to leave a message, voicemail is full.

## 2023-03-15 NOTE — Telephone Encounter (Signed)
Attempted to contact the patient. Unable to leave a message, voicemail is full.

## 2023-03-15 NOTE — Telephone Encounter (Signed)
Attempted to contact the patient. Unable to leave a voicemail because the voicemail box was full.

## 2023-03-16 NOTE — Telephone Encounter (Signed)
Attempted to contact the patient and left message for patient to call the office.  

## 2023-03-20 NOTE — Telephone Encounter (Signed)
Attempted to contact the patient. Unable to leave a message, voicemail is full.

## 2023-03-21 MED ORDER — METHOTREXATE SODIUM 2.5 MG PO TABS: ORAL_TABLET | ORAL | 0 refills | Status: DC

## 2023-03-21 MED ORDER — FOLIC ACID 1 MG PO TABS: 2.0000 mg | ORAL_TABLET | Freq: Every day | ORAL | 3 refills | Status: DC

## 2023-03-21 NOTE — Addendum Note (Signed)
Addended by: [name] on: 03/21/2023 01:05 PM   Modules accepted: Orders

## 2023-03-21 NOTE — Addendum Note (Signed)
Addended by: Carole Binning on: 03/21/2023 12:18 PM   Modules accepted: Orders

## 2023-03-21 NOTE — Telephone Encounter (Signed)
Patient advised No need for repeat CXR at this time.

## 2023-03-21 NOTE — Telephone Encounter (Signed)
No need for repeat CXR at this time.

## 2023-03-26 ENCOUNTER — Ambulatory Visit: Payer: Medicare Other | Admitting: Nurse Practitioner

## 2023-04-05 ENCOUNTER — Encounter: Payer: Self-pay | Admitting: Nurse Practitioner

## 2023-04-05 ENCOUNTER — Ambulatory Visit: Payer: Medicare Other | Attending: Nurse Practitioner | Admitting: Nurse Practitioner

## 2023-04-05 VITALS — BP 118/72 | HR 68 | Ht 65.0 in | Wt 229.2 lb

## 2023-04-05 DIAGNOSIS — I5020 Unspecified systolic (congestive) heart failure: Secondary | ICD-10-CM | POA: Diagnosis not present

## 2023-04-05 DIAGNOSIS — Z720 Tobacco use: Secondary | ICD-10-CM

## 2023-04-05 DIAGNOSIS — I10 Essential (primary) hypertension: Secondary | ICD-10-CM

## 2023-04-05 DIAGNOSIS — E669 Obesity, unspecified: Secondary | ICD-10-CM | POA: Diagnosis not present

## 2023-04-05 NOTE — Patient Instructions (Signed)
Medication Instructions:  Continue all current medications.   Labwork: none  Testing/Procedures: Your physician has requested that you have an echocardiogram. Echocardiography is a painless test that uses sound waves to create images of your heart. It provides your doctor with information about the size and shape of your heart and how well your heart's chambers and valves are working. This procedure takes approximately one hour. There are no restrictions for this procedure. Please do NOT wear cologne, perfume, aftershave, or lotions (deodorant is allowed). Please arrive 15 minutes prior to your appointment time. DUE IN 3 MONTHS   Follow-Up: 4 months   Any Other Special Instructions Will Be Listed Below (If Applicable).   If you need a refill on your cardiac medications before your next appointment, please call your pharmacy.

## 2023-04-05 NOTE — Progress Notes (Unsigned)
Office Visit Note  Patient: Caroline Mason             Date of Birth: May 04, 1956           MRN: [id number]             PCP: [facility] Referring: [facility] Visit Date: 04/19/2023 Occupation: @GUAROCC @  Subjective:  Medication monitoring   History of Present Illness: Caroline Mason is a 67 y.o. female with history of seropositive rheumatoid arthritis and osteoarthritis.  She is taking Plaquenil 200 mg 1 tablet by mouth twice daily and methotrexate 8 tablets by mouth once weekly.  She has been tolerating combination therapy without any side effects and has not missed any doses recently.  She requested a refill of methotrexate to be sent to the pharmacy today.  She has noticed about 25% improvement in her joint pain and stiffness since initiating methotrexate.  She continues to have some inflammation and soreness in the left wrist joint. She denies any new medical conditions.  She denies any recent or recurrent infections since initiating methotrexate.  She is scheduled to update a Plaquenil eye examination in May 2024.    Activities of Daily Living:  Patient reports morning stiffness for 1 hour.   Patient Reports nocturnal pain.  Difficulty dressing/grooming: Reports Difficulty climbing stairs: Denies Difficulty getting out of chair: Denies Difficulty using hands for taps, buttons, cutlery, and/or writing: Reports  Review of Systems  Constitutional:  Positive for fatigue.  HENT:  Negative for mouth sores and mouth dryness.   Eyes:  Positive for dryness.  Respiratory:  Negative for shortness of breath.   Cardiovascular:  Negative for chest pain and palpitations.  Gastrointestinal:  Negative for blood in stool, constipation and diarrhea.  Endocrine: Negative for increased urination.  Genitourinary:  Negative for involuntary urination.  Musculoskeletal:  Positive for joint pain, joint pain, joint swelling, myalgias, morning stiffness, muscle  tenderness and myalgias. Negative for gait problem and muscle weakness.  Skin:  Negative for color change, rash, hair loss and sensitivity to sunlight.  Allergic/Immunologic: Negative for susceptible to infections.  Neurological:  Positive for headaches. Negative for dizziness.  Hematological:  Negative for swollen glands.  Psychiatric/Behavioral:  Positive for sleep disturbance. Negative for depressed mood. The patient is not nervous/anxious.     PMFS History:  Patient Active Problem List   Diagnosis Date Noted   Chronic diarrhea 01/29/2023   Abdominal pain 01/29/2023   IBS (irritable bowel syndrome) 01/29/2023   Suzette Battiest disease (radial styloid tenosynovitis) 09/14/2022   Abdominal pain, left lower quadrant 06/08/2022   Loose stools 06/08/2022   Unspecified rotator cuff tear or rupture of right shoulder, not specified as traumatic 02/15/2022   Ganglion cyst of wrist, right 08/14/2018   Trigger thumb, right thumb 03/27/2018   High risk medication use 12/01/2016   Rheumatoid arthritis with rheumatoid factor of multiple sites without organ or systems involvement 12/01/2016   Fibromyalgia 12/01/2016   Primary osteoarthritis of both hands 12/01/2016   Primary osteoarthritis of both knees 12/01/2016   Primary osteoarthritis of both feet 12/01/2016   Bradycardia 07/18/2011   Chronic systolic heart failure 06/05/2011   ANEMIA 03/01/2011   TOBACCO ABUSE 01/03/2011   OBSTRUCTIVE SLEEP APNEA 01/03/2011   ESSENTIAL HYPERTENSION, BENIGN 01/03/2011   Secondary cardiomyopathy 01/03/2011   COPD 01/03/2011    Past Medical History:  Diagnosis Date   Atypical pneumonia    12/11   Cardiomyopathy    Nonischemic, normal coronaries, LVEF  40-45% >> LVEF 25-30% 10/2022   COPD (chronic obstructive pulmonary disease)    Depression    Diverticulitis    per patient   Diverticulosis    Essential hypertension    Fibromyalgia    Morbid obesity    Obstructive sleep apnea    Rheumatoid  arthritis(714.0)     Family History  Problem Relation Age of Onset   Stroke Mother    Hypertension Mother    Heart failure Sister 61   Breast cancer Sister    Heart failure Sister 18   Cancer Sister    Ovarian cancer Sister    Diabetes Maternal Grandmother    Diabetes Paternal Grandfather    Past Surgical History:  Procedure Laterality Date   COLONOSCOPY WITH PROPOFOL N/A 07/25/2022   Procedure: COLONOSCOPY WITH PROPOFOL;  Surgeon: Dolores Frame, MD;  [facility];  Service: Gastroenterology;  Laterality: N/A;  815   CYST REMOVAL TRUNK     POLYPECTOMY  07/25/2022   Procedure: POLYPECTOMY;  Surgeon: Dolores Frame, MD;  [facility];  Service: Gastroenterology;;  cecal and ascending;descending   ROTATOR CUFF REPAIR Right 02/2022   TUBAL LIGATION  1989   WRIST SURGERY  2016   Social History   Social History Narrative   Lives in [place].   Immunization History  Administered Date(s) Administered   Influenza-Unspecified 09/24/2014, 10/25/2018   Moderna Sars-Covid-2 Vaccination 02/09/2020, 03/11/2020, 11/30/2020     Objective: Vital Signs: BP 123/85 (BP Location: Left Arm, Patient Position: Sitting, Cuff Size: Large)   Pulse 65   Resp 17   Ht 5' 5.5" (1.664 m)   Wt 232 lb 6.4 oz (105.4 kg)   BMI 38.09 kg/m    Physical Exam Vitals and nursing note reviewed.  Constitutional:      Appearance: She is well-developed.  HENT:     Head: Normocephalic and atraumatic.  Eyes:     Conjunctiva/sclera: Conjunctivae normal.  Cardiovascular:     Rate and Rhythm: Normal rate and regular rhythm.     Heart sounds: Normal heart sounds.  Pulmonary:     Effort: Pulmonary effort is normal.     Breath sounds: Normal breath sounds.  Abdominal:     General: Bowel sounds are normal.     Palpations: Abdomen is soft.  Musculoskeletal:     Cervical back: Normal range of motion.  Lymphadenopathy:     Cervical: No cervical adenopathy.   Skin:    General: Skin is warm and dry.     Capillary Refill: Capillary refill takes less than 2 seconds.  Neurological:     Mental Status: She is alert and oriented to person, place, and time.  Psychiatric:        Behavior: Behavior normal.      Musculoskeletal Exam: C-spine, thoracic spine, lumbar spine have good range of motion.  Shoulder joints and elbow joints have good range of motion.  Some tenderness and mild inflammation on the radial aspect of her left wrist.  No tenderness or synovitis over MCP joints.  Complete fist formation bilaterally.  Hip joints have good range of motion with no groin pain.  Knee joints have good range of motion with no warmth or effusion.  Ankle joints have good range of motion with no tenderness or joint swelling.  CDAI Exam: CDAI Score: 1.8  Patient Global: 4 mm; Provider Global: 4 mm Swollen: 0 ; Tender: 1  Joint Exam 04/19/2023      Right  Left  Wrist      Tender     Investigation: No additional findings.  Imaging: No results found.  Recent Labs: Lab Results  Component Value Date   WBC 7.3 04/12/2023   HGB 13.6 04/12/2023   PLT 269 04/12/2023   NA 143 04/12/2023   K 4.1 04/12/2023   CL 106 04/12/2023   CO2 28 04/12/2023   GLUCOSE 82 04/12/2023   BUN 11 04/12/2023   CREATININE 0.81 04/12/2023   BILITOT 0.7 04/12/2023   ALKPHOS 102 12/05/2016   AST 11 04/12/2023   ALT 8 04/12/2023   PROT 7.3 04/12/2023   ALBUMIN 4.0 12/05/2016   CALCIUM 9.2 04/12/2023   GFRAA 108 09/29/2020   QFTBGOLDPLUS NEGATIVE 02/21/2023    Speciality Comments: PLQ Eye Exam:01/17/2022 WNL [facility] follow up 1 year  Procedures:  No procedures performed Allergies: Aspirin   Assessment / Plan:     Visit Diagnoses: Rheumatoid arthritis with rheumatoid factor of multiple sites without organ or systems involvement - +RF, synovitis:  She has noticed about a 25% improvement in joint stiffness and discomfort since initiating methotrexate as  combination therapy after her last office visit.  She has been tolerating methotrexate without any side effects and is currently taking 8 tablets once weekly along with folic acid 2 mg daily.  She remains on Plaquenil 200 mg 1 tablet by mouth twice daily.  She continues to have some tenderness and inflammation in the left wrist but has started to notice improvement in the generalized aching and stiffness.  She is willing to give combination therapy more time and for [place] to reassess for the full efficacy in 2 to 3 months.  High risk medication use - Methotrexate 8 tablets by mouth once weekly, folic acid 2 mg daily, and Plaquenil 200 mg 1 tablet by mouth twice daily.  PLQ Eye Exam:01/17/2022 WNL [facility] follow up 1 year.  Patient is scheduled to update a Plaquenil eye examination in May 2024.  She was given a plaquenil eye exam form to take with her to her upcoming appointment. CBC and CMP WNL on 04/12/23.  She will return for lab work in 2 weeks, 2 months, then every 3 months.  Standing orders for CBC and CMP remain in place. Discussed the importance of holding methotrexate if she develops signs or symptoms of an infection and to resume once the infection has completely cleared.  Primary osteoarthritis of both hands: She has PIP and DIP thickening consistent with osteoarthritis of both hands.  Chronic right shoulder pain:  History of complete rotator cuff tear of right shoulder-surgically repaired by Dr. Cleophas Dunker in March 2023. Good range of motion with no discomfort.  Primary osteoarthritis of both knees - Left knee-Severe OA-underwent Visco in August/September 2023.  Improved.  Good range of motion of both knee joints on examination today.  No warmth or effusion noted.  Primary osteoarthritis of both feet: She is not experiencing any increased discomfort in her feet at this time.  Fibromyalgia: Intermittent flares.  She has been remaining active performing yard work.  Other medical  conditions are listed as follows:  History of COPD  History of vitamin D deficiency  History of anemia  History of sleep apnea  Orders: No orders of the defined types were placed in this encounter.  Meds ordered this encounter  Medications   methotrexate (RHEUMATREX) 2.5 MG tablet    Sig: Take 8 tabs by mouth once weekly.  Caution:Chemotherapy. Protect from light.  Dispense:  96 tablet    Refill:  0      Follow-Up Instructions: Return for Rheumatoid arthritis, Osteoarthritis.   Gearldine Bienenstock, PA-C  Note - This record has been created using Dragon software.  Chart creation errors have been sought, but may not always  have been located. Such creation errors do not reflect on  the standard of medical care.

## 2023-04-05 NOTE — Progress Notes (Signed)
Cardiology Office Note:    Date: 04/05/2023  ID:  Caroline Mason, DOB 09-28-1956, MRN [id number]  PCP:  [facility]   [facility] Providers Cardiologist:  Nona Dell, MD     Referring MD: [facility]   CC: Here for CHF follow-up  History of Present Illness:    Caroline Mason is a delightful 67 y.o. female with a hx of the following:  Hypertension Chronic systolic heart failure, secondary cardiomyopathy COPD OSA Rheumatoid arthritis De Quervain's disease Tobacco abuse Anemia Fibromyalgia Diverticulosis  In 2013, Echo revealed 45 to 50%, grade 1 DD, with diffuse hypokinesis, mild left atrial enlargement, was felt to be nonischemic as previous cath revealed normal coronary arteries.    Seen by Dr. Diona Browner on May 24, 2022.  She reported dyspnea with typical activities, was not fluid up.  Follow-up echocardiogram was arranged and performed on November 23, 2022 that revealed reduced EF to 25 to 30%, global hypokinesis of left ventricle, severely dilated left atrial size, trivial MR, otherwise no other significant valvular abnormalities.  First saw patient 11/2022. Stopped taking Entresto in the early part of summer due to not being able to afford [facility], had restarted it shortly prior to office visit. Was tolerating all her medications well. Was smoking about 6 cigarettes/day. Prescribed Nicotine patch. At last visit continued to do well from a cardiac perspective, compliant with meds.  Today she presents for follow-up. Doing well. Recently started methotrexate for RA. Compliant with meds, denies any issues. Wt is stable. Denies any chest pain, shortness of breath, palpitations, syncope, presyncope, dizziness, orthopnea, PND, swelling or significant weight changes, acute bleeding, or claudication.  Past Medical History:  Diagnosis Date   Atypical pneumonia    12/11   Cardiomyopathy    Nonischemic, normal coronaries, LVEF 40-45%  >> LVEF 25-30% 10/2022   COPD (chronic obstructive pulmonary disease)    Depression    Diverticulitis    per patient   Diverticulosis    Essential hypertension    Fibromyalgia    Morbid obesity    Obstructive sleep apnea    Rheumatoid arthritis(714.0)     Past Surgical History:  Procedure Laterality Date   COLONOSCOPY WITH PROPOFOL N/A 07/25/2022   Procedure: COLONOSCOPY WITH PROPOFOL;  Surgeon: Dolores Frame, MD;  [facility];  Service: Gastroenterology;  Laterality: N/A;  815   CYST REMOVAL TRUNK     POLYPECTOMY  07/25/2022   Procedure: POLYPECTOMY;  Surgeon: Marguerita Merles, Reuel Boom, MD;  [facility];  Service: Gastroenterology;;  cecal and ascending;descending   ROTATOR CUFF REPAIR Right 02/2022   TUBAL LIGATION  1989   WRIST SURGERY  2016    Current Medications: Current Meds  Medication Sig   amLODipine (NORVASC) 10 MG tablet Take 10 mg by mouth daily.   atorvastatin (LIPITOR) 10 MG tablet Take 10 mg by mouth daily.   carvedilol (COREG) 3.125 MG tablet TAKE 1 TABLET(3.125 MG) BY MOUTH TWICE DAILY   Cholecalciferol (VITAMIN D3 ADULT GUMMIES PO) Take 1 tablet by mouth daily.   dicyclomine (BENTYL) 10 MG capsule TAKE 1 CAPSULE(10 MG) BY MOUTH EVERY 12 HOURS AS NEEDED FOR ABDOMINAL PAIN   docusate sodium (COLACE) 100 MG capsule Take 100 mg by mouth as needed.   FARXIGA 10 MG TABS tablet TAKE 1 TABLET(10 MG) BY MOUTH DAILY BEFORE BREAKFAST   folic acid (FOLVITE) 1 MG tablet Take 2 tablets (2 mg total) by mouth daily.   furosemide (LASIX) 20 MG  tablet Take 1 tablet (20 mg total) by mouth daily.   gabapentin (NEURONTIN) 300 MG capsule Take 300 mg by mouth 2 (two) times daily.   hydroxychloroquine (PLAQUENIL) 200 MG tablet TAKE 1 TABLET(200 MG) BY MOUTH TWICE DAILY   methotrexate (RHEUMATREX) 2.5 MG tablet Take 6 tabs po weekly x 2 weeks. If labs are stable increase to 8 tabs po weekly.  Caution:Chemotherapy. Protect from light.   nicotine  (NICODERM CQ - DOSED IN MG/24 HR) 7 mg/24hr patch Place 1 patch (7 mg total) onto the skin daily.   rifaximin (XIFAXAN) 550 MG TABS tablet Take 1 tablet (550 mg total) by mouth 3 (three) times daily.   sacubitril-valsartan (ENTRESTO) 49-51 MG Take 1 tablet by mouth 2 (two) times daily. Start on Thursday 11/14/18   sertraline (ZOLOFT) 100 MG tablet Take 100 mg by mouth daily.   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    [place] [employer]  Tobacco Use   Smoking status: Some Days    Packs/day: 0.25    Years: 30.00    Additional pack years: 0.00    Total pack years: 7.50    Types: Cigarettes    Start date: 06/03/1976    Passive exposure: Past   Smokeless tobacco: Never   Tobacco comments:    smokes 3-4 per day again  Vaping Use   Vaping Use: Never used  Substance and Sexual Activity   Alcohol use: No    Alcohol/week: 0.0 standard drinks of alcohol   Drug use: No   Sexual activity: Yes    Birth control/protection: None  Other Topics Concern   Not on file  Social History Narrative   Lives in [place].   Social Determinants of Health   Financial Resource Strain: Not on file  Food Insecurity: Not on file  Transportation Needs: Not on file  Physical Activity: Not on file  Stress: Not on file  Social Connections: Not on file     Family History: The patient's family history includes Breast cancer in her sister; Cancer in her sister; Diabetes in her maternal grandmother and paternal grandfather; Heart failure (age of onset: 750) in her sister and sister; Hypertension in her mother; Ovarian cancer in her sister; Stroke in her mother.  ROS:       Please see the history of present illness.    All other systems reviewed and are negative.  EKGs/Labs/Other Studies Reviewed:    The following studies were reviewed today:   EKG:  EKG is not ordered today.     Echocardiogram on November 23, 2022:  1. Left ventricular ejection fraction, by estimation, is 25 to 30%. The  left ventricle has severely decreased function. The left ventricle  demonstrates global hypokinesis. Left ventricular diastolic parameters are  indeterminate. Elevated left atrial  pressure. The average left ventricular global longitudinal strain is -14.0  %. The global longitudinal strain is abnormal.   2. Right ventricular systolic function is normal. The right ventricular  size is normal.   3. Left atrial size was severely dilated.   4. The mitral valve is normal in structure. Trivial  mitral valve  regurgitation. No evidence of mitral stenosis.   5. The tricuspid valve is abnormal.   6. The aortic valve has an indeterminant number of cusps. Aortic valve  regurgitation is not visualized. No aortic stenosis is present.   7. The inferior vena cava is normal in size with greater than 50%  respiratory variability, suggesting right atrial pressure of 3 mmHg.   Comparison(s): Echocardiogram done 09/08/21 showed an EF of 45-50%.   Recent Labs: 06/08/2022: TSH 1.47 02/21/2023: ALT 8; BUN 12; Creat 0.71; Hemoglobin 13.7; Platelets 270; Potassium 4.1; Sodium 143  Recent Lipid Panel No results found for: "CHOL", "TRIG", "HDL", "CHOLHDL", "VLDL", "LDLCALC", "LDLDIRECT"   Physical Exam:    VS:  BP 118/72   Pulse 68   Ht 5\' 5"  (1.651 m)   Wt 229 lb 3.2 oz (104 kg)   SpO2 95%   BMI 38.14 kg/m     Wt Readings from Last 3 Encounters:  04/05/23 229 lb 3.2 oz (104 kg)  02/21/23 229 lb 12.8 oz (104.2 kg)  01/29/23 229 lb (103.9 kg)     GEN: Obese, 67 y.o. female in no acute distress HEENT: Normal NECK: No JVD; No carotid bruits CARDIAC: S1/S2, RRR, no  murmurs, rubs, gallops; 2+ peripheral pulses throughout, strong and equal bilaterally RESPIRATORY:  Clear to auscultation without rales, wheezing or rhonchi  ABDOMEN: Soft, non-tender, non-distended MUSCULOSKELETAL:  Trace nonpitting edema to BLE; No deformity  SKIN: Warm and dry NEUROLOGIC:  Alert and oriented x 3 PSYCHIATRIC:  Normal affect   ASSESSMENT:    1. HFrEF (heart failure with reduced ejection fraction)   2. Essential hypertension, benign   3. Obesity (BMI 30-39.9)   4. Tobacco abuse     PLAN:    In order of problems listed above:  Chronic systolic CHF with reduction in EF, HFrEF TTE 10/2022 revealed EF 25 to 30%.  This was most likely due to stopping Entresto for a while. Overall euvolemic and well compensated on exam, weight stable.  Continue Entresto, carvedilol, Farxiga, Lasix, Entresto, and Aldactone. Current BP does not allow titration of GDMT. Low sodium diet, fluid restriction <2L, and daily weights encouraged. Educated to contact our office for weight gain of 2 lbs overnight or 5 lbs in one week. Heart healthy diet and regular cardiovascular exercise encouraged. Will update Echo in 3 months as unable to titrate meds further.   HTN BP today stable. BP well controlled at home.  Continue current medication regimen. Discussed to monitor BP at home at least 2 hours after medications and sitting for 5-10 minutes. Heart healthy diet and regular cardiovascular exercise encouraged.   Obesity BMI today 38.14. Heart healthy diet and regular cardiovascular exercise encouraged. Weight loss via diet and exercise encouraged. Discussed the impact being overweight would have on cardiovascular risk.  4. Tobacco abuse Smoking cessation encouraged and discussed.   She has cut down her tobacco use considerably.  I congratulated her.  Smoking cessation encouraged and discussed.  5. Disposition: Follow-up with me or [facility] in 4 months or sooner if anything changes.    Medication  Adjustments/Labs and Tests Ordered: Current medicines are reviewed at length with the patient today.  Concerns regarding medicines are outlined above.  Orders Placed This Encounter  Procedures   ECHOCARDIOGRAM COMPLETE   No orders of the defined types were placed in this encounter.   Patient Instructions  Medication Instructions:  Continue all current medications.   Labwork: none  Testing/Procedures: Your physician has requested that  you have an echocardiogram. Echocardiography is a painless test that uses sound waves to create images of your heart. It provides your doctor with information about the size and shape of your heart and how well your heart's chambers and valves are working. This procedure takes approximately one hour. There are no restrictions for this procedure. Please do NOT wear cologne, perfume, aftershave, or lotions (deodorant is allowed). Please arrive 15 minutes prior to your appointment time. DUE IN 3 MONTHS   Follow-Up: 4 months   Any Other Special Instructions Will Be Listed Below (If Applicable).   If you need a refill on your cardiac medications before your next appointment, please call your pharmacy.    Signed, Sharlene Dory, NP  04/05/2023 3:35 PM    [facility]

## 2023-04-10 ENCOUNTER — Other Ambulatory Visit: Payer: Self-pay | Admitting: *Deleted

## 2023-04-10 ENCOUNTER — Telehealth: Payer: Self-pay | Admitting: Rheumatology

## 2023-04-10 DIAGNOSIS — Z79899 Other long term (current) drug therapy: Secondary | ICD-10-CM

## 2023-04-10 NOTE — Telephone Encounter (Signed)
Patient called the office requesting lab orders be sent to [facility] in [place]. Patient states she is going tomorrow.  

## 2023-04-10 NOTE — Telephone Encounter (Signed)
Lab Orders released.  

## 2023-04-12 DIAGNOSIS — Z79899 Other long term (current) drug therapy: Secondary | ICD-10-CM | POA: Diagnosis not present

## 2023-04-12 LAB — CBC WITH DIFFERENTIAL/PLATELET
Eosinophils Relative: 0.7 %
MCH: 28.3 pg (ref 27.0–33.0)
MCV: 86.3 fL (ref 80.0–100.0)
MPV: 9.4 fL (ref 7.5–12.5)
Neutro Abs: 4402 cells/uL (ref 1500–7800)
Neutrophils Relative %: 60.3 %
RBC: 4.81 10*6/uL (ref 3.80–5.10)
WBC: 7.3 10*3/uL (ref 3.8–10.8)

## 2023-04-13 ENCOUNTER — Telehealth: Payer: Self-pay

## 2023-04-13 LAB — CBC WITH DIFFERENTIAL/PLATELET
Absolute Monocytes: 518 cells/uL (ref 200–950)
Basophils Absolute: 29 cells/uL (ref 0–200)
Basophils Relative: 0.4 %
Eosinophils Absolute: 51 cells/uL (ref 15–500)
HCT: 41.5 % (ref 35.0–45.0)
Hemoglobin: 13.6 g/dL (ref 11.7–15.5)
Lymphs Abs: 2300 cells/uL (ref 850–3900)
MCHC: 32.8 g/dL (ref 32.0–36.0)
Monocytes Relative: 7.1 %
Platelets: 269 10*3/uL (ref 140–400)
RDW: 12.9 % (ref 11.0–15.0)
Total Lymphocyte: 31.5 %

## 2023-04-13 LAB — COMPLETE METABOLIC PANEL WITH GFR
AG Ratio: 1.4 (calc) (ref 1.0–2.5)
ALT: 8 U/L (ref 6–29)
AST: 11 U/L (ref 10–35)
Albumin: 4.3 g/dL (ref 3.6–5.1)
Alkaline phosphatase (APISO): 111 U/L (ref 37–153)
BUN: 11 mg/dL (ref 7–25)
CO2: 28 mmol/L (ref 20–32)
Calcium: 9.2 mg/dL (ref 8.6–10.4)
Chloride: 106 mmol/L (ref 98–110)
Creat: 0.81 mg/dL (ref 0.50–1.05)
Globulin: 3 g/dL (calc) (ref 1.9–3.7)
Glucose, Bld: 82 mg/dL (ref 65–99)
Potassium: 4.1 mmol/L (ref 3.5–5.3)
Sodium: 143 mmol/L (ref 135–146)
Total Bilirubin: 0.7 mg/dL (ref 0.2–1.2)
Total Protein: 7.3 g/dL (ref 6.1–8.1)
eGFR: 81 mL/min/{1.73_m2} (ref >=60)

## 2023-04-13 NOTE — Telephone Encounter (Signed)
Patient contacted the office stating she has received a call this morning regarding lab results. Advised patient there is no record of a call this morning and there is no new lab results to discuss at this time.

## 2023-04-13 NOTE — Progress Notes (Signed)
CBC and CMP WNL

## 2023-04-19 ENCOUNTER — Encounter: Payer: Self-pay | Admitting: Physician Assistant

## 2023-04-19 ENCOUNTER — Ambulatory Visit: Payer: Medicare Other | Attending: Physician Assistant | Admitting: Physician Assistant

## 2023-04-19 VITALS — BP 123/85 | HR 65 | Resp 17 | Ht 65.5 in | Wt 232.4 lb

## 2023-04-19 DIAGNOSIS — Z8639 Personal history of other endocrine, nutritional and metabolic disease: Secondary | ICD-10-CM | POA: Diagnosis not present

## 2023-04-19 DIAGNOSIS — Z862 Personal history of diseases of the blood and blood-forming organs and certain disorders involving the immune mechanism: Secondary | ICD-10-CM

## 2023-04-19 DIAGNOSIS — M25511 Pain in right shoulder: Secondary | ICD-10-CM | POA: Diagnosis not present

## 2023-04-19 DIAGNOSIS — M0579 Rheumatoid arthritis with rheumatoid factor of multiple sites without organ or systems involvement: Secondary | ICD-10-CM

## 2023-04-19 DIAGNOSIS — M797 Fibromyalgia: Secondary | ICD-10-CM | POA: Diagnosis not present

## 2023-04-19 DIAGNOSIS — Z79899 Other long term (current) drug therapy: Secondary | ICD-10-CM

## 2023-04-19 DIAGNOSIS — Z8709 Personal history of other diseases of the respiratory system: Secondary | ICD-10-CM | POA: Diagnosis not present

## 2023-04-19 DIAGNOSIS — Z8669 Personal history of other diseases of the nervous system and sense organs: Secondary | ICD-10-CM | POA: Diagnosis not present

## 2023-04-19 DIAGNOSIS — M170 Bilateral primary osteoarthritis of knee: Secondary | ICD-10-CM

## 2023-04-19 DIAGNOSIS — M19042 Primary osteoarthritis, left hand: Secondary | ICD-10-CM

## 2023-04-19 DIAGNOSIS — M19072 Primary osteoarthritis, left ankle and foot: Secondary | ICD-10-CM

## 2023-04-19 DIAGNOSIS — M19071 Primary osteoarthritis, right ankle and foot: Secondary | ICD-10-CM

## 2023-04-19 DIAGNOSIS — G8929 Other chronic pain: Secondary | ICD-10-CM

## 2023-04-19 DIAGNOSIS — M19041 Primary osteoarthritis, right hand: Secondary | ICD-10-CM | POA: Diagnosis not present

## 2023-04-19 MED ORDER — METHOTREXATE SODIUM 2.5 MG PO TABS: ORAL_TABLET | ORAL | 0 refills | Status: DC

## 2023-04-19 NOTE — Patient Instructions (Signed)
Standing Labs We placed an order today for your standing lab work.   Please have your standing labs drawn in 2 weeks, 2 months, then every 3 months   Please have your labs drawn 2 weeks prior to your appointment so that the provider can discuss your lab results at your appointment, if possible.  Please note that you may see your imaging and lab results in MyChart before we have reviewed them. We will contact you once all results are reviewed. Please allow our office up to 72 hours to thoroughly review all of the results before contacting the office for clarification of your results.  WALK-IN LAB HOURS  Monday through Thursday from 8:00 am -12:30 pm and 1:00 pm-5:00 pm and Friday from 8:00 am-12:00 pm.  Patients with office visits requiring labs will be seen before walk-in labs.  You may encounter longer than normal wait times. Please allow additional time. Wait times may be shorter on  Monday and Thursday afternoons.  We do not book appointments for walk-in labs. We appreciate your patience and understanding with our staff.   Labs are drawn by [facility]. Please bring your co-pay at the time of your lab draw.  You may receive a bill from [facility] for your lab work.  Please note if you are on Hydroxychloroquine and and an order has been placed for a Hydroxychloroquine level,  you will need to have it drawn 4 hours or more after your last dose.  If you wish to have your labs drawn at another location, please call the office 24 hours in advance so we can fax the orders.  The office is located at [address], [place], [place] [address]   If you have any questions regarding directions or hours of operation,  please call [phone].   As a reminder, please drink plenty of water prior to coming for your lab work. Thanks!

## 2023-05-23 DIAGNOSIS — H10413 Chronic giant papillary conjunctivitis, bilateral: Secondary | ICD-10-CM | POA: Diagnosis not present

## 2023-05-23 DIAGNOSIS — M069 Rheumatoid arthritis, unspecified: Secondary | ICD-10-CM | POA: Diagnosis not present

## 2023-05-23 DIAGNOSIS — H04123 Dry eye syndrome of bilateral lacrimal glands: Secondary | ICD-10-CM | POA: Diagnosis not present

## 2023-05-23 DIAGNOSIS — H2513 Age-related nuclear cataract, bilateral: Secondary | ICD-10-CM | POA: Diagnosis not present

## 2023-05-23 DIAGNOSIS — Z79899 Other long term (current) drug therapy: Secondary | ICD-10-CM | POA: Diagnosis not present

## 2023-05-29 ENCOUNTER — Telehealth: Payer: Self-pay | Admitting: Physician Assistant

## 2023-05-29 ENCOUNTER — Other Ambulatory Visit: Payer: Self-pay | Admitting: *Deleted

## 2023-05-29 DIAGNOSIS — Z79899 Other long term (current) drug therapy: Secondary | ICD-10-CM

## 2023-05-29 NOTE — Telephone Encounter (Signed)
Pt is calling in to see if lab orders can be sent over to the [facility] in [place] on [address] across from [facility]  Pt would like to have a call once it has been placed.

## 2023-05-29 NOTE — Telephone Encounter (Signed)
Lab Orders released. Left message to advise patient.

## 2023-06-05 ENCOUNTER — Ambulatory Visit (INDEPENDENT_AMBULATORY_CARE_PROVIDER_SITE_OTHER): Payer: Medicare Other | Admitting: Gastroenterology

## 2023-06-05 ENCOUNTER — Encounter (INDEPENDENT_AMBULATORY_CARE_PROVIDER_SITE_OTHER): Payer: Self-pay | Admitting: Gastroenterology

## 2023-06-05 ENCOUNTER — Encounter (INDEPENDENT_AMBULATORY_CARE_PROVIDER_SITE_OTHER): Payer: Self-pay

## 2023-06-05 VITALS — BP 123/73 | HR 72 | Temp 98.4°F | Ht 65.5 in | Wt 234.0 lb

## 2023-06-05 DIAGNOSIS — R195 Other fecal abnormalities: Secondary | ICD-10-CM

## 2023-06-05 DIAGNOSIS — R1033 Periumbilical pain: Secondary | ICD-10-CM | POA: Diagnosis not present

## 2023-06-05 DIAGNOSIS — Z79899 Other long term (current) drug therapy: Secondary | ICD-10-CM | POA: Diagnosis not present

## 2023-06-05 LAB — CBC WITH DIFFERENTIAL/PLATELET
Basophils Relative: 0.5 %
HCT: 37.7 % (ref 35.0–45.0)
Hemoglobin: 12.5 g/dL (ref 11.7–15.5)
MCH: 29.7 pg (ref 27.0–33.0)
MCHC: 33.2 g/dL (ref 32.0–36.0)
MCV: 89.5 fL (ref 80.0–100.0)
Monocytes Relative: 6.4 %
Neutrophils Relative %: 62.6 %
Platelets: 228 10*3/uL (ref 140–400)
RDW: 13.5 % (ref 11.0–15.0)

## 2023-06-05 MED ORDER — DICYCLOMINE HCL 10 MG PO CAPS: ORAL_CAPSULE | ORAL | 2 refills | Status: DC

## 2023-06-05 NOTE — Progress Notes (Signed)
CBC WNL

## 2023-06-05 NOTE — Patient Instructions (Signed)
Please continue to avoid beef/pork You can continue to use bentyl 10mg  up to twice daily as needed for abdominal pain We will get a special CT to look at the blood flow to your intestines   Follow up 3 months

## 2023-06-05 NOTE — Progress Notes (Signed)
Referring Provider: [facility] Primary Care Physician:  [facility] Primary GI Physician: Levon Hedger   Chief Complaint  Patient presents with   Irritable Bowel Syndrome    Follow up on IBS with diarrhea. Reports dicyclomine has helped with diarrhea and requesting a refill.    HPI:   Caroline Mason is a 67 y.o. female with past medical history of nonischemic cardiomyopathy, FBM, obesity, depression, RA, COPD,  HTN   Patient presenting today for follow up of loose stools and post prandial abdominal pain   Last seen February 2024, at that time presented recurrent episodes of pain in her lower abdomen for the last year.Bentyl helps some to decrease her pain but does not completely control it.  having fecal urgency after she eats some food.stools are usually soft but can have 6-7 Bms per day without blood or mucus.   Recommended to check alpha gal, start xifaxan 550mg  TID x14 days, low FODMAP diet, consider CT angio A/P if no improvement.   Alpha gal showed some concern for possible allergy to beef, advised to avoid beef/pork x6 weeks to determine if any improvement occurred.    Present: She did complete the xifaxan, she thinks it helped her symptoms some.   She notes she stopped beef and pork for a while and noticed a difference in her symptoms. She tried to reintroduce these and noticed that beef causes her more abdominal pain and diarrhea. She usually stays away from these but occasionally will eat a hamburger.   She notes she has been having a flare up of her symptoms for the past few weeks with 4-5 looser stools per day. She has some discomfort usually first thing in the morning around umbilicus and a little lower. Taking bentyl seems to ease the pain, she is taking this 1-2x/day. She notes that eating brings her pain on frequently, though not everytime she eats, and seems to be only with certain foods. No rectal bleeding or melena. She had some weight loss  a while back but has gained some of this back.  She notes during times where she does not feel she is having a "flare" she has solid stools. Still has some abdominal pain during these times. She does report even sometimes drinking water brings on her pain. Pain does not wake her up at night. No rectal bleeding or melena.    Last EGD: Never Last Colonoscopy:07/2022 5 polyps cecum, ascending colon and transverse between 2 to 6 mm in size, 2 polyps between 3 to 5 mm in the descending colon, all polyps removed (five tubular adenomas and 3 SSLs), diverticulosis.   repeat colonoscopy in 3 years   Past Medical History:  Diagnosis Date   Atypical pneumonia    12/11   Cardiomyopathy    Nonischemic, normal coronaries, LVEF 40-45% >> LVEF 25-30% 10/2022   COPD (chronic obstructive pulmonary disease) (HCC)    Depression    Diverticulitis    per patient   Diverticulosis    Essential hypertension    Fibromyalgia    Morbid obesity (HCC)    Obstructive sleep apnea    Rheumatoid arthritis(714.0)     Past Surgical History:  Procedure Laterality Date   COLONOSCOPY WITH PROPOFOL N/A 07/25/2022   Procedure: COLONOSCOPY WITH PROPOFOL;  Surgeon: Dolores Frame, MD;  [facility]: [facility];  Service: Gastroenterology;  Laterality: N/A;  815   CYST REMOVAL TRUNK     POLYPECTOMY  07/25/2022   Procedure: POLYPECTOMY;  Surgeon: Levon Hedger  Alisia Ferrari, MD;  Location: [facility];  Service: Gastroenterology;;  cecal and ascending;descending   ROTATOR CUFF REPAIR Right 02/2022   TUBAL LIGATION  1989   WRIST SURGERY  2016    Current Outpatient Medications  Medication Sig Dispense Refill   amLODipine (NORVASC) 10 MG tablet Take 10 mg by mouth daily.     atorvastatin (LIPITOR) 10 MG tablet Take 10 mg by mouth daily.     carvedilol (COREG) 3.125 MG tablet TAKE 1 TABLET(3.125 MG) BY MOUTH TWICE DAILY 180 tablet 1   Cholecalciferol (VITAMIN D3 ADULT GUMMIES PO) Take 1 tablet by mouth daily.      dicyclomine (BENTYL) 10 MG capsule TAKE 1 CAPSULE(10 MG) BY MOUTH EVERY 12 HOURS AS NEEDED FOR ABDOMINAL PAIN 60 capsule 2   docusate sodium (COLACE) 100 MG capsule Take 100 mg by mouth as needed.     FARXIGA 10 MG TABS tablet TAKE 1 TABLET(10 MG) BY MOUTH DAILY BEFORE BREAKFAST 30 tablet 6   folic acid (FOLVITE) 1 MG tablet Take 2 tablets (2 mg total) by mouth daily. 180 tablet 3   furosemide (LASIX) 20 MG tablet Take 1 tablet (20 mg total) by mouth daily. 90 tablet 2   gabapentin (NEURONTIN) 300 MG capsule Take 300 mg by mouth 2 (two) times daily.     hydroxychloroquine (PLAQUENIL) 200 MG tablet TAKE 1 TABLET(200 MG) BY MOUTH TWICE DAILY 180 tablet 0   methotrexate (RHEUMATREX) 2.5 MG tablet Take 8 tabs by mouth once weekly.  Caution:Chemotherapy. Protect from light. 96 tablet 0   nicotine (NICODERM CQ - DOSED IN MG/24 HR) 7 mg/24hr patch Place 1 patch (7 mg total) onto the skin daily. 28 patch 0   sacubitril-valsartan (ENTRESTO) 49-51 MG Take 1 tablet by mouth 2 (two) times daily. Start on Thursday 11/14/18 60 tablet 6   sertraline (ZOLOFT) 100 MG tablet Take 100 mg by mouth daily.     spironolactone (ALDACTONE) 25 MG tablet TAKE 1 TABLET(25 MG) BY MOUTH DAILY 90 tablet 0   No current facility-administered medications for this visit.    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    Comment:  [employer]  Tobacco Use   Smoking status: Some Days    Packs/day: 0.25    Years: 30.00    Additional pack years: 0.00    Total pack years: 7.50    Types: Cigarettes    Start date: 06/03/1976    Passive exposure: Current   Smokeless tobacco: Never   Tobacco comments:    smokes 3-4 per day again  Vaping Use   Vaping Use: Never used  Substance and Sexual Activity   Alcohol use: No    Alcohol/week: 0.0 standard drinks of alcohol   Drug use: No  Sexual activity: Yes    Birth control/protection: None  Other Topics Concern   Not on file  Social History Narrative   Lives in [place].   Social Determinants of Health   Financial Resource Strain: Not on file  Food Insecurity: Not on file  Transportation Needs: Not on file  Physical Activity: Not on file  Stress: Not on file  Social Connections: Not on file    Review of systems General: negative for malaise, night sweats, fever, chills, weight loss Neck: Negative for lumps, goiter, pain and significant neck swelling Resp: Negative for cough, wheezing, dyspnea at rest CV: Negative for chest pain, leg swelling, palpitations, orthopnea GI: denies melena, hematochezia, nausea, vomiting,  constipation, dysphagia, odyonophagia, early satiety or unintentional weight loss. +loose stools +abdominal pain  MSK: Negative for joint pain or swelling, back pain, and muscle pain. Derm: Negative for itching or rash Psych: Denies depression, anxiety, memory loss, confusion. No homicidal or suicidal ideation.  Heme: Negative for prolonged bleeding, bruising easily, and swollen nodes. Endocrine: Negative for cold or heat intolerance, polyuria, polydipsia and goiter. Neuro: negative for tremor, gait imbalance, syncope and seizures. The remainder of the review of systems is noncontributory.  Physical Exam: BP 123/73 (BP Location: Left Arm, Patient Position: Sitting, Cuff Size: Large)   Pulse 72   Temp 98.4 F (36.9 C) (Oral)   Ht 5' 5.5"  (1.664 m)   Wt 234 lb (106.1 kg)   BMI 38.35 kg/m  General:   Alert and oriented. No distress noted. Pleasant and cooperative.  Head:  Normocephalic and atraumatic. Eyes:  Conjuctiva clear without scleral icterus. Mouth:  Oral mucosa pink and moist. Good dentition. No lesions. Heart: Normal rate and rhythm, s1 and s2 heart sounds present.  Lungs: Clear lung sounds in all lobes. Respirations equal and unlabored. Abdomen:  +BS, soft, non-tender and non-distended. No rebound or guarding. No HSM or masses noted. Derm: No palmar erythema or jaundice Msk:  Symmetrical without gross deformities. Normal posture. Extremities:  Without edema. Neurologic:  Alert and  oriented x4 Psych:  Alert and cooperative. Normal mood and affect.  Invalid input(s): "6 MONTHS"   ASSESSMENT: JOYE DORADO is a 67 y.o. female presenting today for follow up of abdominal pain and loose stools  Continue abdominal pain and loose stools, mostly post prandially. Alpha gal testing previously suggestive of allergy to pork/beef, she does note more abdominal discomfort and diarrhea if she consumes these. No rectal bleeding or melena. Abdominal pain can occur sometimes with just drinking liquids, bentyl helps some but does not totally alleviate her pain. Last TCS in late 2023 with a few polyps and diverticulosis. As she continues to have pain, more post prandially, would recommend ruling out underlying chronic mesenteric ischemia. Will proceed with CT Angio A/P for further evaluation. She can continue with bentyl 10mg  BID PRN and should avoid beef/pork in her diet.    PLAN:  Continue with bentyl 10mg  BID PRN  2. Schedule CT Angio A/P  3. Avoid Beef/Pork  All questions were answered, patient verbalized understanding and is in agreement with plan as outlined above.   Follow Up: 3 months   Kevontay Burks L. Jeanmarie Hubert, MSN, APRN, AGNP-C Adult-Gerontology Nurse Practitioner [facility]  I have reviewed  the note and agree with the APP's assessment as described in this progress note  Katrinka Blazing, MD Gastroenterology and Hepatology [facility]

## 2023-06-06 LAB — COMPLETE METABOLIC PANEL WITH GFR
AG Ratio: 1.7 (calc) (ref 1.0–2.5)
ALT: 8 U/L (ref 6–29)
AST: 10 U/L (ref 10–35)
Albumin: 4.1 g/dL (ref 3.6–5.1)
Alkaline phosphatase (APISO): 96 U/L (ref 37–153)
BUN: 12 mg/dL (ref 7–25)
CO2: 25 mmol/L (ref 20–32)
Calcium: 9.1 mg/dL (ref 8.6–10.4)
Chloride: 109 mmol/L (ref 98–110)
Creat: 0.7 mg/dL (ref 0.50–1.05)
Globulin: 2.4 g/dL (calc) (ref 1.9–3.7)
Glucose, Bld: 85 mg/dL (ref 65–99)
Potassium: 4 mmol/L (ref 3.5–5.3)
Sodium: 142 mmol/L (ref 135–146)
Total Bilirubin: 0.6 mg/dL (ref 0.2–1.2)
Total Protein: 6.5 g/dL (ref 6.1–8.1)
eGFR: 96 mL/min/{1.73_m2} (ref >=60)

## 2023-06-06 LAB — CBC WITH DIFFERENTIAL/PLATELET
Absolute Monocytes: 410 cells/uL (ref 200–950)
Basophils Absolute: 32 cells/uL (ref 0–200)
Eosinophils Absolute: 70 cells/uL (ref 15–500)
Eosinophils Relative: 1.1 %
Lymphs Abs: 1882 cells/uL (ref 850–3900)
MPV: 9.7 fL (ref 7.5–12.5)
Neutro Abs: 4006 cells/uL (ref 1500–7800)
RBC: 4.21 10*6/uL (ref 3.80–5.10)
Total Lymphocyte: 29.4 %
WBC: 6.4 10*3/uL (ref 3.8–10.8)

## 2023-06-06 NOTE — Progress Notes (Signed)
CMP WNL

## 2023-06-26 ENCOUNTER — Ambulatory Visit: Payer: Medicare Other | Admitting: Cardiology

## 2023-07-04 ENCOUNTER — Other Ambulatory Visit: Payer: Self-pay | Admitting: Physician Assistant

## 2023-07-04 NOTE — Telephone Encounter (Signed)
Last Fill: 02/21/2023 (PLQ), 04/19/2023 (MTX)  Eye exam: 05/23/2023 WNL   Labs: 06/05/2023 CBC WNL CMP WNL   Next Visit: 07/19/2023  Last Visit: 04/19/2023  [id number] arthritis with rheumatoid factor of multiple sites without organ or systems involvement   Current Dose per office note 04/19/2023:  Methotrexate 8 tablets by mouth once weekly and Plaquenil 200 mg 1 tablet by mouth twice daily   Okay to refill Plaquenil?

## 2023-07-05 ENCOUNTER — Other Ambulatory Visit: Payer: Medicare Other

## 2023-07-05 NOTE — Progress Notes (Unsigned)
Office Visit Note  Patient: Caroline Mason             Date of Birth: November 17, 1956           MRN: [id number]             PCP: [facility] Referring: [facility] Visit Date: 07/19/2023 Occupation: @GUAROCC @  Subjective:  Pain in both wrist joints   History of Present Illness: Caroline Mason is a 67 y.o. female with history of seropositive rheumatoid arthritis and osteoarthritis.  Patient remains on Methotrexate 8 tablets by mouth once weekly, folic acid 2 mg daily, and Plaquenil 200 mg 1 tablet by mouth twice daily.  She is tolerating combination therapy without any side effects.  She has not missed any doses of these medications recently.  Overall she has noticed a significant improvement in her symptoms since adding on methotrexate.  The discomfort in her left knee has improved as well as the discomfort in her right hip joint.  Patient states that she has been having increased discomfort in both wrist joints for the past 1 month.  She states that she has been performing repetitive and overuse activities especially working on the computer for prolonged periods of time which she feels exacerbates her symptoms.  She wears copper fit gloves at times as well as applies Voltaren gel topically as needed for pain relief.  She occasionally will take Tylenol if the pain is severe.  Patient states that at times the pain in her wrist wakes her up at night.  Patient had a left de Quervain's tenosynovitis injection performed in October 2023 performed by Dr. Cleophas Dunker.   She denies any recent or recurrent infections.    Activities of Daily Living:  Patient reports morning stiffness for 2 hours.   Patient Reports nocturnal pain.  Difficulty dressing/grooming: Reports Difficulty climbing stairs: Denies Difficulty getting out of chair: Denies Difficulty using hands for taps, buttons, cutlery, and/or writing: Reports  Review of Systems  Constitutional:  Positive for fatigue.   HENT:  Negative for mouth sores and mouth dryness.   Eyes:  Positive for dryness.  Respiratory:  Negative for shortness of breath.   Cardiovascular:  Negative for chest pain and palpitations.  Gastrointestinal:  Positive for constipation and diarrhea. Negative for blood in stool.  Endocrine: Positive for increased urination.  Genitourinary:  Negative for involuntary urination.  Musculoskeletal:  Positive for joint pain, joint pain, joint swelling, myalgias, muscle weakness, morning stiffness, muscle tenderness and myalgias. Negative for gait problem.  Skin:  Negative for color change, rash, hair loss and sensitivity to sunlight.  Allergic/Immunologic: Negative for susceptible to infections.  Neurological:  Positive for headaches. Negative for dizziness.  Hematological:  Negative for swollen glands.  Psychiatric/Behavioral:  Positive for depressed mood. Negative for sleep disturbance. The patient is not nervous/anxious.     PMFS History:  Patient Active Problem List   Diagnosis Date Noted   Chronic diarrhea 01/29/2023   Abdominal pain 01/29/2023   IBS (irritable bowel syndrome) 01/29/2023   Suzette Battiest disease (radial styloid tenosynovitis) 09/14/2022   Abdominal pain, left lower quadrant 06/08/2022   Loose stools 06/08/2022   Unspecified rotator cuff tear or rupture of right shoulder, not specified as traumatic 02/15/2022   Ganglion cyst of wrist, right 08/14/2018   Trigger thumb, right thumb 03/27/2018   High risk medication use 12/01/2016   Rheumatoid arthritis with rheumatoid factor of multiple sites without organ or systems involvement (HCC) 12/01/2016   Fibromyalgia  12/01/2016   Primary osteoarthritis of both hands 12/01/2016   Primary osteoarthritis of both knees 12/01/2016   Primary osteoarthritis of both feet 12/01/2016   Bradycardia 07/18/2011   Chronic systolic heart failure (HCC) 06/05/2011   ANEMIA 03/01/2011   TOBACCO ABUSE 01/03/2011   OBSTRUCTIVE SLEEP APNEA  01/03/2011   ESSENTIAL HYPERTENSION, BENIGN 01/03/2011   Secondary cardiomyopathy (HCC) 01/03/2011   COPD 01/03/2011    Past Medical History:  Diagnosis Date   Atypical pneumonia    12/11   Cardiomyopathy    Nonischemic, normal coronaries, LVEF 40-45% >> LVEF 25-30% 10/2022   COPD (chronic obstructive pulmonary disease) (HCC)    Depression    Diverticulitis    per patient   Diverticulosis    Essential hypertension    Fibromyalgia    Morbid obesity (HCC)    Obstructive sleep apnea    Rheumatoid arthritis(714.0)     Family History  Problem Relation Age of Onset   Stroke Mother    Hypertension Mother    Heart failure Sister 46   Breast cancer Sister    Heart failure Sister 68   Cancer Sister    Ovarian cancer Sister    Diabetes Maternal Grandmother    Diabetes Paternal Grandfather    Past Surgical History:  Procedure Laterality Date   COLONOSCOPY WITH PROPOFOL N/A 07/25/2022   Procedure: COLONOSCOPY WITH PROPOFOL;  Surgeon: Dolores Frame, MD;  [facility];  Service: Gastroenterology;  Laterality: N/A;  815   CYST REMOVAL TRUNK     POLYPECTOMY  07/25/2022   Procedure: POLYPECTOMY;  Surgeon: Dolores Frame, MD;  [facility]: [facility];  Service: Gastroenterology;;  cecal and ascending;descending   ROTATOR CUFF REPAIR Right 02/2022   TUBAL LIGATION  1989   WRIST SURGERY  2016   Social History   Social History Narrative   Lives in [place].   Immunization History  Administered Date(s) Administered   Influenza-Unspecified 09/24/2014, 10/25/2018   Moderna Sars-Covid-2 Vaccination 02/09/2020, 03/11/2020, 11/30/2020     Objective: Vital Signs: BP 124/80 (BP Location: Left Arm, Patient Position: Sitting, Cuff Size: Normal)   Pulse 63   Resp 14   Ht 5\' 5"  (1.651 m)   Wt 229 lb (103.9 kg)   BMI 38.11 kg/m    Physical Exam Vitals and nursing note reviewed.  Constitutional:      Appearance: She is well-developed.  HENT:      Head: Normocephalic and atraumatic.  Eyes:     Conjunctiva/sclera: Conjunctivae normal.  Cardiovascular:     Rate and Rhythm: Normal rate and regular rhythm.     Heart sounds: Normal heart sounds.  Pulmonary:     Effort: Pulmonary effort is normal.     Breath sounds: Normal breath sounds.  Abdominal:     General: Bowel sounds are normal.     Palpations: Abdomen is soft.  Musculoskeletal:     Cervical back: Normal range of motion.  Lymphadenopathy:     Cervical: No cervical adenopathy.  Skin:    General: Skin is warm and dry.     Capillary Refill: Capillary refill takes less than 2 seconds.  Neurological:     Mental Status: She is alert and oriented to person, place, and time.  Psychiatric:        Behavior: Behavior normal.      Musculoskeletal Exam: C-spine, thoracic spine, and lumbar spine good ROM.  Shoulder joints and elbow joints have good ROM.  De Quervain's tenosynovitis bilaterally.  No tenderness  or synovitis over the wrist joint noted today.  No tenderness or synovitis over MCP joints.  Complete fist formation bilaterally. Hip joints have good ROM with no groin pain.  Knee joints have good ROM with no warmth or effusion.  Ankle joints have good ROM with no tenderness or swelling.   CDAI Exam: CDAI Score: -- Patient Global: 80 / 100; Provider Global: 50 / 100 Swollen: --; Tender: -- Joint Exam 07/19/2023   No joint exam has been documented for this visit   There is currently no information documented on the homunculus. Go to the Rheumatology activity and complete the homunculus joint exam.  Investigation: No additional findings.  Imaging: No results found.  Recent Labs: Lab Results  Component Value Date   WBC 6.4 06/05/2023   HGB 12.5 06/05/2023   PLT 228 06/05/2023   NA 142 06/05/2023   K 4.0 06/05/2023   CL 109 06/05/2023   CO2 25 06/05/2023   GLUCOSE 85 06/05/2023   BUN 12 06/05/2023   CREATININE 0.70 06/05/2023   BILITOT 0.6 06/05/2023   ALKPHOS  102 12/05/2016   AST 10 06/05/2023   ALT 8 06/05/2023   PROT 6.5 06/05/2023   ALBUMIN 4.0 12/05/2016   CALCIUM 9.1 06/05/2023   GFRAA 108 09/29/2020   QFTBGOLDPLUS NEGATIVE 02/21/2023    Speciality Comments: PLQ Eye Exam: 05/23/2023 WNL [facility] follow up 1 year  Procedures:  No procedures performed Allergies: Aspirin    Assessment / Plan:     Visit Diagnoses: Rheumatoid arthritis with rheumatoid factor of multiple sites without organ or systems involvement (HCC) - +RF, synovitis: She has no synovitis on examination today.  Patient has been experiencing increased pain in both wrist joints consistent with de Quervain's tenosynovitis.  She has been wearing copper fit gloves intermittently.  Patient feels that the recurrence of de Quervain's tenosynovitis has been worsened by repetitive and overuse activities especially while working on a computer for prolonged periods of time.  Offered a referral to a Hydrographic surveyor but she has declined at this time.  Patient plans on altering her activity level as well as wearing a de Quervain's tenosynovitis brace for support.   Overall her rheumatoid arthritis has been better controlled since adding on methotrexate as combination therapy.  She is currently taking methotrexate 8 tablets by mouth once weekly, folic acid 2 mg daily, Plaquenil 200 mg 1 tablet by mouth twice daily.  She is tolerating combination therapy without any side effects and has not missed any doses recently.  No medication changes will be made at this time.  She is vies notify us if she develops signs or symptoms of a flare.  She will follow-up in the office in 3 months or sooner if needed.  High risk medication use - Methotrexate 8 tablets by mouth once weekly, folic acid 2 mg daily, and Plaquenil 200 mg 1 tablet by mouth twice daily.  CBC and CMP updated on 06/05/23-WNL.  Her next lab work will be due in September and every 3 months to monitor for drug toxicity. PLQ Eye  Exam: 05/23/2023 WNL [facility] follow up 1 year  No recent or recurrent infections.  Discussed the importance of holding methotrexate if she develops signs or symptoms of an infection and to resume once the infection has completely cleared.  Primary osteoarthritis of both hands: PIP and DIP thickening consistent with osteoarthritis of both hands.  De Quervain's tenosynovitis bilaterally.  She has been wearing copper fit  gloves as needed for pain relief.  Her wrist pain is exacerbated by overuse or repetitive activities.  Discussed the importance of joint protection and muscle strengthening.  Chronic right shoulder pain - History of complete rotator cuff tear of right shoulder-surgically repaired by Dr. Cleophas Dunker in March 2023.  Good range of motion of the right shoulder with no discomfort at this time.  Primary osteoarthritis of both knees - Left knee-Severe OA-underwent [place] in August/September 2023.  She has good range of motion of both knee joints on examination today.  No warmth or effusion noted.  She has noticed a significant improvement in her knee joint pain since adding on methotrexate as combination therapy.  Primary osteoarthritis of both feet: She is not experiencing any increased discomfort in her feet at this time.  She is good range of motion of both ankle joints with no tenderness or synovitis.  No tenderness or synovitis over MTP joints.  Fibromyalgia: She experiences intermittent myalgias and muscle tenderness due to fibromyalgia.  Discussed the importance of regular exercise and good sleep hygiene.  Other medical conditions are listed as follows:   History of COPD  History of anemia  History of vitamin D deficiency  History of sleep apnea  Orders: No orders of the defined types were placed in this encounter.  No orders of the defined types were placed in this encounter.    Follow-Up Instructions: Return in 3 months (on 10/19/2023) for Rheumatoid  arthritis, Osteoarthritis.   Gearldine Bienenstock, PA-C  Note - This record has been created using Dragon software.  Chart creation errors have been sought, but may not always  have been located. Such creation errors do not reflect on  the standard of medical care.

## 2023-07-19 ENCOUNTER — Ambulatory Visit: Payer: Medicare Other | Attending: Physician Assistant | Admitting: Physician Assistant

## 2023-07-19 ENCOUNTER — Encounter: Payer: Self-pay | Admitting: Physician Assistant

## 2023-07-19 VITALS — BP 124/80 | HR 63 | Resp 14 | Ht 65.0 in | Wt 229.0 lb

## 2023-07-19 DIAGNOSIS — Z8639 Personal history of other endocrine, nutritional and metabolic disease: Secondary | ICD-10-CM

## 2023-07-19 DIAGNOSIS — Z79899 Other long term (current) drug therapy: Secondary | ICD-10-CM | POA: Diagnosis not present

## 2023-07-19 DIAGNOSIS — Z8669 Personal history of other diseases of the nervous system and sense organs: Secondary | ICD-10-CM

## 2023-07-19 DIAGNOSIS — M170 Bilateral primary osteoarthritis of knee: Secondary | ICD-10-CM | POA: Diagnosis not present

## 2023-07-19 DIAGNOSIS — Z8709 Personal history of other diseases of the respiratory system: Secondary | ICD-10-CM

## 2023-07-19 DIAGNOSIS — Z862 Personal history of diseases of the blood and blood-forming organs and certain disorders involving the immune mechanism: Secondary | ICD-10-CM | POA: Diagnosis not present

## 2023-07-19 DIAGNOSIS — M0579 Rheumatoid arthritis with rheumatoid factor of multiple sites without organ or systems involvement: Secondary | ICD-10-CM | POA: Diagnosis not present

## 2023-07-19 DIAGNOSIS — M19041 Primary osteoarthritis, right hand: Secondary | ICD-10-CM

## 2023-07-19 DIAGNOSIS — M797 Fibromyalgia: Secondary | ICD-10-CM

## 2023-07-19 DIAGNOSIS — M19072 Primary osteoarthritis, left ankle and foot: Secondary | ICD-10-CM

## 2023-07-19 DIAGNOSIS — G8929 Other chronic pain: Secondary | ICD-10-CM

## 2023-07-19 DIAGNOSIS — M19042 Primary osteoarthritis, left hand: Secondary | ICD-10-CM

## 2023-07-19 DIAGNOSIS — M25511 Pain in right shoulder: Secondary | ICD-10-CM

## 2023-07-19 DIAGNOSIS — M19071 Primary osteoarthritis, right ankle and foot: Secondary | ICD-10-CM | POA: Diagnosis not present

## 2023-07-19 NOTE — Patient Instructions (Signed)
Standing Labs We placed an order today for your standing lab work.   Please have your standing labs drawn in September and every 3 months   Please have your labs drawn 2 weeks prior to your appointment so that the provider can discuss your lab results at your appointment, if possible.  Please note that you may see your imaging and lab results in MyChart before we have reviewed them. We will contact you once all results are reviewed. Please allow our office up to 72 hours to thoroughly review all of the results before contacting the office for clarification of your results.  WALK-IN LAB HOURS  Monday through Thursday from 8:00 am -12:30 pm and 1:00 pm-5:00 pm and Friday from 8:00 am-12:00 pm.  Patients with office visits requiring labs will be seen before walk-in labs.  You may encounter longer than normal wait times. Please allow additional time. Wait times may be shorter on  Monday and Thursday afternoons.  We do not book appointments for walk-in labs. We appreciate your patience and understanding with our staff.   Labs are drawn by [facility]. Please bring your co-pay at the time of your lab draw.  You may receive a bill from [facility] for your lab work.  Please note if you are on Hydroxychloroquine and and an order has been placed for a Hydroxychloroquine level,  you will need to have it drawn 4 hours or more after your last dose.  If you wish to have your labs drawn at another location, please call the office 24 hours in advance so we can fax the orders.  The office is located at [address], [address], [place], [place] [address]   If you have any questions regarding directions or hours of operation,  please call [phone].   As a reminder, please drink plenty of water prior to coming for your lab work. Thanks!  

## 2023-07-24 ENCOUNTER — Ambulatory Visit (HOSPITAL_COMMUNITY)
Admission: RE | Admit: 2023-07-24 | Discharge: 2023-07-24 | Disposition: A | Discharge status: Home / Self Care | Payer: Medicare Other | Source: Ambulatory Visit | Attending: Gastroenterology | Admitting: Gastroenterology

## 2023-07-24 DIAGNOSIS — R109 Unspecified abdominal pain: Secondary | ICD-10-CM | POA: Diagnosis not present

## 2023-07-24 DIAGNOSIS — I700 Atherosclerosis of aorta: Secondary | ICD-10-CM | POA: Diagnosis not present

## 2023-07-24 DIAGNOSIS — R195 Other fecal abnormalities: Secondary | ICD-10-CM | POA: Diagnosis not present

## 2023-07-24 DIAGNOSIS — K5730 Diverticulosis of large intestine without perforation or abscess without bleeding: Secondary | ICD-10-CM | POA: Diagnosis not present

## 2023-07-24 DIAGNOSIS — R1033 Periumbilical pain: Secondary | ICD-10-CM | POA: Insufficient documentation

## 2023-07-24 DIAGNOSIS — K449 Diaphragmatic hernia without obstruction or gangrene: Secondary | ICD-10-CM | POA: Diagnosis not present

## 2023-07-24 MED ORDER — IOHEXOL 350 MG/ML SOLN
100.0000 mL | Freq: Once | INTRAVENOUS | Status: AC | PRN
  Administered 2023-07-24: 75 mL via INTRAVENOUS

## 2023-08-01 ENCOUNTER — Telehealth (INDEPENDENT_AMBULATORY_CARE_PROVIDER_SITE_OTHER): Payer: Self-pay | Admitting: Gastroenterology

## 2023-08-01 ENCOUNTER — Encounter (INDEPENDENT_AMBULATORY_CARE_PROVIDER_SITE_OTHER): Payer: Self-pay | Admitting: Gastroenterology

## 2023-08-01 NOTE — Telephone Encounter (Signed)
Patient left voice mail message regarding a CT scan - please advise - [phone]

## 2023-08-02 ENCOUNTER — Other Ambulatory Visit (INDEPENDENT_AMBULATORY_CARE_PROVIDER_SITE_OTHER): Payer: Self-pay | Admitting: *Deleted

## 2023-08-02 NOTE — Telephone Encounter (Signed)
I called and spoke with patient and discussed her results of ct  and let her know she would be receiving a letter with results as well that I mailed yesterday due to not being able to get in touch with her Pt verbalized understanding and aware copy of ct was sent to pcp

## 2023-08-02 NOTE — Telephone Encounter (Signed)
Please advise. Thank you

## 2023-08-06 ENCOUNTER — Ambulatory Visit: Payer: Medicare Other | Attending: Nurse Practitioner | Admitting: Nurse Practitioner

## 2023-08-06 ENCOUNTER — Encounter: Payer: Self-pay | Admitting: Nurse Practitioner

## 2023-08-06 VITALS — BP 128/76 | HR 59 | Ht 65.0 in | Wt 227.5 lb

## 2023-08-06 DIAGNOSIS — E669 Obesity, unspecified: Secondary | ICD-10-CM

## 2023-08-06 DIAGNOSIS — I5020 Unspecified systolic (congestive) heart failure: Secondary | ICD-10-CM | POA: Diagnosis not present

## 2023-08-06 DIAGNOSIS — I10 Essential (primary) hypertension: Secondary | ICD-10-CM

## 2023-08-06 DIAGNOSIS — Z720 Tobacco use: Secondary | ICD-10-CM

## 2023-08-06 MED ORDER — ENTRESTO 49-51 MG PO TABS: 1.0000 | ORAL_TABLET | Freq: Two times a day (BID) | ORAL | 0 refills | Status: DC

## 2023-08-06 MED ORDER — DAPAGLIFLOZIN PROPANEDIOL 10 MG PO TABS: 10.0000 mg | ORAL_TABLET | Freq: Every day | ORAL | 0 refills | Status: DC

## 2023-08-06 NOTE — Patient Instructions (Signed)
Medication Instructions:  Continue all current medications.   Labwork: none  Testing/Procedures: Your physician has requested that you have a limited echocardiogram. Echocardiography is a painless test that uses sound waves to create images of your heart. It provides your doctor with information about the size and shape of your heart and how well your heart's chambers and valves are working. This procedure takes approximately one hour. There are no restrictions for this procedure. Please do NOT wear cologne, perfume, aftershave, or lotions (deodorant is allowed). Please arrive 15 minutes prior to your appointment time. Office will contact with results via phone, letter or mychart.     Follow-Up: 6 months   Any Other Special Instructions Will Be Listed Below (If Applicable).   If you need a refill on your cardiac medications before your next appointment, please call your pharmacy.

## 2023-08-06 NOTE — Addendum Note (Signed)
Addended by: Lesle Chris on: 08/06/2023 03:09 PM   Modules accepted: Orders

## 2023-08-06 NOTE — Progress Notes (Signed)
Cardiology Office Note:    Date: 08/06/2023 ID:  Caroline Mason, DOB 03/05/1956, MRN [id number] PCP:  [facility] Providers Cardiologist:  Nona Dell, MD    Referring MD: [facility]  CC: Here for CHF follow-up  History of Present Illness:    Caroline Mason is a delightful 67 y.o. female with a PMH of chronic systolic CHF, secondary CM, HTN, OSA, COPD, RA, de Quervain's disease, tobacco abuse, anemia, fibromyalgia, and diverticulosis, who presents today for scheduled follow-up.  In 2013, Echo revealed 45 to 50%, grade 1 DD, with diffuse hypokinesis, mild left atrial enlargement, was felt to be nonischemic as previous cath revealed normal coronary arteries.    Seen by Dr. Diona Browner on May 24, 2022.  She reported dyspnea with typical activities, was not fluid up. TTE 10/2022 EF to 25 to 30%, global hypokinesis of left ventricle, severely dilated left atrial size, trivial MR, otherwise no other significant valvular abnormalities. At follow-up, was discovered EF dropped d/t pt stopped taking Entresto in the early part of summer due to not being able to afford [facility], had restarted it shortly prior to office visit. Was tolerating all her medications well. Was smoking about 6 cigarettes/day.  I last saw patient on April 05, 2023.  Was doing well at the time.   Today she presents for follow-up. Doing well from a cardiac perspective. Says she has had issues regarding her RA and tendonitis, wearing hand brace along right hand. Also admits to recent IBS. Denies any chest pain, shortness of breath, palpitations, syncope, presyncope, dizziness, orthopnea, PND, swelling or significant weight changes, acute bleeding, or claudication.   ROS:   Please see the history of present illness.    All other systems reviewed and are negative.  EKGs/Labs/Other Studies Reviewed:    The following studies were reviewed today:  EKG:  EKG is not ordered  today.    Echocardiogram on November 23, 2022:  1. Left ventricular ejection fraction, by estimation, is 25 to 30%. The  left ventricle has severely decreased function. The left ventricle  demonstrates global hypokinesis. Left ventricular diastolic parameters are  indeterminate. Elevated left atrial  pressure. The average left ventricular global longitudinal strain is -14.0  %. The global longitudinal strain is abnormal.   2. Right ventricular systolic function is normal. The right ventricular  size is normal.   3. Left atrial size was severely dilated.   4. The mitral valve is normal in structure. Trivial mitral valve  regurgitation. No evidence of mitral stenosis.   5. The tricuspid valve is abnormal.   6. The aortic valve has an indeterminant number of cusps. Aortic valve  regurgitation is not visualized. No aortic stenosis is present.   7. The inferior vena cava is normal in size with greater than 50%  respiratory variability, suggesting right atrial pressure of 3 mmHg.   Comparison(s): Echocardiogram done 09/08/21 showed an EF of 45-50%.  Recent Labs: 06/05/2023: ALT 8; BUN 12; Creat 0.70; Hemoglobin 12.5; Platelets 228; Potassium 4.0; Sodium 142  Recent Lipid Panel No results found for: "CHOL", "TRIG", "HDL", "CHOLHDL", "VLDL", "LDLCALC", "LDLDIRECT"  Physical Exam:    VS:  BP 128/76   Pulse (!) 59   Ht 5\' 5"  (1.651 m)   Wt 227 lb 7.5 oz (103.2 kg)   SpO2 96%   BMI 37.85 kg/m     Wt Readings from Last 3 Encounters:  08/06/23 227 lb 7.5 oz (103.2 kg)  07/19/23 229 lb (103.9  kg)  06/05/23 234 lb (106.1 kg)    GEN: Obese, 67 y.o. female in no acute distress HEENT: Normal NECK: No JVD; No carotid bruits CARDIAC: S1/S2, RRR, no murmurs, rubs, gallops; 2+ peripheral pulses throughout, strong and equal bilaterally RESPIRATORY:  Clear to auscultation without rales, wheezing or rhonchi  ABDOMEN: Soft, non-tender, non-distended MUSCULOSKELETAL:  No edema to BLE; No  deformity, wearing hand brace along right hand  SKIN: Warm and dry NEUROLOGIC:  Alert and oriented x 3 PSYCHIATRIC:  Normal affect   ASSESSMENT & PLAN:    In order of problems listed above:   HFrEF Stage C, NYHA class I symptoms. TTE 10/2022 revealed EF 25 to 30%.  This was most likely due to stopping Entresto for a while. Overall euvolemic and well compensated on exam, weight stable.  Continue Entresto, carvedilol, Farxiga, Lasix, and Aldactone. Current BP does not allow titration of GDMT. Low sodium diet, fluid restriction <2L, and daily weights encouraged. Educated to contact our office for weight gain of 2 lbs overnight or 5 lbs in one week. Heart healthy diet and regular cardiovascular exercise encouraged. Will update limited Echo at this time to evaluate EF.   HTN BP today stable. BP well controlled at home.  Continue current medication regimen. Discussed to monitor BP at home at least 2 hours after medications and sitting for 5-10 minutes. Heart healthy diet and regular cardiovascular exercise encouraged.   Obesity Weight loss via diet and exercise encouraged. Discussed the impact being overweight would have on cardiovascular risk.  4. Tobacco abuse Smoking cessation encouraged and discussed.    5. Disposition: Follow-up with Dr. Diona Browner or APP in 6 months or sooner if anything changes.  Medication Adjustments/Labs and Tests Ordered: Current medicines are reviewed at length with the patient today.  Concerns regarding medicines are outlined above.  Orders Placed This Encounter  Procedures   ECHOCARDIOGRAM LIMITED   No orders of the defined types were placed in this encounter.   Patient Instructions  Medication Instructions:   Continue all current medications.   Labwork:  none  Testing/Procedures:  Your physician has requested that you have a limited echocardiogram. Echocardiography is a painless test that uses sound waves to create images of your heart. It provides  your doctor with information about the size and shape of your heart and how well your heart's chambers and valves are working. This procedure takes approximately one hour. There are no restrictions for this procedure. Please do NOT wear cologne, perfume, aftershave, or lotions (deodorant is allowed). Please arrive 15 minutes prior to your appointment time.  Office will contact with results via phone, letter or mychart.     Follow-Up:  6 months   Any Other Special Instructions Will Be Listed Below (If Applicable).   If you need a refill on your cardiac medications before your next appointment, please call your pharmacy.    Signed, Sharlene Dory, NP  08/06/2023 1:45 PM    [facility]

## 2023-08-21 ENCOUNTER — Ambulatory Visit: Payer: Medicare Other | Attending: Nurse Practitioner

## 2023-08-21 ENCOUNTER — Other Ambulatory Visit: Payer: Self-pay | Admitting: Cardiology

## 2023-08-21 DIAGNOSIS — I5020 Unspecified systolic (congestive) heart failure: Secondary | ICD-10-CM | POA: Diagnosis not present

## 2023-08-22 LAB — ECHOCARDIOGRAM LIMITED
Area-P 1/2: 2.13 cm2
Calc EF: 33.9 %
S' Lateral: 4.8 cm
Single Plane A2C EF: 29.1 %
Single Plane A4C EF: 35.3 %

## 2023-08-28 DIAGNOSIS — E7849 Other hyperlipidemia: Secondary | ICD-10-CM | POA: Diagnosis not present

## 2023-08-28 DIAGNOSIS — R739 Hyperglycemia, unspecified: Secondary | ICD-10-CM | POA: Diagnosis not present

## 2023-08-28 DIAGNOSIS — E559 Vitamin D deficiency, unspecified: Secondary | ICD-10-CM | POA: Diagnosis not present

## 2023-08-28 DIAGNOSIS — I5032 Chronic diastolic (congestive) heart failure: Secondary | ICD-10-CM | POA: Diagnosis not present

## 2023-08-28 DIAGNOSIS — N183 Chronic kidney disease, stage 3 unspecified: Secondary | ICD-10-CM | POA: Diagnosis not present

## 2023-09-04 DIAGNOSIS — I10 Essential (primary) hypertension: Secondary | ICD-10-CM | POA: Diagnosis not present

## 2023-09-04 DIAGNOSIS — Z1389 Encounter for screening for other disorder: Secondary | ICD-10-CM | POA: Diagnosis not present

## 2023-09-04 DIAGNOSIS — F17210 Nicotine dependence, cigarettes, uncomplicated: Secondary | ICD-10-CM | POA: Diagnosis not present

## 2023-09-04 DIAGNOSIS — M797 Fibromyalgia: Secondary | ICD-10-CM | POA: Diagnosis not present

## 2023-09-04 DIAGNOSIS — M0580 Other rheumatoid arthritis with rheumatoid factor of unspecified site: Secondary | ICD-10-CM | POA: Diagnosis not present

## 2023-09-04 DIAGNOSIS — I428 Other cardiomyopathies: Secondary | ICD-10-CM | POA: Diagnosis not present

## 2023-09-04 DIAGNOSIS — I5042 Chronic combined systolic (congestive) and diastolic (congestive) heart failure: Secondary | ICD-10-CM | POA: Diagnosis not present

## 2023-09-04 DIAGNOSIS — E7849 Other hyperlipidemia: Secondary | ICD-10-CM | POA: Diagnosis not present

## 2023-09-25 ENCOUNTER — Encounter (INDEPENDENT_AMBULATORY_CARE_PROVIDER_SITE_OTHER): Payer: Self-pay | Admitting: Gastroenterology

## 2023-09-25 ENCOUNTER — Ambulatory Visit (INDEPENDENT_AMBULATORY_CARE_PROVIDER_SITE_OTHER): Payer: Medicare Other | Admitting: Gastroenterology

## 2023-09-25 VITALS — BP 125/77 | HR 71 | Temp 98.3°F | Ht 65.0 in | Wt 225.7 lb

## 2023-09-25 DIAGNOSIS — Z91014 Allergy to mammalian meats: Secondary | ICD-10-CM | POA: Diagnosis not present

## 2023-09-25 DIAGNOSIS — R109 Unspecified abdominal pain: Secondary | ICD-10-CM | POA: Diagnosis not present

## 2023-09-25 DIAGNOSIS — R197 Diarrhea, unspecified: Secondary | ICD-10-CM | POA: Diagnosis not present

## 2023-09-25 DIAGNOSIS — K580 Irritable bowel syndrome with diarrhea: Secondary | ICD-10-CM

## 2023-09-25 DIAGNOSIS — Z91018 Allergy to other foods: Secondary | ICD-10-CM | POA: Insufficient documentation

## 2023-09-25 NOTE — Progress Notes (Signed)
Referring Provider: [facility] Primary Care Physician:  [facility] Primary GI Physician: Dr. Levon Hedger   Chief Complaint  Patient presents with   Follow-up    Patient here for a follow up denies any current gi issues.   HPI:   Caroline Mason is a 67 y.o. female with past medical history of nonischemic cardiomyopathy, FBM, obesity, depression, RA, COPD,  HTN   Patient presenting today for follow up of loose stools and post prandial abdominal pain   Previously with alpha gal testing indicative of beef allergy, given xifaxan which seemed to help some. She did stop eating beef/pork which seemed to help her symptoms as well. Tried reintroducing these and began with more abdominal pain and diarrhea.   Recommended to continue with bentyl 10mg  BID PRN, CT Angio A/P, avoid beef/pork  Present:  She states she has been staying away from beef and pork, when she eats these she has more abdominal pain and diarrhea. She note she is a picky eater and trying to cut out other trigger foods but it has been difficult. Dairy sometimes bothers her but she can usually tolerate a small amount of ice cream on occasion. On average having 1-2 BMs per day, she is taking bentyl 10mg  BID which seems to help. No rectal bleeding, melena, nausea or vomiting.  She notes that she has a flutter feeling on her left side this morning, mid abdominal area. Denies any pain. Fluttering resolved on its own.   Reports that her PCP is keeping an eye on the lesions found on her kidney on recent CT.   CT angiogram 07/24/23 does not support chronic mesenteric ischemia as a source of abdominal pain, given the relative paucity of aortic and mesenteric atherosclerosis. Aortic Atherosclerosis Small right-sided low-density renal lesions too small to characterize though most likely benign. Last EGD: Never Last Colonoscopy:07/2022 5 polyps cecum, ascending colon and transverse between 2 to 6 mm in size, 2  polyps between 3 to 5 mm in the descending colon, all polyps removed (five tubular adenomas and 3 SSLs), diverticulosis.   repeat colonoscopy in 3 years   Past Medical History:  Diagnosis Date   Atypical pneumonia    12/11   Cardiomyopathy    Nonischemic, normal coronaries, LVEF 40-45% >> LVEF 25-30% 10/2022   COPD (chronic obstructive pulmonary disease) (HCC)    Depression    Diverticulitis    per patient   Diverticulosis    Essential hypertension    Fibromyalgia    Morbid obesity (HCC)    Obstructive sleep apnea    Rheumatoid arthritis(714.0)     Past Surgical History:  Procedure Laterality Date   COLONOSCOPY WITH PROPOFOL N/A 07/25/2022   Procedure: COLONOSCOPY WITH PROPOFOL;  Surgeon: Dolores Frame, MD;  Location: [facility];  Service: Gastroenterology;  Laterality: N/A;  815   CYST REMOVAL TRUNK     POLYPECTOMY  07/25/2022   Procedure: POLYPECTOMY;  Surgeon: Marguerita Merles, Reuel Boom, MD;  Location: [facility];  Service: Gastroenterology;;  cecal and ascending;descending   ROTATOR CUFF REPAIR Right 02/2022   TUBAL LIGATION  1989   WRIST SURGERY  2016    Current Outpatient Medications  Medication Sig Dispense Refill   amLODipine (NORVASC) 10 MG tablet Take 10 mg by mouth daily.     atorvastatin (LIPITOR) 10 MG tablet Take 10 mg by mouth daily.     carvedilol (COREG) 3.125 MG tablet TAKE 1 TABLET(3.125 MG) BY MOUTH TWICE DAILY 180 tablet 1  Cholecalciferol (VITAMIN D3 ADULT GUMMIES PO) Take 1 tablet by mouth daily.     dapagliflozin propanediol (FARXIGA) 10 MG TABS tablet Take 1 tablet (10 mg total) by mouth daily. 7 tablet 0   dicyclomine (BENTYL) 10 MG capsule TAKE 1 CAPSULE(10 MG) BY MOUTH EVERY 12 HOURS AS NEEDED FOR ABDOMINAL PAIN 60 capsule 2   docusate sodium (COLACE) 100 MG capsule Take 100 mg by mouth as needed.     folic acid (FOLVITE) 1 MG tablet Take 2 tablets (2 mg total) by mouth daily. 180 tablet 3   furosemide (LASIX) 20 MG tablet  Take 1 tablet (20 mg total) by mouth daily. 90 tablet 2   gabapentin (NEURONTIN) 300 MG capsule Take 300 mg by mouth 2 (two) times daily.     hydroxychloroquine (PLAQUENIL) 200 MG tablet TAKE 1 TABLET(200 MG) BY MOUTH TWICE DAILY 180 tablet 0   methotrexate (RHEUMATREX) 2.5 MG tablet Take 8 tablets (20 mg total) by mouth once a week. 96 tablet 0   sacubitril-valsartan (ENTRESTO) 49-51 MG Take 1 tablet by mouth 2 (two) times daily. Start on Thursday 11/14/18 14 tablet 0   sertraline (ZOLOFT) 100 MG tablet Take 100 mg by mouth daily.     spironolactone (ALDACTONE) 25 MG tablet TAKE 1 TABLET(25 MG) BY MOUTH DAILY 90 tablet 0   No current facility-administered medications for this visit.    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    Comment: [employer]  Tobacco Use   Smoking status: Some Days    Current packs/day: 0.25    Average packs/day: 0.2 packs/day for 47.3 years (11.8 ttl pk-yrs)    Types: Cigarettes    Start date: 06/03/1976    Passive exposure: Current   Smokeless tobacco: Never   Tobacco comments:    smokes 3-4 per day again  Vaping Use   Vaping status: Never Used  Substance and Sexual Activity   Alcohol use: No    Alcohol/week: 0.0 standard drinks of alcohol   Drug use: No   Sexual activity: Yes    Birth control/protection: None  Other Topics Concern   Not on file   Social History Narrative   Lives in [place].   Social Determinants of Health   Financial Resource Strain: Not on file  Food Insecurity: Not on file  Transportation Needs: Not on file  Physical Activity: Not on file  Stress: Not on file  Social Connections: Not on file   Review of systems General: negative for malaise, night sweats, fever, chills, weight loss Neck: Negative for lumps, goiter, pain and significant neck swelling Resp: Negative for cough, wheezing, dyspnea at rest CV: Negative for chest pain, leg swelling, palpitations, orthopnea GI: denies melena, hematochezia, nausea, vomiting, diarrhea, constipation, dysphagia, odyonophagia, early satiety  or unintentional weight loss. +occasional abdominal pain  MSK: Negative for joint pain or swelling, back pain, and muscle pain. Derm: Negative for itching or rash Psych: Denies depression, anxiety, memory loss, confusion. No homicidal or suicidal ideation.  Heme: Negative for prolonged bleeding, bruising easily, and swollen nodes. Endocrine: Negative for cold or heat intolerance, polyuria, polydipsia and goiter. Neuro: negative for tremor, gait imbalance, syncope and seizures. The remainder of the review of systems is noncontributory.  Physical Exam: BP 125/77 (BP Location: Right Arm, Patient Position: Sitting, Cuff Size: Large)   Pulse 71   Temp 98.3 F (36.8 C) (Temporal)   Ht 5\' 5"  (1.651 m)   Wt 225 lb 11.2 oz (102.4 kg)   BMI 37.56 kg/m  General:   Alert and oriented. No distress noted. Pleasant and cooperative.  Head:  Normocephalic and atraumatic. Eyes:  Conjuctiva clear without scleral icterus. Mouth:  Oral mucosa pink and moist. Good dentition. No lesions. Heart: Normal rate and rhythm, s1 and s2 heart sounds present.  Lungs: Clear lung sounds in all lobes. Respirations equal and unlabored. Abdomen:  +BS, soft, non-tender and non-distended. No rebound or guarding. No HSM or masses noted. Derm: No palmar erythema  or jaundice Msk:  Symmetrical without gross deformities. Normal posture. Extremities:  Without edema. Neurologic:  Alert and  oriented x4 Psych:  Alert and cooperative. Normal mood and affect.  Invalid input(s): "6 MONTHS"   ASSESSMENT: Caroline Mason is a 67 y.o. female presenting today for follow up of abdominal pain and diarrhea  Abdominal pain and diarrhea felt secondary to history of alpha gal+IBS, CT angio A/P without evidence of mesenteric ischemia. She has had improvement with bentyl 10mg  and avoiding beef/meat. Feels symptoms are well controlled at this time. Should continue with current regimen avoiding beef/pork and dairy if this worsens her GI symptoms, can continue to use bentyl BID PRN. She will make me aware if she has new or worsening GI symptoms    PLAN:  Continue with bentyl 10mg  BID PRN  2. Continue to avoid beef/pork  3. Pt to make me aware of new or worsening GI symptoms   All questions were answered, patient verbalized understanding and is in agreement with plan as outlined above.   Follow Up: 1 year  Aidric Endicott L. Jeanmarie Hubert, MSN, APRN, AGNP-C Adult-Gerontology Nurse Practitioner [facility]  I have reviewed the note and agree with the APP's assessment as described in this progress note  Katrinka Blazing, MD Gastroenterology and Hepatology [facility]astro[facility]

## 2023-09-25 NOTE — Patient Instructions (Signed)
I'm glad you are feeling better Please continue to avoid beef/pork and dairy products as well if you find dairy worsens abdominal pain/diarrhea Continue with bentyl 10mg  twice daily as needed  Please make me aware of new or worsening GI issues  It was a pleasure to see you today. I want to create trusting relationships with patients and provide genuine, compassionate, and quality care. I truly value your feedback! please be on the lookout for a survey regarding your visit with me today. I appreciate your input about our visit and your time in completing this!    Janoah Menna L. Jeanmarie Hubert, MSN, APRN, AGNP-C Adult-Gerontology Nurse Practitioner [facility] Gastroenterology at [facility]

## 2023-10-03 ENCOUNTER — Other Ambulatory Visit: Payer: Self-pay | Admitting: *Deleted

## 2023-10-03 ENCOUNTER — Telehealth: Payer: Self-pay | Admitting: *Deleted

## 2023-10-03 DIAGNOSIS — Z79899 Other long term (current) drug therapy: Secondary | ICD-10-CM

## 2023-10-03 NOTE — Telephone Encounter (Signed)
Patient contacted the office requesting lab orders to be be release to [facility].   Patient plans to have labs this week.    Lab Orders released.

## 2023-10-08 ENCOUNTER — Ambulatory Visit: Payer: Medicare Other | Attending: Nurse Practitioner | Admitting: Nurse Practitioner

## 2023-10-08 ENCOUNTER — Encounter: Payer: Self-pay | Admitting: Nurse Practitioner

## 2023-10-08 VITALS — BP 128/80 | HR 82 | Ht 65.0 in | Wt 226.0 lb

## 2023-10-08 DIAGNOSIS — I10 Essential (primary) hypertension: Secondary | ICD-10-CM | POA: Diagnosis not present

## 2023-10-08 DIAGNOSIS — Z720 Tobacco use: Secondary | ICD-10-CM | POA: Diagnosis not present

## 2023-10-08 DIAGNOSIS — E669 Obesity, unspecified: Secondary | ICD-10-CM

## 2023-10-08 DIAGNOSIS — I5020 Unspecified systolic (congestive) heart failure: Secondary | ICD-10-CM | POA: Diagnosis not present

## 2023-10-08 MED ORDER — ENTRESTO 97-103 MG PO TABS: 1.0000 | ORAL_TABLET | Freq: Two times a day (BID) | ORAL | 5 refills | Status: DC

## 2023-10-08 NOTE — Progress Notes (Signed)
Cardiology Office Note:    Date: 10/08/2023 ID:  [name], DOB Feb 01, 1956, MRN [id number] PCP:  [facility] Providers Cardiologist:  Nona Dell, MD    Referring MD: [facility]  CC: Here for HFrEF follow-up  History of Present Illness:    Caroline Mason is a delightful 67 y.o. female with a PMH of chronic systolic CHF, secondary CM, HTN, OSA, COPD, RA, de Quervain's disease, tobacco abuse, anemia, fibromyalgia, and diverticulosis, who presents today for scheduled follow-up.  In 2013, Echo revealed 45 to 50%, grade 1 DD, with diffuse hypokinesis, mild left atrial enlargement, was felt to be nonischemic as previous cath revealed normal coronary arteries.    Seen by Dr. Diona Browner on May 24, 2022.  She reported dyspnea with typical activities, was not fluid up. TTE 10/2022 EF to 25 to 30%, global hypokinesis of left ventricle, severely dilated left atrial size, trivial MR, otherwise no other significant valvular abnormalities. At follow-up, was discovered EF dropped d/t pt stopped taking Entresto in the early part of summer due to not being able to afford [facility], had restarted it shortly prior to office visit. Was tolerating all her medications well. Was smoking about 6 cigarettes/day.  I last saw patient on August 06, 2023. Was doing well from a cardiac perspective. Limited Echo 07/2023 revealed EF 20-25%, normal right ventricular function, no significant change from prior study.  Today she presents for follow-up. Doing well from a cardiac perspective. SBP averaging 120's - 130's per her report. Continues to note issues regarding her RA and tendonitis. Denies any chest pain, shortness of breath, palpitations, syncope, presyncope, dizziness, orthopnea, PND, swelling or significant weight changes, acute bleeding, or claudication.  ROS:   Please see the history of present illness.    All other systems reviewed and are  negative.  EKGs/Labs/Other Studies Reviewed:    The following studies were reviewed today:  EKG:  EKG is not ordered today.    Limited echo 07/2023: 1. Limited Echo for LVEF assessment.   2. Left ventricular ejection fraction, by estimation, is 20 to 25%. The  left ventricle has severely decreased function. The left ventricle  demonstrates global hypokinesis. The left ventricular internal cavity size  was mildly dilated. There is mild left  ventricular hypertrophy. Left ventricular diastolic parameters are  consistent with Grade I diastolic dysfunction (impaired relaxation).  Consider Limited Echo with contrast to rule out LV thrombus.   3. Right ventricular systolic function is normal. The right ventricular  size is normal.   Comparison(s): No significant change from prior study.  Echocardiogram on November 23, 2022:  1. Left ventricular ejection fraction, by estimation, is 25 to 30%. The  left ventricle has severely decreased function. The left ventricle  demonstrates global hypokinesis. Left ventricular diastolic parameters are  indeterminate. Elevated left atrial  pressure. The average left ventricular global longitudinal strain is -14.0  %. The global longitudinal strain is abnormal.   2. Right ventricular systolic function is normal. The right ventricular  size is normal.   3. Left atrial size was severely dilated.   4. The mitral valve is normal in structure. Trivial mitral valve  regurgitation. No evidence of mitral stenosis.   5. The tricuspid valve is abnormal.   6. The aortic valve has an indeterminant number of cusps. Aortic valve  regurgitation is not visualized. No aortic stenosis is present.   7. The inferior vena cava is normal in size with greater than 50%  respiratory  variability, suggesting right atrial pressure of 3 mmHg.   Comparison(s): Echocardiogram done 09/08/21 showed an EF of 45-50%.  Physical Exam:    VS:  BP 128/80   Pulse 82   Ht 5\' 5"  (1.651  m)   Wt 226 lb (102.5 kg)   SpO2 96%   BMI 37.61 kg/m     Wt Readings from Last 3 Encounters:  10/08/23 226 lb (102.5 kg)  09/25/23 225 lb 11.2 oz (102.4 kg)  08/06/23 227 lb 7.5 oz (103.2 kg)    GEN: Obese, 67 y.o. female in no acute distress HEENT: Normal NECK: No JVD; No carotid bruits CARDIAC: S1/S2, RRR, no murmurs, rubs, gallops; 2+ peripheral pulses throughout, strong and equal bilaterally RESPIRATORY:  Clear to auscultation without rales, wheezing or rhonchi  MUSCULOSKELETAL:  No edema to BLE; No deformity SKIN: Warm and dry NEUROLOGIC:  Alert and oriented x 3 PSYCHIATRIC:  Normal affect   ASSESSMENT & PLAN:      HFrEF Stage C, NYHA class I symptoms.  Limited TTE 07/2023 revealed EF 20 to 25%.  Will stop amlodipine and increase Entresto to 97/103 mg twice daily.  She has upcoming labs on that or APP.  Continue carvedilol, Farxiga, spironolactone, and Lasix. Overall euvolemic and well compensated on exam, weight stable.  Low sodium diet, fluid restriction <2L, and daily weights encouraged. Educated to contact our office for weight gain of 2 lbs overnight or 5 lbs in one week. Heart healthy diet and regular cardiovascular exercise encouraged.   HTN BP today stable. BP well controlled at home.  Will discontinue amlodipine and increase Entresto to 97/103 mg twice daily.  She has upcoming labs with another APP.  Given BP log. Continue rest of medication regimen. Discussed to monitor BP at home at least 2 hours after medications and sitting for 5-10 minutes. Heart healthy diet and regular cardiovascular exercise encouraged.   Obesity Weight loss via diet and exercise encouraged. Discussed the impact being overweight would have on cardiovascular risk.  4. Tobacco abuse Smoking cessation encouraged and discussed.    5. Disposition: Follow-up with Dr. Diona Browner or APP in 8 weeks or sooner if anything changes.   Medication Adjustments/Labs and Tests Ordered: Current medicines are  reviewed at length with the patient today.  Concerns regarding medicines are outlined above.  No orders of the defined types were placed in this encounter.  Meds ordered this encounter  Medications   sacubitril-valsartan (ENTRESTO) 97-103 MG    Sig: Take 1 tablet by mouth 2 (two) times daily.    Dispense:  60 tablet    Refill:  5    Patient Instructions  Medication Instructions:  Your physician has recommended you make the following change in your medication:  Please stop amlodipine  Please increase Entresto to 97/103 Mg three times daily   Labwork: None  Testing/Procedures: None  Follow-Up: Your physician recommends that you schedule a follow-up appointment in: 8 weeks   Any Other Special Instructions Will Be Listed Below (If Applicable).  If you need a refill on your cardiac medications before your next appointment, please call your pharmacy.   Signed, Sharlene Dory, NP  10/08/2023 3:22 PM    [facility]

## 2023-10-08 NOTE — Progress Notes (Deleted)
Office Visit Note  Patient: Caroline Mason             Date of Birth: July 18, 1956           MRN: [id number]             PCP: [facility] Referring: [facility] Visit Date: 10/22/2023   Subjective:  No chief complaint on file.   History of Present Illness: Caroline Mason is a 67 y.o. female here for follow up ***   Previous HPI    No Rheumatology ROS completed.   PMFS History:  Patient Active Problem List   Diagnosis Date Noted   Allergy to alpha-gal 09/25/2023   Chronic diarrhea 01/29/2023   Abdominal pain 01/29/2023   IBS (irritable bowel syndrome) 01/29/2023   Tommi Rumps Quervain's disease (radial styloid tenosynovitis) 09/14/2022   Abdominal pain, left lower quadrant 06/08/2022   Loose stools 06/08/2022   Unspecified rotator cuff tear or rupture of right shoulder, not specified as traumatic 02/15/2022   Ganglion cyst of wrist, right 08/14/2018   Trigger thumb, right thumb 03/27/2018   High risk medication use 12/01/2016   Rheumatoid arthritis with rheumatoid factor of multiple sites without organ or systems involvement (HCC) 12/01/2016   Fibromyalgia 12/01/2016   Primary osteoarthritis of both hands 12/01/2016   Primary osteoarthritis of both knees 12/01/2016   Primary osteoarthritis of both feet 12/01/2016   Bradycardia 07/18/2011   Chronic systolic heart failure (HCC) 06/05/2011   ANEMIA 03/01/2011   TOBACCO ABUSE 01/03/2011   OBSTRUCTIVE SLEEP APNEA 01/03/2011   ESSENTIAL HYPERTENSION, BENIGN 01/03/2011   Secondary cardiomyopathy (HCC) 01/03/2011   Other specified chronic obstructive pulmonary disease (HCC) 01/03/2011    Past Medical History:  Diagnosis Date   Atypical pneumonia    12/11   Cardiomyopathy    Nonischemic, normal coronaries, LVEF 40-45% >> LVEF 25-30% 10/2022   COPD (chronic obstructive pulmonary disease) (HCC)    Depression    Diverticulitis    per patient   Diverticulosis    Essential hypertension     Fibromyalgia    Morbid obesity (HCC)    Obstructive sleep apnea    Rheumatoid arthritis(714.0)     Family History  Problem Relation Age of Onset   Stroke Mother    Hypertension Mother    Heart failure Sister 72   Breast cancer Sister    Heart failure Sister 25   Cancer Sister    Ovarian cancer Sister    Diabetes Maternal Grandmother    Diabetes Paternal Grandfather    Past Surgical History:  Procedure Laterality Date   COLONOSCOPY WITH PROPOFOL N/A 07/25/2022   Procedure: COLONOSCOPY WITH PROPOFOL;  Surgeon: Dolores Frame, MD;  Location: [facility];  Service: Gastroenterology;  Laterality: N/A;  815   CYST REMOVAL TRUNK     POLYPECTOMY  07/25/2022   Procedure: POLYPECTOMY;  Surgeon: Dolores Frame, MD;  Location: [facility];  Service: Gastroenterology;;  cecal and ascending;descending   ROTATOR CUFF REPAIR Right 02/2022   TUBAL LIGATION  1989   WRIST SURGERY  2016   Social History   Social History Narrative   Lives in [place].   Immunization History  Administered Date(s) Administered   Influenza-Unspecified 09/24/2014, 10/25/2018   Moderna Sars-Covid-2 Vaccination 02/09/2020, 03/11/2020, 11/30/2020     Objective: Vital Signs: There were no vitals taken for this visit.   Physical Exam   Musculoskeletal Exam: ***  CDAI Exam: CDAI Score: -- Patient Global: --; Provider Global: --  Swollen: --; Tender: -- Joint Exam 10/22/2023   No joint exam has been documented for this visit   There is currently no information documented on the homunculus. Go to the Rheumatology activity and complete the homunculus joint exam.  Investigation: No additional findings.  Imaging: No results found.  Recent Labs: Lab Results  Component Value Date   WBC 6.4 06/05/2023   HGB 12.5 06/05/2023   PLT 228 06/05/2023   NA 142 06/05/2023   K 4.0 06/05/2023   CL 109 06/05/2023   CO2 25 06/05/2023   GLUCOSE 85 06/05/2023   BUN 12 06/05/2023    CREATININE 0.70 06/05/2023   BILITOT 0.6 06/05/2023   ALKPHOS 102 12/05/2016   AST 10 06/05/2023   ALT 8 06/05/2023   PROT 6.5 06/05/2023   ALBUMIN 4.0 12/05/2016   CALCIUM 9.1 06/05/2023   GFRAA 108 09/29/2020   QFTBGOLDPLUS NEGATIVE 02/21/2023    Speciality Comments: PLQ Eye Exam: 05/23/2023 WNL [facility] follow up 1 year  Procedures:  No procedures performed Allergies: Aspirin   Assessment / Plan:     Visit Diagnoses: No diagnosis found.  ***  Orders: No orders of the defined types were placed in this encounter.  No orders of the defined types were placed in this encounter.    Follow-Up Instructions: No follow-ups on file.   Metta Clines, RT  Note - This record has been created using [employer].  Chart creation errors have been sought, but may not always  have been located. Such creation errors do not reflect on  the standard of medical care.

## 2023-10-08 NOTE — Patient Instructions (Addendum)
Medication Instructions:  Your physician has recommended you make the following change in your medication:  Please stop amlodipine  Please increase Entresto to 97/103 Mg Twice daily   Labwork: None  Testing/Procedures: None  Follow-Up: Your physician recommends that you schedule a follow-up appointment in: 8 weeks   Any Other Special Instructions Will Be Listed Below (If Applicable).  If you need a refill on your cardiac medications before your next appointment, please call your pharmacy.

## 2023-10-10 ENCOUNTER — Other Ambulatory Visit: Payer: Self-pay | Admitting: Physician Assistant

## 2023-10-22 ENCOUNTER — Ambulatory Visit: Payer: Medicare Other | Admitting: Internal Medicine

## 2023-10-25 DIAGNOSIS — Z1231 Encounter for screening mammogram for malignant neoplasm of breast: Secondary | ICD-10-CM | POA: Diagnosis not present

## 2023-10-31 ENCOUNTER — Telehealth: Payer: Self-pay | Admitting: Rheumatology

## 2023-10-31 ENCOUNTER — Ambulatory Visit: Payer: Medicare Other | Attending: Internal Medicine | Admitting: Physician Assistant

## 2023-10-31 ENCOUNTER — Other Ambulatory Visit: Payer: Self-pay

## 2023-10-31 ENCOUNTER — Encounter: Payer: Self-pay | Admitting: Physician Assistant

## 2023-10-31 VITALS — BP 132/85 | HR 65 | Resp 14 | Ht 65.0 in | Wt 225.0 lb

## 2023-10-31 DIAGNOSIS — M19041 Primary osteoarthritis, right hand: Secondary | ICD-10-CM | POA: Diagnosis not present

## 2023-10-31 DIAGNOSIS — M0579 Rheumatoid arthritis with rheumatoid factor of multiple sites without organ or systems involvement: Secondary | ICD-10-CM

## 2023-10-31 DIAGNOSIS — Z8669 Personal history of other diseases of the nervous system and sense organs: Secondary | ICD-10-CM

## 2023-10-31 DIAGNOSIS — M19042 Primary osteoarthritis, left hand: Secondary | ICD-10-CM

## 2023-10-31 DIAGNOSIS — M654 Radial styloid tenosynovitis [de Quervain]: Secondary | ICD-10-CM | POA: Diagnosis not present

## 2023-10-31 DIAGNOSIS — Z79899 Other long term (current) drug therapy: Secondary | ICD-10-CM

## 2023-10-31 DIAGNOSIS — M25511 Pain in right shoulder: Secondary | ICD-10-CM | POA: Diagnosis not present

## 2023-10-31 DIAGNOSIS — M19071 Primary osteoarthritis, right ankle and foot: Secondary | ICD-10-CM

## 2023-10-31 DIAGNOSIS — Z8639 Personal history of other endocrine, nutritional and metabolic disease: Secondary | ICD-10-CM | POA: Diagnosis not present

## 2023-10-31 DIAGNOSIS — Z8709 Personal history of other diseases of the respiratory system: Secondary | ICD-10-CM | POA: Diagnosis not present

## 2023-10-31 DIAGNOSIS — G8929 Other chronic pain: Secondary | ICD-10-CM

## 2023-10-31 DIAGNOSIS — Z862 Personal history of diseases of the blood and blood-forming organs and certain disorders involving the immune mechanism: Secondary | ICD-10-CM | POA: Diagnosis not present

## 2023-10-31 DIAGNOSIS — M797 Fibromyalgia: Secondary | ICD-10-CM | POA: Diagnosis not present

## 2023-10-31 DIAGNOSIS — M170 Bilateral primary osteoarthritis of knee: Secondary | ICD-10-CM | POA: Diagnosis not present

## 2023-10-31 DIAGNOSIS — M19072 Primary osteoarthritis, left ankle and foot: Secondary | ICD-10-CM

## 2023-10-31 MED ORDER — METHOTREXATE SODIUM 2.5 MG PO TABS: 20.0000 mg | ORAL_TABLET | ORAL | 0 refills | Status: DC

## 2023-10-31 MED ORDER — HYDROXYCHLOROQUINE SULFATE 200 MG PO TABS: ORAL_TABLET | ORAL | 0 refills | Status: DC

## 2023-10-31 NOTE — Patient Instructions (Signed)

## 2023-10-31 NOTE — Progress Notes (Signed)
CBC WNL

## 2023-10-31 NOTE — Telephone Encounter (Signed)
-----   Message from [facility] Marissa G sent at 10/31/2023  3:37 PM EST ----- Please apply for left knee visco, per Sherron Ales, PA-C. Thanks!

## 2023-10-31 NOTE — Telephone Encounter (Signed)
Please review and sign pended PLQ and methotrexate refills. Thanks!

## 2023-11-01 ENCOUNTER — Other Ambulatory Visit: Payer: Self-pay | Admitting: Cardiology

## 2023-11-01 LAB — CBC WITH DIFFERENTIAL/PLATELET
Absolute Lymphocytes: 1848 {cells}/uL (ref 850–3900)
Absolute Monocytes: 567 {cells}/uL (ref 200–950)
Basophils Absolute: 28 {cells}/uL (ref 0–200)
Basophils Relative: 0.4 %
Eosinophils Absolute: 63 {cells}/uL (ref 15–500)
Eosinophils Relative: 0.9 %
HCT: 41.4 % (ref 35.0–45.0)
Hemoglobin: 13.8 g/dL (ref 11.7–15.5)
MCH: 29.9 pg (ref 27.0–33.0)
MCHC: 33.3 g/dL (ref 32.0–36.0)
MCV: 89.8 fL (ref 80.0–100.0)
MPV: 9.7 fL (ref 7.5–12.5)
Monocytes Relative: 8.1 %
Neutro Abs: 4494 {cells}/uL (ref 1500–7800)
Neutrophils Relative %: 64.2 %
Platelets: 241 10*3/uL (ref 140–400)
RBC: 4.61 10*6/uL (ref 3.80–5.10)
RDW: 12.6 % (ref 11.0–15.0)
Total Lymphocyte: 26.4 %
WBC: 7 10*3/uL (ref 3.8–10.8)

## 2023-11-01 LAB — COMPLETE METABOLIC PANEL WITH GFR
AG Ratio: 1.6 (calc) (ref 1.0–2.5)
ALT: 7 U/L (ref 6–29)
AST: 11 U/L (ref 10–35)
Albumin: 4.2 g/dL (ref 3.6–5.1)
Alkaline phosphatase (APISO): 106 U/L (ref 37–153)
BUN: 12 mg/dL (ref 7–25)
CO2: 24 mmol/L (ref 20–32)
Calcium: 8.9 mg/dL (ref 8.6–10.4)
Chloride: 108 mmol/L (ref 98–110)
Creat: 0.76 mg/dL (ref 0.50–1.05)
Globulin: 2.6 g/dL (ref 1.9–3.7)
Glucose, Bld: 78 mg/dL (ref 65–99)
Potassium: 3.9 mmol/L (ref 3.5–5.3)
Sodium: 143 mmol/L (ref 135–146)
Total Bilirubin: 0.8 mg/dL (ref 0.2–1.2)
Total Protein: 6.8 g/dL (ref 6.1–8.1)
eGFR: 87 mL/min/{1.73_m2} (ref >=60)

## 2023-11-01 NOTE — Progress Notes (Signed)
CMP WNL

## 2023-11-14 NOTE — Telephone Encounter (Signed)
Patient is aware that we will be applying for her visco injection after 12/26/23.

## 2023-11-20 ENCOUNTER — Ambulatory Visit: Payer: Medicare Other | Attending: Rheumatology | Admitting: Rheumatology

## 2023-11-20 ENCOUNTER — Ambulatory Visit (INDEPENDENT_AMBULATORY_CARE_PROVIDER_SITE_OTHER): Payer: Medicare Other

## 2023-11-20 ENCOUNTER — Ambulatory Visit: Payer: Medicare Other

## 2023-11-20 DIAGNOSIS — M19042 Primary osteoarthritis, left hand: Secondary | ICD-10-CM

## 2023-11-20 DIAGNOSIS — M19041 Primary osteoarthritis, right hand: Secondary | ICD-10-CM

## 2023-11-20 DIAGNOSIS — M654 Radial styloid tenosynovitis [de Quervain]: Secondary | ICD-10-CM | POA: Diagnosis not present

## 2023-11-20 MED ORDER — TRIAMCINOLONE ACETONIDE 40 MG/ML IJ SUSP
20.0000 mg | INTRAMUSCULAR | Status: AC | PRN
Start: 2023-11-20 — End: 2023-11-20
  Administered 2023-11-20: 20 mg

## 2023-11-20 MED ORDER — LIDOCAINE HCL 1 % IJ SOLN
0.5000 mL | INTRAMUSCULAR | Status: AC | PRN
Start: 2023-11-20 — End: 2023-11-20
  Administered 2023-11-20: .5 mL

## 2023-11-20 NOTE — Patient Instructions (Signed)
De Quervain's Tenosynovitis  De Quervain's tenosynovitis is a condition that causes inflammation of the tendon on the thumb side of the wrist. Tendons are cords of tissue that connect bones to muscles. The tendons in the hand pass through a tunnel called a sheath. A slippery layer of tissue (synovium) lets the tendons move smoothly in the sheath. With de Quervain's tenosynovitis, the sheath swells or thickens, causing friction and pain. The condition is also called de Quervain's disease and de Quervain's syndrome. It occurs most often in women who are 25-62 years old. What are the causes? The exact cause of this condition is not known. It may be associated with overuse of the hand and wrist. What increases the risk? You are more likely to develop this condition if you: Use your hands far more than normal, especially if you repeat certain movements that involve twisting your hand or using a tight grip. Are pregnant. Are a middle-aged woman. Have rheumatoid arthritis. Have diabetes. What are the signs or symptoms? The main symptom of this condition is pain on the thumb side of the wrist. The pain may get worse when you grasp something or turn your wrist. Other symptoms may include: Pain that extends up the forearm. Swelling of your wrist and hand. Trouble moving the thumb and wrist. A sensation of snapping in the wrist. A bump filled with fluid (cyst) in the area of the pain. How is this diagnosed? This condition may be diagnosed based on: Your symptoms and medical history. A physical exam. During the exam, your health care provider may do a simple test Lourena Simmonds test) that involves pulling your thumb and wrist to see if this causes pain. You may also need to have an X-ray or ultrasound. How is this treated? Treatment for this condition may include: Avoiding any activity that causes pain and swelling. Taking medicines. Anti-inflammatory medicines and corticosteroid injections may be used  to reduce inflammation and relieve pain. Wearing a splint. Having surgery. This may be needed if other treatments do not work. Once the pain and swelling have gone down, you may start: Physical therapy. This includes exercises to improve movement and strength in your wrist and thumb. Occupational therapy. This includes adjusting how you move your wrist. Follow these instructions at home: If you have a splint: Wear the splint as told by your health care provider. Remove it only as told by your health care provider. Loosen the splint if your fingers tingle, become numb, or turn cold and blue. Keep the splint clean. If the splint is not waterproof: Do not let it get wet. Cover it with a watertight covering when you take a bath or a shower. Managing pain, stiffness, and swelling  Avoid movements and activities that cause pain and swelling in the wrist area. If directed, put ice on the painful area. This may be helpful after doing activities that involve the sore wrist. To do this: Put ice in a plastic bag. Place a towel between your skin and the bag. Leave the ice on for 20 minutes, 2-3 times a day. Remove the ice if your skin turns bright red. This is very important. If you cannot feel pain, heat, or cold, you have a greater risk of damage to the area. Move your fingers often to reduce stiffness and swelling. Raise (elevate) the injured area above the level of your heart while you are sitting or lying down. General instructions Return to your normal activities as told by your health care provider. Ask your  health care provider what activities are safe for you. Take over-the-counter and prescription medicines only as told by your health care provider. Keep all follow-up visits. This is important. Contact a health care provider if: Your pain medicine does not help. Your pain gets worse. You develop new symptoms. Summary De Quervain's tenosynovitis is a condition that causes inflammation  of the tendon on the thumb side of the wrist. The condition occurs most often in women who are 82-31 years old. The exact cause of this condition is not known. It may be associated with overuse of the hand and wrist. Treatment starts with avoiding activity that causes pain or swelling in the wrist area. Other treatments may include wearing a splint and taking medicine. Sometimes, surgery is needed. This information is not intended to replace advice given to you by your health care provider. Make sure you discuss any questions you have with your health care provider. Document Revised: 03/23/2020 Document Reviewed: 03/24/2020 Elsevier Patient Education  2024 [employer].

## 2023-11-20 NOTE — Progress Notes (Signed)
   Procedure Note  Patient: Caroline Mason             Date of Birth: [birth year]           MRN: [id number]             Visit Date: 11/20/2023  Procedures: Visit Diagnoses:  1. De Quervain's disease (radial styloid tenosynovitis)   2. Primary osteoarthritis of both hands     Hand/UE Inj: bilateral extensor compartment 1 for de Quervain's tenosynovitis on 11/20/2023 1:04 PM Indications: pain and tendon swelling Details: 27 G needle, ultrasound-guided radial approach Medications (Right): 0.5 mL lidocaine 1 %; 20 mg triamcinolone acetonide 40 MG/ML Aspirate (Right): 0 mL Medications (Left): 0.5 mL lidocaine 1 %; 20 mg triamcinolone acetonide 40 MG/ML Aspirate (Left): 0 mL Outcome: tolerated well, no immediate complications Procedure, treatment alternatives, risks and benefits explained, specific risks discussed. Consent was given by the patient. Immediately prior to procedure a time out was called to verify the correct patient, procedure, equipment, support staff and site/side marked as required. Patient was prepped and draped in the usual sterile fashion.     Postprocedure instructions were given.  Use of brace was advised.  Pollyann Savoy, MD

## 2023-12-07 ENCOUNTER — Other Ambulatory Visit: Payer: Self-pay | Admitting: Cardiology

## 2023-12-10 ENCOUNTER — Encounter: Payer: Self-pay | Admitting: Nurse Practitioner

## 2023-12-10 ENCOUNTER — Ambulatory Visit: Payer: Medicare Other | Attending: Nurse Practitioner | Admitting: Nurse Practitioner

## 2023-12-10 VITALS — BP 122/72 | HR 64 | Ht 65.0 in | Wt 227.0 lb

## 2023-12-10 DIAGNOSIS — E782 Mixed hyperlipidemia: Secondary | ICD-10-CM | POA: Diagnosis not present

## 2023-12-10 DIAGNOSIS — I10 Essential (primary) hypertension: Secondary | ICD-10-CM

## 2023-12-10 DIAGNOSIS — I5020 Unspecified systolic (congestive) heart failure: Secondary | ICD-10-CM

## 2023-12-10 DIAGNOSIS — Z720 Tobacco use: Secondary | ICD-10-CM | POA: Diagnosis not present

## 2023-12-10 DIAGNOSIS — I5022 Chronic systolic (congestive) heart failure: Secondary | ICD-10-CM

## 2023-12-10 DIAGNOSIS — E669 Obesity, unspecified: Secondary | ICD-10-CM | POA: Diagnosis not present

## 2023-12-10 DIAGNOSIS — Z79899 Other long term (current) drug therapy: Secondary | ICD-10-CM | POA: Diagnosis not present

## 2023-12-10 NOTE — Progress Notes (Signed)
Cardiology Office Note:   Date: 12/10/2023 ID:  CELESTER Mason, DOB Nov 25, 1956, MRN [id number] PCP:  P[facility] Providers Cardiologist:  Nona Dell, MD    Referring MD: [facility]  CC: Here for HFrEF follow-up  History of Present Illness:    Caroline Mason is a delightful 67 y.o. female with a PMH of chronic systolic CHF, secondary CM, HTN, OSA, COPD, RA, de Quervain's disease, tobacco abuse, anemia, fibromyalgia, mixed HLD, and diverticulosis, who presents today for scheduled follow-up.  In 2013, Echo revealed 45 to 50%, grade 1 DD, with diffuse hypokinesis, mild left atrial enlargement, was felt to be nonischemic as previous cath revealed normal coronary arteries.    Seen by Dr. Diona Browner on May 24, 2022.  She reported dyspnea with typical activities, was not fluid up. TTE 10/2022 EF to 25 to 30%, global hypokinesis of left ventricle, severely dilated left atrial size, trivial MR, otherwise no other significant valvular abnormalities. At follow-up, was discovered EF dropped d/t pt stopped taking Entresto in the early part of summer due to not being able to afford [facility], had restarted it shortly prior to office visit. Was tolerating all her medications well. Was smoking about 6 cigarettes/day.  I last saw patient on October 08, 2023.  Was doing well from a cardiac perspective.   Today she presents for follow-up. Doing well and tolerating her medications well. Denies any acute cardiac complaints or issues. Denies any chest pain, shortness of breath, palpitations, syncope, presyncope, dizziness, orthopnea, PND, swelling or significant weight changes, acute bleeding, or claudication.  ROS:   Please see the history of present illness.    All other systems reviewed and are negative.  EKGs/Labs/Other Studies Reviewed:    The following studies were reviewed today:  EKG:   EKG Interpretation Date/Time:  Monday December 10 2023  14:11:30 EST Ventricular Rate:  69 PR Interval:  182 QRS Duration:  92 QT Interval:  418 QTC Calculation: 447 R Axis:   16  Text Interpretation: Normal sinus rhythm Possible Anterolateral infarct , age undetermined No previous ECGs available Confirmed by Sharlene Dory [phone]) on 12/10/2023 2:29:57 PM   Limited echo 07/2023: 1. Limited Echo for LVEF assessment.   2. Left ventricular ejection fraction, by estimation, is 20 to 25%. The  left ventricle has severely decreased function. The left ventricle  demonstrates global hypokinesis. The left ventricular internal cavity size  was mildly dilated. There is mild left  ventricular hypertrophy. Left ventricular diastolic parameters are  consistent with Grade I diastolic dysfunction (impaired relaxation).  Consider Limited Echo with contrast to rule out LV thrombus.   3. Right ventricular systolic function is normal. The right ventricular  size is normal.   Comparison(s): No significant change from prior study.  Echocardiogram on November 23, 2022:  1. Left ventricular ejection fraction, by estimation, is 25 to 30%. The  left ventricle has severely decreased function. The left ventricle  demonstrates global hypokinesis. Left ventricular diastolic parameters are  indeterminate. Elevated left atrial  pressure. The average left ventricular global longitudinal strain is -14.0  %. The global longitudinal strain is abnormal.   2. Right ventricular systolic function is normal. The right ventricular  size is normal.   3. Left atrial size was severely dilated.   4. The mitral valve is normal in structure. Trivial mitral valve  regurgitation. No evidence of mitral stenosis.   5. The tricuspid valve is abnormal.   6. The aortic valve has an indeterminant  number of cusps. Aortic valve  regurgitation is not visualized. No aortic stenosis is present.   7. The inferior vena cava is normal in size with greater than 50%  respiratory variability,  suggesting right atrial pressure of 3 mmHg.   Comparison(s): Echocardiogram done 09/08/21 showed an EF of 45-50%.  Physical Exam:    VS:  BP 122/72   Pulse 64   Ht 5\' 5"  (1.651 m)   Wt 227 lb (103 kg)   SpO2 96%   BMI 37.77 kg/m     Wt Readings from Last 3 Encounters:  12/10/23 227 lb (103 kg)  10/31/23 225 lb (102.1 kg)  10/08/23 226 lb (102.5 kg)    GEN: Obese, 67 y.o. female in no acute distress HEENT: Normal NECK: No JVD; No carotid bruits CARDIAC: S1/S2, RRR, no murmurs, rubs, gallops; 2+ peripheral pulses throughout, strong and equal bilaterally RESPIRATORY:  Clear to auscultation without rales, wheezing or rhonchi  MUSCULOSKELETAL:  No edema to BLE; No deformity SKIN: Warm and dry NEUROLOGIC:  Alert and oriented x 3 PSYCHIATRIC:  Normal affect   ASSESSMENT & PLAN:     HFrEF Stage C, NYHA class I symptoms.  Limited TTE 07/2023 revealed EF 20 to 25%.  Continue carvedilol, Farxiga, spironolactone, Entresto, and Lasix. Overall euvolemic and well compensated on exam, weight stable.  Low sodium diet, fluid restriction <2L, and daily weights encouraged. Educated to contact our office for weight gain of 2 lbs overnight or 5 lbs in one week. Heart healthy diet and regular cardiovascular exercise encouraged. Will update Echo in 2 months.   HTN BP today stable. BP well controlled at home. Continue current medication regimen. Discussed to monitor BP at home at least 2 hours after medications and sitting for 5-10 minutes. Heart healthy diet and regular cardiovascular exercise encouraged.   Obesity Weight loss via diet and exercise encouraged. Discussed the impact being overweight would have on cardiovascular risk.  4. Tobacco abuse Smoking cessation encouraged and discussed.    5. Mixed HLD No recent labs on file, not on any lipid lowering medications at this time, has previously been on Lipitor. Will obtain FLP/LFT. Heart healthy diet and regular cardiovascular exercise  encouraged.   5. Disposition: Follow-up with Dr. Diona Browner or APP in 3 months or sooner if anything changes.   Medication Adjustments/Labs and Tests Ordered: Current medicines are reviewed at length with the patient today.  Concerns regarding medicines are outlined above.  Orders Placed This Encounter  Procedures   Lipid Profile   Hepatic function panel   EKG 12-Lead   ECHOCARDIOGRAM COMPLETE   No orders of the defined types were placed in this encounter.   Patient Instructions  Medication Instructions:  Your physician recommends that you continue on your current medications as directed. Please refer to the Current Medication list given to you today.  Labwork: In 2 months   Testing/Procedures: Your physician has requested that you have an echocardiogram. Echocardiography is a painless test that uses sound waves to create images of your heart. It provides your doctor with information about the size and shape of your heart and how well your heart's chambers and valves are working. This procedure takes approximately one hour. There are no restrictions for this procedure. Please do NOT wear cologne, perfume, aftershave, or lotions (deodorant is allowed). Please arrive 15 minutes prior to your appointment time.  Please note: We ask at that you not bring children with you during ultrasound (echo/ vascular) testing. Due to room size and  safety concerns, children are not allowed in the ultrasound rooms during exams. Our front office staff cannot provide observation of children in our lobby area while testing is being conducted. An adult accompanying a patient to their appointment will only be allowed in the ultrasound room at the discretion of the ultrasound technician under special circumstances. We apologize for any inconvenience.  Follow-Up: Your physician recommends that you schedule a follow-up appointment in: 3 months   Any Other Special Instructions Will Be Listed Below (If  Applicable).  If you need a refill on your cardiac medications before your next appointment, please call your pharmacy.   Signed, Sharlene Dory, NP

## 2023-12-10 NOTE — Patient Instructions (Addendum)
Medication Instructions:  Your physician recommends that you continue on your current medications as directed. Please refer to the Current Medication list given to you today.  Labwork: In 2 months   Testing/Procedures: Your physician has requested that you have an echocardiogram. Echocardiography is a painless test that uses sound waves to create images of your heart. It provides your doctor with information about the size and shape of your heart and how well your heart's chambers and valves are working. This procedure takes approximately one hour. There are no restrictions for this procedure. Please do NOT wear cologne, perfume, aftershave, or lotions (deodorant is allowed). Please arrive 15 minutes prior to your appointment time.  Please note: We ask at that you not bring children with you during ultrasound (echo/ vascular) testing. Due to room size and safety concerns, children are not allowed in the ultrasound rooms during exams. Our front office staff cannot provide observation of children in our lobby area while testing is being conducted. An adult accompanying a patient to their appointment will only be allowed in the ultrasound room at the discretion of the ultrasound technician under special circumstances. We apologize for any inconvenience.  Follow-Up: Your physician recommends that you schedule a follow-up appointment in: 3 months   Any Other Special Instructions Will Be Listed Below (If Applicable).  If you need a refill on your cardiac medications before your next appointment, please call your pharmacy.

## 2023-12-20 ENCOUNTER — Other Ambulatory Visit (INDEPENDENT_AMBULATORY_CARE_PROVIDER_SITE_OTHER): Payer: Self-pay | Admitting: Gastroenterology

## 2023-12-20 DIAGNOSIS — R195 Other fecal abnormalities: Secondary | ICD-10-CM

## 2023-12-27 NOTE — Telephone Encounter (Signed)
 VOB submitted for Synvisc, Left knee(s) BV pending

## 2024-01-01 NOTE — Telephone Encounter (Signed)
 Please call to schedule visco injections.  Approved for Synvisc, Left knee(s). [employer] has been met $20 co-pay  Authorization [id number] Approval date:  12/27/23 to 12/26/24

## 2024-01-08 NOTE — Telephone Encounter (Signed)
 LMOM for patient to schedule Synvisc injections for her left knee

## 2024-02-01 DIAGNOSIS — N281 Cyst of kidney, acquired: Secondary | ICD-10-CM | POA: Diagnosis not present

## 2024-02-05 ENCOUNTER — Ambulatory Visit: Payer: Medicare Other | Admitting: Cardiology

## 2024-02-06 ENCOUNTER — Other Ambulatory Visit: Payer: Self-pay | Admitting: Physician Assistant

## 2024-02-06 DIAGNOSIS — Z79899 Other long term (current) drug therapy: Secondary | ICD-10-CM

## 2024-02-06 NOTE — Telephone Encounter (Signed)
Last Fill: 10/31/2023  Labs: 10/31/2023 CBC WNL CMP WNL   Next Visit: 03/31/2024  Last Visit: 10/31/2023  DX: Rheumatoid arthritis with rheumatoid factor of multiple sites without organ or systems involvement   Current Dose per office note 10/31/2023: Methotrexate 8 tablets by mouth once weekly   Patient advised she is due to update her lab work. Patient plans on going this week to update.   Okay to refill Methotrexate?

## 2024-02-11 DIAGNOSIS — Z79899 Other long term (current) drug therapy: Secondary | ICD-10-CM | POA: Diagnosis not present

## 2024-02-12 ENCOUNTER — Other Ambulatory Visit: Payer: Medicare Other

## 2024-02-12 LAB — CBC WITH DIFFERENTIAL/PLATELET
Absolute Lymphocytes: 2447 {cells}/uL (ref 850–3900)
Absolute Monocytes: 684 {cells}/uL (ref 200–950)
Basophils Absolute: 38 {cells}/uL (ref 0–200)
Basophils Relative: 0.5 %
Eosinophils Absolute: 61 {cells}/uL (ref 15–500)
Eosinophils Relative: 0.8 %
HCT: 43.5 % (ref 35.0–45.0)
Hemoglobin: 14.3 g/dL (ref 11.7–15.5)
MCH: 29.7 pg (ref 27.0–33.0)
MCHC: 32.9 g/dL (ref 32.0–36.0)
MCV: 90.2 fL (ref 80.0–100.0)
MPV: 9.8 fL (ref 7.5–12.5)
Monocytes Relative: 9 %
Neutro Abs: 4370 {cells}/uL (ref 1500–7800)
Neutrophils Relative %: 57.5 %
Platelets: 258 10*3/uL (ref 140–400)
RBC: 4.82 10*6/uL (ref 3.80–5.10)
RDW: 13.7 % (ref 11.0–15.0)
Total Lymphocyte: 32.2 %
WBC: 7.6 10*3/uL (ref 3.8–10.8)

## 2024-02-12 LAB — COMPLETE METABOLIC PANEL WITH GFR
AG Ratio: 1.5 (calc) (ref 1.0–2.5)
ALT: 10 U/L (ref 6–29)
AST: 11 U/L (ref 10–35)
Albumin: 4.2 g/dL (ref 3.6–5.1)
Alkaline phosphatase (APISO): 108 U/L (ref 37–153)
BUN: 11 mg/dL (ref 7–25)
CO2: 30 mmol/L (ref 20–32)
Calcium: 9.1 mg/dL (ref 8.6–10.4)
Chloride: 106 mmol/L (ref 98–110)
Creat: 0.78 mg/dL (ref 0.50–1.05)
Globulin: 2.8 g/dL (ref 1.9–3.7)
Glucose, Bld: 65 mg/dL (ref 65–139)
Potassium: 4.2 mmol/L (ref 3.5–5.3)
Sodium: 143 mmol/L (ref 135–146)
Total Bilirubin: 0.6 mg/dL (ref 0.2–1.2)
Total Protein: 7 g/dL (ref 6.1–8.1)
eGFR: 84 mL/min/{1.73_m2} (ref >=60)

## 2024-02-12 NOTE — Progress Notes (Signed)
 CBC and CMP WNL

## 2024-02-25 ENCOUNTER — Ambulatory Visit: Payer: Medicare Other | Attending: Nurse Practitioner

## 2024-02-25 DIAGNOSIS — I5020 Unspecified systolic (congestive) heart failure: Secondary | ICD-10-CM | POA: Diagnosis not present

## 2024-02-26 LAB — ECHOCARDIOGRAM COMPLETE
AR max vel: 1.69 cm2
AV Area VTI: 1.56 cm2
AV Area mean vel: 1.64 cm2
AV Mean grad: 4 mmHg
AV Peak grad: 6.4 mmHg
Ao pk vel: 1.26 m/s
Area-P 1/2: 5.09 cm2
Calc EF: 37.9 %
MV VTI: 2.01 cm2
S' Lateral: 5.3 cm
Single Plane A2C EF: 33.6 %
Single Plane A4C EF: 42.1 %

## 2024-03-10 ENCOUNTER — Ambulatory Visit: Payer: Medicare Other | Attending: Nurse Practitioner | Admitting: Nurse Practitioner

## 2024-03-10 ENCOUNTER — Ambulatory Visit

## 2024-03-10 ENCOUNTER — Encounter: Payer: Self-pay | Admitting: Nurse Practitioner

## 2024-03-10 ENCOUNTER — Telehealth: Payer: Self-pay | Admitting: Nurse Practitioner

## 2024-03-10 ENCOUNTER — Other Ambulatory Visit: Payer: Self-pay | Admitting: Nurse Practitioner

## 2024-03-10 VITALS — BP 120/72 | HR 63 | Ht 65.5 in | Wt 231.0 lb

## 2024-03-10 DIAGNOSIS — I10 Essential (primary) hypertension: Secondary | ICD-10-CM | POA: Diagnosis not present

## 2024-03-10 DIAGNOSIS — Z720 Tobacco use: Secondary | ICD-10-CM | POA: Diagnosis not present

## 2024-03-10 DIAGNOSIS — I428 Other cardiomyopathies: Secondary | ICD-10-CM

## 2024-03-10 DIAGNOSIS — R0789 Other chest pain: Secondary | ICD-10-CM

## 2024-03-10 DIAGNOSIS — E782 Mixed hyperlipidemia: Secondary | ICD-10-CM

## 2024-03-10 DIAGNOSIS — I5020 Unspecified systolic (congestive) heart failure: Secondary | ICD-10-CM | POA: Diagnosis not present

## 2024-03-10 DIAGNOSIS — R002 Palpitations: Secondary | ICD-10-CM

## 2024-03-10 DIAGNOSIS — E669 Obesity, unspecified: Secondary | ICD-10-CM

## 2024-03-10 NOTE — Telephone Encounter (Signed)
 Checking percert on the following patient for testing scheduled at [facility].     2 week zio xt

## 2024-03-10 NOTE — Progress Notes (Unsigned)
 Cardiology Office Note:   Date: 03/10/2024 ID:  JOHNESHA ACHEAMPONG, DOB 04-Apr-1956, MRN [id number] PCP:  Practice, [facility] Providers Cardiologist:  Nona Dell, MD    Referring MD: [facility]   CC: Here for HFrEF follow-up  History of Present Illness:    KATASHA RIGA is a delightful 68 y.o. female with a PMH of chronic systolic CHF, secondary CM, HTN, OSA, COPD, RA, de Quervain's disease, tobacco abuse, anemia, fibromyalgia, mixed HLD, and diverticulosis, who presents today for scheduled follow-up.  In 2013, Echo revealed 45 to 50%, grade 1 DD, with diffuse hypokinesis, mild left atrial enlargement, was felt to be nonischemic as previous cath revealed normal coronary arteries.    Seen by Dr. Diona Browner on May 24, 2022.  She reported dyspnea with typical activities, was not fluid up. TTE 10/2022 EF to 25 to 30%, global hypokinesis of left ventricle, severely dilated left atrial size, trivial MR, otherwise no other significant valvular abnormalities. At follow-up, was discovered EF dropped d/t pt stopped taking Entresto in the early part of summer due to not being able to afford [facility], had restarted it shortly prior to office visit. Was tolerating all her medications well. Was smoking about 6 cigarettes/day.  Last seen on December 10, 2023. Was doing well at the time.   TTE was updated once on max titrated GDMT and revealed EF 20-25%, grade 1 DD, no significant valvular abnormalities.   She presents today for follow-up for HFrEF.  Doing well.  Admits to atypical "twinges" of chest pain, not bothersome per her report.  Admits to noticing some areas along bilateral medial hands of vitiligo. Denies any shortness of breath, syncope, presyncope, dizziness, orthopnea, PND, swelling or significant weight changes, acute bleeding, or claudication.  Does admit to some palpitations at times.  ROS:   Please see the history of present illness.    All  other systems reviewed and are negative.  EKGs/Labs/Other Studies Reviewed:    The following studies were reviewed today:  EKG: EKG is not ordered today.    Echo 02/2024:  1. Left ventricular ejection fraction, by estimation, is 20 to 25%. The  left ventricle has severely decreased function. The left ventricle  demonstrates global hypokinesis. The left ventricular internal cavity size  was mildly dilated. Left ventricular  diastolic parameters are consistent with Grade I diastolic dysfunction  (impaired relaxation). Elevated left atrial pressure. The average left  ventricular global longitudinal strain is -14.2 %. The global longitudinal  strain is abnormal.   2. Right ventricular systolic function is normal. The right ventricular  size is normal. There is normal pulmonary artery systolic pressure.   3. The mitral valve is normal in structure. No evidence of mitral valve  regurgitation. No evidence of mitral stenosis.   4. The aortic valve is tricuspid. Aortic valve regurgitation is not  visualized. No aortic stenosis is present.   5. The inferior vena cava is normal in size with greater than 50%  respiratory variability, suggesting right atrial pressure of 3 mmHg.   Comparison(s): A prior study was performed on 08/21/2023. EF 20-25%. IV  unsuccessful for use of definity.  Limited echo 07/2023: 1. Limited Echo for LVEF assessment.   2. Left ventricular ejection fraction, by estimation, is 20 to 25%. The  left ventricle has severely decreased function. The left ventricle  demonstrates global hypokinesis. The left ventricular internal cavity size  was mildly dilated. There is mild left  ventricular hypertrophy. Left ventricular diastolic parameters  are  consistent with Grade I diastolic dysfunction (impaired relaxation).  Consider Limited Echo with contrast to rule out LV thrombus.   3. Right ventricular systolic function is normal. The right ventricular  size is normal.    Comparison(s): No significant change from prior study.  Echocardiogram on November 23, 2022:  1. Left ventricular ejection fraction, by estimation, is 25 to 30%. The  left ventricle has severely decreased function. The left ventricle  demonstrates global hypokinesis. Left ventricular diastolic parameters are  indeterminate. Elevated left atrial  pressure. The average left ventricular global longitudinal strain is -14.0  %. The global longitudinal strain is abnormal.   2. Right ventricular systolic function is normal. The right ventricular  size is normal.   3. Left atrial size was severely dilated.   4. The mitral valve is normal in structure. Trivial mitral valve  regurgitation. No evidence of mitral stenosis.   5. The tricuspid valve is abnormal.   6. The aortic valve has an indeterminant number of cusps. Aortic valve  regurgitation is not visualized. No aortic stenosis is present.   7. The inferior vena cava is normal in size with greater than 50%  respiratory variability, suggesting right atrial pressure of 3 mmHg.   Comparison(s): Echocardiogram done 09/08/21 showed an EF of 45-50%.  Physical Exam:    VS:  BP 120/72 (BP Location: Left Arm, Cuff Size: Large)   Pulse 63   Ht 5' 5.5" (1.664 m)   Wt 231 lb (104.8 kg)   SpO2 97%   BMI 37.86 kg/m     Wt Readings from Last 3 Encounters:  03/10/24 231 lb (104.8 kg)  12/10/23 227 lb (103 kg)  10/31/23 225 lb (102.1 kg)    GEN: Obese, 68 y.o. female in no acute distress HEENT: Normal NECK: No JVD; No carotid bruits CARDIAC: S1/S2, RRR, no murmurs, rubs, gallops; 2+ peripheral pulses throughout, strong and equal bilaterally RESPIRATORY:  Clear to auscultation without rales, wheezing or rhonchi  MUSCULOSKELETAL:  No edema to BLE; No deformity SKIN: Warm and dry NEUROLOGIC:  Alert and oriented x 3 PSYCHIATRIC:  Normal affect   ASSESSMENT & PLAN:     HFrEF, NICM Stage C, NYHA class I symptoms.  Limited TTE 07/2023 revealed  EF 20 to 25%, repeat Echo 02/2024 revealed EF 20 to 25%.  Etiology unclear.  Was felt to be nonischemic in nature.  Euvolemic and well compensated on exam. Arranging 2-week ZIO XT monitor as mentioned below for palpitations.  Consulted DOD, Dr. Jenene Slicker, who agreed with my plan to arrange cardiac MRI.  Dr. Ival Bible recent result note from recent echo stated, "She may actually end up needing a cardiac MRI and referral to EP if LVEF remains reduced.  She had HFmrEF when I last saw her in 2023." Continue carvedilol, Farxiga, spironolactone, Entresto, and Lasix. Low sodium diet, fluid restriction <2L, and daily weights encouraged. Educated to contact our office for weight gain of 2 lbs overnight or 5 lbs in one week. Heart healthy diet and regular cardiovascular exercise encouraged.  May need to consider further ischemic evaluation if workup comes back reassuring.  2. Atypical CP Does not sound cardiac related. No indication for ischemic evaluation at this time. See above. Will continue to monitor. No medication changes at this time. Care and ED precautions discussed.   3.  Palpitations Does admit to some palpitations at times.  Will arrange 2-week ZIO XT monitor for further evaluation.  Continue Coreg.  Heart healthy diet recommended.  4.  HTN BP today stable. BP well controlled at home. Continue current medication regimen. Discussed to monitor BP at home at least 2 hours after medications and sitting for 5-10 minutes. Heart healthy diet and regular cardiovascular exercise encouraged.   5. Obesity Weight loss via diet and exercise encouraged. Discussed the impact being overweight would have on cardiovascular risk.  6. Tobacco abuse Smoking cessation encouraged and discussed.    5. Mixed HLD She has not had lab work obtained that was previously ordered last December.  Instructed her to get previously ordered labs drawn.  She verbalized understanding.  Continue atorvastatin. Heart healthy diet and  regular cardiovascular exercise encouraged.   5. Disposition: Follow-up with Dr. Diona Browner or APP in 6-8 weeks or sooner if anything changes.   Medication Adjustments/Labs and Tests Ordered: Current medicines are reviewed at length with the patient today.  Concerns regarding medicines are outlined above.  Orders Placed This Encounter  Procedures   MR CARDIAC MORPHOLOGY W WO CONTRAST   Hemoglobin and hematocrit, blood   CBC   No orders of the defined types were placed in this encounter.   Patient Instructions  Medication Instructions:  Your physician recommends that you continue on your current medications as directed. Please refer to the Current Medication list given to you today.  Labwork: Please have previous [facility] done at [facility] in 2-3 weeks   Testing/Procedures: ZIO- Long Term Monitor Instructions   Your physician has requested you wear your ZIO patch monitor 14 days.   This is a single patch monitor.  [employer] supplies one patch monitor per enrollment.  Additional stickers are not available.   Please do not apply patch if you will be having a Nuclear Stress Test, Echocardiogram, Cardiac CT, MRI, or Chest Xray during the time frame you would be wearing the monitor. The patch cannot be worn during these tests.  You cannot remove and re-apply the ZIO XT patch monitor.   Your ZIO patch monitor will be sent USPS Priority mail from [facility] directly to your home address. The monitor may also be mailed to a PO BOX if home delivery is not available.   It may take 3-5 days to receive your monitor after you have been enrolled.   Once you have received you monitor, please review enclosed instructions.  Your monitor has already been registered assigning a specific monitor serial # to you.   Applying the monitor   Shave hair from upper left chest.   Hold abrader disc by orange tab.  Rub abrader in 40 strokes over left upper chest as indicated in your monitor instructions.    Clean area with 4 enclosed alcohol pads .  Use all pads to assure are is cleaned thoroughly.  Let dry.   Apply patch as indicated in monitor instructions.  Patch will be place under collarbone on left side of chest with arrow pointing upward.   Rub patch adhesive wings for 2 minutes.Remove white label marked "1".  Remove white label marked "2".  Rub patch adhesive wings for 2 additional minutes.   While looking in a mirror, press and release button in center of patch.  A small green light will flash 3-4 times .  This will be your only indicator the monitor has been turned on.     Do not shower for the first 24 hours.  You may shower after the first 24 hours.   Press button if you feel a symptom. You will hear a small click.  Record Date,  Time and Symptom in the Patient Log Book.   When you are ready to remove patch, follow instructions on last 2 pages of Patient Log Book.  Stick patch monitor onto last page of Patient Log Book.   Place Patient Log Book in [place] box.  Use locking tab on box and tape box closed securely.  The [employer] has [employer] on it.  Please place in mailbox as soon as possible.  Your physician should have your test results approximately 7 days after the monitor has been mailed back to [facility].   Call [facility] Customer Care at [phone] if you have questions regarding your ZIO XT patch monitor.  Call them immediately if you see an orange light blinking on your monitor.   If your monitor falls off in less than 4 days contact our Monitor department at [phone].  If your monitor becomes loose or falls off after 4 days call [employer] at [phone] for suggestions on securing your monitor.  Follow-Up: Your physician recommends that you schedule a follow-up appointment in: 6-8 weeks   Any Other Special Instructions Will Be Listed Below (If Applicable).  If you need a refill on your cardiac medications before your next appointment,  please call your pharmacy.   Signed, Sharlene Dory, NP

## 2024-03-10 NOTE — Patient Instructions (Addendum)
 Medication Instructions:  Your physician recommends that you continue on your current medications as directed. Please refer to the Current Medication list given to you today.  Labwork: Please [facility] in [facility] weeks   Testing/Procedures: ZIO- Long Term Monitor Instructions   Your physician has requested you wear your ZIO patch monitor 14 days.   This is a single patch monitor.  [employer] supplies one patch monitor per enrollment.  Additional stickers are not available.   Please do not apply patch if you will be having a Nuclear Stress Test, Echocardiogram, Cardiac CT, MRI, or Chest Xray during the time frame you would be wearing the monitor. The patch cannot be worn during these tests.  You cannot remove and re-apply the ZIO XT patch monitor.   Your ZIO patch monitor will be sent USPS Priority mail from [facility] directly to your home address. The monitor may also be mailed to a PO BOX if home delivery is not available.   It may take 3-5 days to receive your monitor after you have been enrolled.   Once you have received you monitor, please review enclosed instructions.  Your monitor has already been registered assigning a specific monitor serial # to you.   Applying the monitor   Shave hair from upper left chest.   Hold abrader disc by orange tab.  Rub abrader in 40 strokes over left upper chest as indicated in your monitor instructions.   Clean area with 4 enclosed alcohol pads .  Use all pads to assure are is cleaned thoroughly.  Let dry.   Apply patch as indicated in monitor instructions.  Patch will be place under collarbone on left side of chest with arrow pointing upward.   Rub patch adhesive wings for 2 minutes.Remove white label marked "1".  Remove white label marked "2".  Rub patch adhesive wings for 2 additional minutes.   While looking in a mirror, press and release button in center of patch.  A small green light will flash 3-4 times .  This  will be your only indicator the monitor has been turned on.     Do not shower for the first 24 hours.  You may shower after the first 24 hours.   Press button if you feel a symptom. You will hear a small click.  Record Date, Time and Symptom in the Patient Log Book.   When you are ready to remove patch, follow instructions on last 2 pages of Patient Log Book.  Stick patch monitor onto last page of Patient Log Book.   Place Patient Log Book in [place] box.  Use locking tab on box and tape box closed securely.  The [employer] and [employer] has [employer] on it.  Please place in mailbox as soon as possible.  Your physician should have your test results approximately 7 days after the monitor has been mailed back to [facility].   Call [facility] Customer Care at [phone] if you have questions regarding your ZIO XT patch monitor.  Call them immediately if you see an orange light blinking on your monitor.   If your monitor falls off in less than 4 days contact our Monitor department at [phone].  If your monitor becomes loose or falls off after 4 days call [employer] at [phone] for suggestions on securing your monitor.  Follow-Up: Your physician recommends that you schedule a follow-up appointment in: 6-8 weeks   Any Other Special Instructions Will Be Listed Below (If  Applicable).  If you need a refill on your cardiac medications before your next appointment, please call your pharmacy.

## 2024-03-12 DIAGNOSIS — I428 Other cardiomyopathies: Secondary | ICD-10-CM | POA: Diagnosis not present

## 2024-03-12 DIAGNOSIS — I5020 Unspecified systolic (congestive) heart failure: Secondary | ICD-10-CM | POA: Diagnosis not present

## 2024-03-12 LAB — CBC
HCT: 41.7 % (ref 35.0–45.0)
Hemoglobin: 13.7 g/dL (ref 11.7–15.5)
MCH: 29.8 pg (ref 27.0–33.0)
MCHC: 32.9 g/dL (ref 32.0–36.0)
MCV: 90.7 fL (ref 80.0–100.0)
MPV: 9.5 fL (ref 7.5–12.5)
Platelets: 264 10*3/uL (ref 140–400)
RBC: 4.6 10*6/uL (ref 3.80–5.10)
RDW: 12.8 % (ref 11.0–15.0)
WBC: 6.3 10*3/uL (ref 3.8–10.8)

## 2024-03-12 LAB — HEMOGLOBIN AND HEMATOCRIT, BLOOD
HCT: 41.7 % (ref 35.0–45.0)
Hemoglobin: 13.8 g/dL (ref 11.7–15.5)

## 2024-03-17 ENCOUNTER — Ambulatory Visit: Payer: Medicare Other | Attending: Physician Assistant | Admitting: Physician Assistant

## 2024-03-17 DIAGNOSIS — M1712 Unilateral primary osteoarthritis, left knee: Secondary | ICD-10-CM

## 2024-03-17 MED ORDER — HYLAN G-F 20 16 MG/2ML IX SOSY
16.0000 mg | PREFILLED_SYRINGE | INTRA_ARTICULAR | Status: AC | PRN
Start: 2024-03-17 — End: 2024-03-17
  Administered 2024-03-17: 16 mg via INTRA_ARTICULAR

## 2024-03-17 MED ORDER — LIDOCAINE HCL 1 % IJ SOLN
1.5000 mL | INTRAMUSCULAR | Status: AC | PRN
Start: 2024-03-17 — End: 2024-03-17
  Administered 2024-03-17: 1.5 mL

## 2024-03-17 NOTE — Progress Notes (Signed)
   Procedure Note  Patient: Caroline Mason             Date of Birth: 10-Oct-1956           MRN: [id number]             Visit Date: 03/17/2024  Procedures: Visit Diagnoses:  1. Primary osteoarthritis of left knee     Synvisc #1 left knee, B/B Large Joint Inj: L knee on 03/17/2024 8:29 AM Indications: pain Details: 25 G 1.5 in needle, medial approach  Arthrogram: No  Medications: 1.5 mL lidocaine 1 %; 16 mg hylan 16 MG/2ML Aspirate: 0 mL Outcome: tolerated well, no immediate complications Procedure, treatment alternatives, risks and benefits explained, specific risks discussed. Consent was given by the patient. Immediately prior to procedure a time out was called to verify the correct patient, procedure, equipment, support staff and site/side marked as required.    Patient tolerated the procedure well.  Aftercare was discussed.  Sherron Ales, PA-C

## 2024-03-24 ENCOUNTER — Ambulatory Visit: Payer: Medicare Other | Attending: Physician Assistant | Admitting: Physician Assistant

## 2024-03-24 DIAGNOSIS — M1712 Unilateral primary osteoarthritis, left knee: Secondary | ICD-10-CM | POA: Diagnosis not present

## 2024-03-24 MED ORDER — HYLAN G-F 20 16 MG/2ML IX SOSY
16.0000 mg | PREFILLED_SYRINGE | INTRA_ARTICULAR | Status: AC | PRN
Start: 2024-03-24 — End: 2024-03-24
  Administered 2024-03-24: 16 mg via INTRA_ARTICULAR

## 2024-03-24 MED ORDER — LIDOCAINE HCL 1 % IJ SOLN
1.5000 mL | INTRAMUSCULAR | Status: AC | PRN
Start: 2024-03-24 — End: 2024-03-24
  Administered 2024-03-24: 1.5 mL

## 2024-03-24 NOTE — Progress Notes (Signed)
   Procedure Note  Patient: Caroline Mason             Date of Birth: 07-May-1956           MRN: [id number]             Visit Date: 03/24/2024  Procedures: Visit Diagnoses:  1. Primary osteoarthritis of left knee    Synvisc #2 left knee, B/B Large Joint Inj: L knee on 03/24/2024 8:04 AM Indications: pain Details: 25 G 1.5 in needle, medial approach  Arthrogram: No  Medications: 1.5 mL lidocaine 1 %; 16 mg hylan 16 MG/2ML Aspirate: 0 mL Outcome: tolerated well, no immediate complications Procedure, treatment alternatives, risks and benefits explained, specific risks discussed. Consent was given by the patient. Immediately prior to procedure a time out was called to verify the correct patient, procedure, equipment, support staff and site/side marked as required.     Patient tolerated the procedure  well.  Aftercare was discussed.  Sherron Ales, PA-C

## 2024-03-28 NOTE — Progress Notes (Signed)
 Office Visit Note  Patient: Caroline Mason             Date of Birth: [birth year]           MRN: [id number]             PCP: [facility] Referring: [facility] Visit Date: 03/31/2024 Occupation: @GUAROCC @  Subjective:  3rd synvisc left knee injection   History of Present Illness: Caroline Mason is a 68 y.o. female with history of rheumatoid arthritis and osteoarthritis.  Patient remains on Methotrexate 8 tablets by mouth once weekly, folic acid 2 mg daily, and Plaquenil 200 mg 1 tablet by mouth twice daily.  She is tolerating combination therapy without any side effects and has not had any recent gaps in therapy. Patient presents today for the 3rd synvisc injection of the series for the left knee.  Patient states that she has noticed an improvement in her left knee pain since starting the series.  She has some stiffness in the left knee with rainy weather but denies any joint swelling.  She denies any recent or recurrent infections.   Activities of Daily Living:  Patient reports morning stiffness for 1 hour Patient Denies nocturnal pain.  Difficulty dressing/grooming: Denies Difficulty climbing stairs: Denies Difficulty getting out of chair: Denies Difficulty using hands for taps, buttons, cutlery, and/or writing: Denies  Review of Systems  Constitutional:  Negative for fatigue.  HENT:  Negative for mouth sores, mouth dryness and nose dryness.   Eyes:  Positive for dryness. Negative for pain and visual disturbance.  Respiratory:  Negative for cough, hemoptysis, shortness of breath and difficulty breathing.   Cardiovascular:  Positive for palpitations. Negative for chest pain, hypertension and swelling in legs/feet.  Gastrointestinal:  Negative for blood in stool, constipation and diarrhea.  Endocrine: Negative for increased urination.  Genitourinary:  Negative for painful urination.  Musculoskeletal:  Positive for joint pain, joint pain and  morning stiffness. Negative for joint swelling, myalgias, muscle weakness, muscle tenderness and myalgias.  Skin:  Negative for color change, pallor, rash, hair loss, nodules/bumps, skin tightness, ulcers and sensitivity to sunlight.  Neurological:  Negative for dizziness, numbness, headaches and weakness.  Hematological:  Negative for swollen glands.  Psychiatric/Behavioral:  Negative for depressed mood and sleep disturbance. The patient is not nervous/anxious.     PMFS History:  Patient Active Problem List   Diagnosis Date Noted   Allergy to alpha-gal 09/25/2023   Chronic diarrhea 01/29/2023   Abdominal pain 01/29/2023   IBS (irritable bowel syndrome) 01/29/2023   Tommi Rumps Quervain's disease (radial styloid tenosynovitis) 09/14/2022   Abdominal pain, left lower quadrant 06/08/2022   Loose stools 06/08/2022   Unspecified rotator cuff tear or rupture of right shoulder, not specified as traumatic 02/15/2022   Ganglion cyst of wrist, right 08/14/2018   Trigger thumb, right thumb 03/27/2018   High risk medication use 12/01/2016   Rheumatoid arthritis with rheumatoid factor of multiple sites without organ or systems involvement (HCC) 12/01/2016   Fibromyalgia 12/01/2016   Primary osteoarthritis of both hands 12/01/2016   Primary osteoarthritis of both knees 12/01/2016   Primary osteoarthritis of both feet 12/01/2016   Bradycardia 07/18/2011   Chronic systolic heart failure (HCC) 06/05/2011   ANEMIA 03/01/2011   TOBACCO ABUSE 01/03/2011   OBSTRUCTIVE SLEEP APNEA 01/03/2011   ESSENTIAL HYPERTENSION, BENIGN 01/03/2011   Secondary cardiomyopathy (HCC) 01/03/2011   Other specified chronic obstructive pulmonary disease (HCC) 01/03/2011    Past Medical History:  Diagnosis  Date   Atypical pneumonia    12/11   Cardiomyopathy    Nonischemic, normal coronaries, LVEF 40-45% >> LVEF 25-30% 10/2022   COPD (chronic obstructive pulmonary disease) (HCC)    Depression    Diverticulitis    per patient    Diverticulosis    Essential hypertension    Fibromyalgia    Morbid obesity (HCC)    Obstructive sleep apnea    Rheumatoid arthritis(714.0)     Family History  Problem Relation Age of Onset   Stroke Mother    Hypertension Mother    Heart failure Sister 27   Breast cancer Sister    Heart failure Sister 51   Cancer Sister    Ovarian cancer Sister    Diabetes Maternal Grandmother    Diabetes Paternal Grandfather    Past Surgical History:  Procedure Laterality Date   COLONOSCOPY WITH PROPOFOL N/A 07/25/2022   Procedure: COLONOSCOPY WITH PROPOFOL;  Surgeon: Dolores Frame, MD;  [facility];  Service: Gastroenterology;  Laterality: N/A;  815   CYST REMOVAL TRUNK     POLYPECTOMY  07/25/2022   Procedure: POLYPECTOMY;  Surgeon: Dolores Frame, MD;  [facility]: [facility];  Service: Gastroenterology;;  cecal and ascending;descending   ROTATOR CUFF REPAIR Right 02/2022   TUBAL LIGATION  1989   WRIST SURGERY  2016   Social History   Social History Narrative   Lives in [place].   Immunization History  Administered Date(s) Administered   Influenza-Unspecified 09/24/2014, 10/25/2018   Moderna Sars-Covid-2 Vaccination 02/09/2020, 03/11/2020, 11/30/2020     Objective: Vital Signs: BP 103/66 (BP Location: Left Arm, Patient Position: Sitting, Cuff Size: Large)   Pulse 63   Resp 16   Ht 5\' 5"  (1.651 m)   Wt 230 lb (104.3 kg)   BMI 38.27 kg/m    Physical Exam Vitals and nursing note reviewed.  Constitutional:      Appearance: She is well-developed.  HENT:     Head: Normocephalic and atraumatic.  Eyes:     Conjunctiva/sclera: Conjunctivae normal.  Cardiovascular:     Rate and Rhythm: Normal rate and regular rhythm.     Heart sounds: Normal heart sounds.  Pulmonary:     Effort: Pulmonary effort is normal.     Breath sounds: Normal breath sounds.  Abdominal:     General: Bowel sounds are normal.     Palpations: Abdomen is soft.   Musculoskeletal:     Cervical back: Normal range of motion.  Lymphadenopathy:     Cervical: No cervical adenopathy.  Skin:    General: Skin is warm and dry.     Capillary Refill: Capillary refill takes less than 2 seconds.  Neurological:     Mental Status: She is alert and oriented to person, place, and time.  Psychiatric:        Behavior: Behavior normal.      Musculoskeletal Exam: C-spine, thoracic spine, lumbar spine and good range of motion.  Shoulder joints have good range of motion with some discomfort and tenderness of the right shoulder.  Elbow joints have good range of motion with no tenderness along the joint line.  Wrist joints have good range of motion with no tenderness or inflammation.  No tenderness or synovitis over MCP joints.  Complete fist formation bilaterally.  Hip joints have good range of motion with no groin pain.  Knee joints have good ROM with no warmth or effusion.  Ankle joints have good ROM with no tenderness or  joint swelling.    CDAI Exam: CDAI Score: -- Patient Global: --; Provider Global: -- Swollen: --; Tender: -- Joint Exam 03/31/2024   No joint exam has been documented for this visit   There is currently no information documented on the homunculus. Go to the Rheumatology activity and complete the homunculus joint exam.  Investigation: No additional findings.  Imaging: No results found.  Recent Labs: Lab Results  Component Value Date   WBC 6.3 03/12/2024   HGB 13.7 03/12/2024   PLT 264 03/12/2024   NA 143 02/11/2024   K 4.2 02/11/2024   CL 106 02/11/2024   CO2 30 02/11/2024   GLUCOSE 65 02/11/2024   BUN 11 02/11/2024   CREATININE 0.78 02/11/2024   BILITOT 0.6 02/11/2024   ALKPHOS 102 12/05/2016   AST 11 02/11/2024   ALT 10 02/11/2024   PROT 7.0 02/11/2024   ALBUMIN 4.0 12/05/2016   CALCIUM 9.1 02/11/2024   GFRAA 108 09/29/2020   QFTBGOLDPLUS NEGATIVE 02/21/2023    Speciality Comments: PLQ Eye Exam: 05/23/2023 W[facility] follow up 1 year  Procedures:  Large Joint Inj: L knee on 03/31/2024 10:44 AM Indications: pain Details: 25 G 1.5 in needle, medial approach  Arthrogram: No  Medications: 1.5 mL lidocaine 1 %; 16 mg hylan 16 MG/2ML Aspirate: 0 mL Outcome: tolerated well, no immediate complications Procedure, treatment alternatives, risks and benefits explained, specific risks discussed. Consent was given by the patient. Immediately prior to procedure a time out was called to verify the correct patient, procedure, equipment, support staff and site/side marked as required. Patient was prepped and draped in the usual sterile fashion.     Allergies: Aspirin   Assessment / Plan:     Visit Diagnoses: Rheumatoid arthritis with rheumatoid factor of multiple sites without organ or systems involvement (HCC) - +RF, synovitis: No synovitis was noted on examination today.  She has not had any signs or symptoms of a rheumatoid arthritis flare.  She has not had any nocturnal pain or difficulty performing ADLs.  She has clinically been doing well taking methotrexate 8 tablets by mouth once weekly, folic acid 2 mg daily, Plaquenil 200 mg 1 tablet by mouth twice daily.  She has been tolerating combination therapy without any side effects and has not had any recent gaps in therapy.  Her left knee joint pain and stiffness has started to improve since initiating the Synvisc series.  She presents today for the third injection of the series.  She tolerated procedure well.  Procedure note was completed above.  Aftercare was discussed.   No recent or recurrent infections. Patient will remain on combination therapy as prescribed.  She was advised to notify us if she develops signs or symptoms of a flare.  She will follow-up in the office in 5 months or sooner if needed.- Plan: Lipid panel  High risk medication use - Methotrexate 8 tablets by mouth once weekly, folic acid 2 mg daily, and Plaquenil 200 mg 1 tablet by mouth  twice daily.  CBC and CMP updated on 02/11/24.  Her next lab work will be due in May and every 3 months to monitor for drug toxicity.  Standing orders for CBC and CMP were placed today. Future order for lipid panel placed today. PLQ Eye Exam: 05/23/2023 WNL [facility] follow up 1 year.  Patient states that she is scheduled for an updated eye examination in August 2025-her appointment has been pushed back due to Dr. Dione Booze  retiring. No recent or recurrent infections.  Discussed the importance of holding methotrexate if she develops signs or symptoms of an infection and to resume once the infection has completely cleared.  - Plan: CBC with Differential/Platelet, Comprehensive metabolic panel with GFR  De Quervain's tenosynovitis, bilateral: Resolved.  Ultrasound-guided injections performed on 11/20/2023 alleviated her symptoms.  Primary osteoarthritis of both hands: PIP and DIP thickening noted.  No active inflammation noted.  Complete fist formation bilaterally.   Chronic right shoulder pain - History of complete rotator cuff tear of right shoulder-surgically repaired by Dr. Cleophas Dunker in March 2023.  Patient is been experiencing some increased discomfort and tenderness of the right shoulder.  She had good range of motion on examination today.  Primary osteoarthritis of both knees: She has good range of motion of both knee joints on examination today.  Patient presents today for the third Synvisc injection of the series for the left knee.  She tolerated procedure well.  Procedure note was completed above.  Aftercare was discussed.  Primary osteoarthritis of left knee: She has started to notice an improvement in her left knee pain since starting the synvisc series.  Patient presents today for the third Synvisc injection of the series.  She tolerated the procedure well.  Procedure note was completed above.  Aftercare was discussed.  Primary osteoarthritis of both feet: She is not  experiencing any increased discomfort in her feet at this time.  She has good range of motion of both ankle joints with no tenderness or joint swelling.  Fibromyalgia: Overall her symptoms have been manageable.  She has been taking gabapentin as prescribed.  Patient remains active performing yard work on a daily basis.  Other medical conditions are listed as follows:  History of COPD  History of anemia  History of vitamin D deficiency  History of sleep apnea  Lipid screening -Future order for lipid panel placed today.  Plan: Lipid panel  Orders: Orders Placed This Encounter  Procedures   Large Joint Inj: L knee   CBC with Differential/Platelet   Comprehensive metabolic panel with GFR   Lipid panel   No orders of the defined types were placed in this encounter.    Follow-Up Instructions: Return in about 5 months (around 08/31/2024) for Rheumatoid arthritis, Osteoarthritis.   Gearldine Bienenstock, PA-C  Note - This record has been created using Dragon software.  Chart creation errors have been sought, but may not always  have been located. Such creation errors do not reflect on  the standard of medical care.

## 2024-03-31 ENCOUNTER — Encounter: Payer: Self-pay | Admitting: Physician Assistant

## 2024-03-31 ENCOUNTER — Ambulatory Visit: Payer: Medicare Other | Attending: Physician Assistant | Admitting: Physician Assistant

## 2024-03-31 VITALS — BP 103/66 | HR 63 | Resp 16 | Ht 65.0 in | Wt 230.0 lb

## 2024-03-31 DIAGNOSIS — Z79899 Other long term (current) drug therapy: Secondary | ICD-10-CM | POA: Diagnosis not present

## 2024-03-31 DIAGNOSIS — Z13220 Encounter for screening for lipoid disorders: Secondary | ICD-10-CM

## 2024-03-31 DIAGNOSIS — M654 Radial styloid tenosynovitis [de Quervain]: Secondary | ICD-10-CM | POA: Diagnosis not present

## 2024-03-31 DIAGNOSIS — M19041 Primary osteoarthritis, right hand: Secondary | ICD-10-CM

## 2024-03-31 DIAGNOSIS — Z8639 Personal history of other endocrine, nutritional and metabolic disease: Secondary | ICD-10-CM | POA: Diagnosis not present

## 2024-03-31 DIAGNOSIS — M170 Bilateral primary osteoarthritis of knee: Secondary | ICD-10-CM

## 2024-03-31 DIAGNOSIS — M19071 Primary osteoarthritis, right ankle and foot: Secondary | ICD-10-CM | POA: Diagnosis not present

## 2024-03-31 DIAGNOSIS — Z8709 Personal history of other diseases of the respiratory system: Secondary | ICD-10-CM | POA: Diagnosis not present

## 2024-03-31 DIAGNOSIS — M797 Fibromyalgia: Secondary | ICD-10-CM

## 2024-03-31 DIAGNOSIS — M25511 Pain in right shoulder: Secondary | ICD-10-CM

## 2024-03-31 DIAGNOSIS — G8929 Other chronic pain: Secondary | ICD-10-CM

## 2024-03-31 DIAGNOSIS — M1712 Unilateral primary osteoarthritis, left knee: Secondary | ICD-10-CM | POA: Diagnosis not present

## 2024-03-31 DIAGNOSIS — Z862 Personal history of diseases of the blood and blood-forming organs and certain disorders involving the immune mechanism: Secondary | ICD-10-CM | POA: Diagnosis not present

## 2024-03-31 DIAGNOSIS — M19072 Primary osteoarthritis, left ankle and foot: Secondary | ICD-10-CM

## 2024-03-31 DIAGNOSIS — M19042 Primary osteoarthritis, left hand: Secondary | ICD-10-CM

## 2024-03-31 DIAGNOSIS — M0579 Rheumatoid arthritis with rheumatoid factor of multiple sites without organ or systems involvement: Secondary | ICD-10-CM

## 2024-03-31 DIAGNOSIS — Z8669 Personal history of other diseases of the nervous system and sense organs: Secondary | ICD-10-CM

## 2024-03-31 MED ORDER — LIDOCAINE HCL 1 % IJ SOLN
1.5000 mL | INTRAMUSCULAR | Status: AC | PRN
  Administered 2024-03-31: 1.5 mL

## 2024-03-31 MED ORDER — HYLAN G-F 20 16 MG/2ML IX SOSY
16.0000 mg | PREFILLED_SYRINGE | INTRA_ARTICULAR | Status: AC | PRN
  Administered 2024-03-31: 16 mg via INTRA_ARTICULAR

## 2024-03-31 NOTE — Patient Instructions (Addendum)
 Standing Labs We placed an order today for your standing lab work.   Please have your standing labs drawn at the end of May and every 3 months   Please have your labs drawn 2 weeks prior to your appointment so that the provider can discuss your lab results at your appointment, if possible.  Please note that you may see your imaging and lab results in [place] before we have reviewed them. We will contact you once all results are reviewed. Please allow our office up to 72 hours to thoroughly review all of the results before contacting the office for clarification of your results.  WALK-IN LAB HOURS  Monday through Thursday from 8:00 am -12:30 pm and 1:00 pm-5:00 pm and Friday from 8:00 am-12:00 pm.  Patients with office visits requiring labs will be seen before walk-in labs.  You may encounter longer than normal wait times. Please allow additional time. Wait times may be shorter on  Monday and Thursday afternoons.  We do not book appointments for walk-in labs. We appreciate your patience and understanding with our staff.   Labs are drawn by [facility]. Please bring your co-pay at the time of your lab draw.  You may receive a bill from [place] for your lab work.  Please note if you are on Hydroxychloroquine and and an order has been placed for a Hydroxychloroquine level,  you will need to have it drawn 4 hours or more after your last dose.  If you wish to have your labs drawn at another location, please call the office 24 hours in advance so we can fax the orders.  The office is located at [address], [place], [place], [place] [address]   If you have any questions regarding directions or hours of operation,  please call [phone].   As a reminder, please drink plenty of water prior to coming for your lab work. Thanks!

## 2024-04-01 ENCOUNTER — Other Ambulatory Visit: Payer: Self-pay | Admitting: Physician Assistant

## 2024-04-01 NOTE — Telephone Encounter (Signed)
 Last Fill: 02/06/2024 (30 day supply)  Labs: 02/11/2024 CBC and CMP WNL   Next Visit: 09/01/2024  Last Visit: 03/31/2024  DX: Rheumatoid arthritis with rheumatoid factor of multiple sites without organ or systems involvement   Current Dose per office note 03/31/2024: Methotrexate 8 tablets by mouth once weekly   Okay to refill Methotrexate?

## 2024-04-11 ENCOUNTER — Telehealth (HOSPITAL_COMMUNITY): Payer: Self-pay | Admitting: Emergency Medicine

## 2024-04-11 DIAGNOSIS — R002 Palpitations: Secondary | ICD-10-CM | POA: Diagnosis not present

## 2024-04-11 NOTE — Telephone Encounter (Signed)
Attempted to call patient regarding upcoming cardiac MR appointment. Left message on voicemail with name and callback number Roberta Angell RN Navigator Cardiac Imaging [facility] [phone] Office [phone] Cell  

## 2024-04-14 ENCOUNTER — Ambulatory Visit (HOSPITAL_COMMUNITY)
Admission: RE | Admit: 2024-04-14 | Discharge: 2024-04-14 | Disposition: A | Discharge status: Home / Self Care | Source: Ambulatory Visit | Attending: Nurse Practitioner | Admitting: Nurse Practitioner

## 2024-04-14 ENCOUNTER — Other Ambulatory Visit: Payer: Self-pay | Admitting: Nurse Practitioner

## 2024-04-14 DIAGNOSIS — I428 Other cardiomyopathies: Secondary | ICD-10-CM | POA: Diagnosis not present

## 2024-04-14 DIAGNOSIS — R002 Palpitations: Secondary | ICD-10-CM | POA: Diagnosis not present

## 2024-04-14 DIAGNOSIS — I5020 Unspecified systolic (congestive) heart failure: Secondary | ICD-10-CM

## 2024-04-14 MED ORDER — GADOBUTROL 1 MMOL/ML IV SOLN
10.0000 mL | Freq: Once | INTRAVENOUS | Status: AC | PRN
  Administered 2024-04-14: 10 mL via INTRAVENOUS

## 2024-04-19 ENCOUNTER — Other Ambulatory Visit: Payer: Self-pay | Admitting: Cardiology

## 2024-04-21 ENCOUNTER — Encounter: Payer: Self-pay | Admitting: Nurse Practitioner

## 2024-04-21 ENCOUNTER — Telehealth: Payer: Self-pay | Admitting: Nurse Practitioner

## 2024-04-21 ENCOUNTER — Ambulatory Visit: Attending: Nurse Practitioner | Admitting: Nurse Practitioner

## 2024-04-21 VITALS — BP 138/88 | HR 75 | Ht 65.5 in | Wt 232.4 lb

## 2024-04-21 DIAGNOSIS — I428 Other cardiomyopathies: Secondary | ICD-10-CM | POA: Diagnosis not present

## 2024-04-21 DIAGNOSIS — E782 Mixed hyperlipidemia: Secondary | ICD-10-CM | POA: Diagnosis not present

## 2024-04-21 DIAGNOSIS — Z720 Tobacco use: Secondary | ICD-10-CM

## 2024-04-21 DIAGNOSIS — R002 Palpitations: Secondary | ICD-10-CM

## 2024-04-21 DIAGNOSIS — I5020 Unspecified systolic (congestive) heart failure: Secondary | ICD-10-CM | POA: Diagnosis not present

## 2024-04-21 DIAGNOSIS — Z01812 Encounter for preprocedural laboratory examination: Secondary | ICD-10-CM

## 2024-04-21 DIAGNOSIS — I10 Essential (primary) hypertension: Secondary | ICD-10-CM | POA: Diagnosis not present

## 2024-04-21 DIAGNOSIS — E669 Obesity, unspecified: Secondary | ICD-10-CM

## 2024-04-21 MED ORDER — CARVEDILOL 6.25 MG PO TABS: 6.2500 mg | ORAL_TABLET | Freq: Two times a day (BID) | ORAL | 1 refills | Status: DC

## 2024-04-21 NOTE — Patient Instructions (Addendum)
 Medication Instructions:  Your physician has recommended you make the following change in your medication:  Please Increase Coreg  to 6.25 Mg Twice daily   Labwork: Today or tomorrow at [facility]   Testing/Procedures:  [facility] A DEPT OF [place]. [facility] [address] [place] [address] Dept: [phone] Loc: [phone]  Caroline Mason  04/21/2024  You are scheduled for a Cardiac Catheterization on Wednesday, May 7 with Dr. Randene Bustard.  1. Please arrive at the [facility] (Main Entrance A) at [facility]: [address] [place], [place] [address] at 6:30 AM (This time is 2 hour(s) before your procedure to ensure your preparation).   Free valet parking service is available. You will check in at ADMITTING. The support person will be asked to wait in the waiting room.  It is OK to have someone drop you off and come back when you are ready to be discharged.    Special note: Every effort is made to have your procedure done on time. Please understand that emergencies sometimes delay scheduled procedures.  2. Diet: Do not eat solid foods after midnight.  The patient may have clear liquids until 5am upon the day of the procedure.  3. Labs: You will need to have blood drawn on Tuesday, April 29 at [facility] [address], [place]  Open: 7am - 6pm, Sat 8am - 12 noon   Phone: [phone]. You do not need to be fasting.  4. Medication instructions in preparation for your procedure:   Contrast Allergy: No  PLEASE HOLD LASIX  AND ALDACTONE  THE MORNING OF YOUR PROCEDURE   On the morning of your procedure, any morning medicines NOT listed above.  You may use sips of water.  5. Plan to go home the same day, you will only stay overnight if medically necessary. 6. Bring a current list of your medications and current insurance cards. 7. You MUST have a responsible person to drive you home. 8. Someone  MUST be with you the first 24 hours after you arrive home or your discharge will be delayed. 9. Please wear clothes that are easy to get on and off and wear slip-on shoes.  Thank you for allowing us  to care for you!   -- [facility]  Follow-Up: Your physician recommends that you schedule a follow-up appointment in: 4 weeks post Cath   Any Other Special Instructions Will Be Listed Below (If Applicable).  If you need a refill on your cardiac medications before your next appointment, please call your pharmacy.

## 2024-04-21 NOTE — H&P (View-Only) (Signed)
 Cardiology Office Note:   Date: 04/21/2024 ID:  KIOR FLORIO, DOB 03-Jul-1956, MRN [id number] PCP:  [facility] Providers Cardiologist:  Teddie Favre, MD    Referring MD: [facility]   CC: Here for HFrEF follow-up  History of Present Illness:    Caroline Mason is a delightful 68 y.o. female with a PMH of chronic systolic CHF, secondary CM, HTN, OSA, COPD, RA, de Quervain's disease, tobacco abuse, anemia, fibromyalgia, mixed HLD, and diverticulosis, who presents today for scheduled follow-up.  In 2013, Echo revealed 45 to 50%, grade 1 DD, with diffuse hypokinesis, mild left atrial enlargement, was felt to be nonischemic as previous cath revealed normal coronary arteries.    Seen by Dr. Londa Rival on May 24, 2022.  She reported dyspnea with typical activities, was not fluid up. TTE 10/2022 EF to 25 to 30%, global hypokinesis of left ventricle, severely dilated left atrial size, trivial MR, otherwise no other significant valvular abnormalities. At follow-up, was discovered EF dropped d/t pt stopped taking Entresto  in the early part of summer due to not being able to afford Entresto , had restarted it shortly prior to office visit. Was tolerating all her medications well. Was smoking about 6 cigarettes/day.  Last seen on December 10, 2023. Was doing well at the time.   TTE was updated once on max titrated GDMT and revealed EF 20-25%, grade 1 DD, no significant valvular abnormalities.   03/10/2024 - She presents today for follow-up for HFrEF.  Doing well.  Admits to atypical "twinges" of chest pain, not bothersome per her report.  Admits to noticing some areas along bilateral medial hands of vitiligo. Denies any shortness of breath, syncope, presyncope, dizziness, orthopnea, PND, swelling or significant weight changes, acute bleeding, or claudication.  Does admit to some palpitations at times.  04/21/2024 -presents today for follow-up.  Admits  to some occasional, frequent but brief palpitations.  Says it feels like a funny feeling, lasting only for few seconds.  States that happens around 2-3 times per week, and sometimes multiple times per day.  Denies any overt sensation of fluid overload.  Does admit to use of 3 pillows at night when she is sleeping.  Says sometimes she does find herself trying to catch her breath, but denies any PND. Denies any chest pain, shortness of breath, syncope, presyncope, dizziness, PND, swelling or significant weight changes, acute bleeding, or claudication.  ROS:   Please see the history of present illness.    All other systems reviewed and are negative.  EKGs/Labs/Other Studies Reviewed:    The following studies were reviewed today:  EKG:  EKG Interpretation Date/Time:  Monday April 21 2024 09:37:07 EDT Ventricular Rate:  60 PR Interval:  190 QRS Duration:  104 QT Interval:  446 QTC Calculation: 446 R Axis:   -30  Text Interpretation: Sinus rhythm with occasional Premature ventricular complexes Left axis deviation Moderate voltage criteria for LVH, may be normal variant ( R in aVL , Cornell product ) Inferior infarct , age undetermined Anterolateral infarct (cited on or before 10-Dec-2023) When compared with ECG of 10-Dec-2023 14:11, Premature ventricular complexes are now Present QRS axis Shifted left Inferior infarct is now Present Questionable change in initial forces of Lateral leads Confirmed by Lasalle Pointer [phone]) on 04/21/2024 9:40:17 AM   cMRI 03/2024:  IMPRESSION: 1. Moderately dilated left ventricle with LV EF 31%. Diffuse hypokinesis.   2.  Normal RV size and systolic function, RV EF 50%.   3.  Mid-wall LGE in the basal-mid inferoseptal wall, basal inferior wall, and basal anterior wall. This is a non-coronary pattern of LGE, query prior myocarditis. Subendocardial LGE in the basal inferolateral wall, this may suggest an area of prior subendocardial MI.   4. Elevated  extracellular volume percentage at 35%, this suggests increased myocardial fibrosis.  Cardiac monitor 03/2024:  ZIO monitor reviewed.  13 days, 7 hours analyzed.   Predominant rhythm is sinus with heart rate ranging from 55 bpm up to 112 bpm and average heart rate 73 bpm. There were rare PACs including atrial couplets and triplets representing less than 1% total beats. There were occasional PVCs representing 4.8% total beats with otherwise rare ventricular couplets and triplets as well as limited ventricular bigeminy and trigeminy. Three brief episodes of NSVT were noted, the longest of which was 4 beats. Multiple (20) brief episodes of PSVT were noted, the longest of which lasted 17 beats.  There were no sustained arrhythmias. No pauses or high degree heart block.  Echo 02/2024:  1. Left ventricular ejection fraction, by estimation, is 20 to 25%. The  left ventricle has severely decreased function. The left ventricle  demonstrates global hypokinesis. The left ventricular internal cavity size  was mildly dilated. Left ventricular  diastolic parameters are consistent with Grade I diastolic dysfunction  (impaired relaxation). Elevated left atrial pressure. The average left  ventricular global longitudinal strain is -14.2 %. The global longitudinal  strain is abnormal.   2. Right ventricular systolic function is normal. The right ventricular  size is normal. There is normal pulmonary artery systolic pressure.   3. The mitral valve is normal in structure. No evidence of mitral valve  regurgitation. No evidence of mitral stenosis.   4. The aortic valve is tricuspid. Aortic valve regurgitation is not  visualized. No aortic stenosis is present.   5. The inferior vena cava is normal in size with greater than 50%  respiratory variability, suggesting right atrial pressure of 3 mmHg.   Comparison(s): A prior study was performed on 08/21/2023. EF 20-25%. IV  unsuccessful for use of  definity.  Limited echo 07/2023: 1. Limited Echo for LVEF assessment.   2. Left ventricular ejection fraction, by estimation, is 20 to 25%. The  left ventricle has severely decreased function. The left ventricle  demonstrates global hypokinesis. The left ventricular internal cavity size  was mildly dilated. There is mild left  ventricular hypertrophy. Left ventricular diastolic parameters are  consistent with Grade I diastolic dysfunction (impaired relaxation).  Consider Limited Echo with contrast to rule out LV thrombus.   3. Right ventricular systolic function is normal. The right ventricular  size is normal.   Comparison(s): No significant change from prior study.  Echocardiogram on November 23, 2022:  1. Left ventricular ejection fraction, by estimation, is 25 to 30%. The  left ventricle has severely decreased function. The left ventricle  demonstrates global hypokinesis. Left ventricular diastolic parameters are  indeterminate. Elevated left atrial  pressure. The average left ventricular global longitudinal strain is -14.0  %. The global longitudinal strain is abnormal.   2. Right ventricular systolic function is normal. The right ventricular  size is normal.   3. Left atrial size was severely dilated.   4. The mitral valve is normal in structure. Trivial mitral valve  regurgitation. No evidence of mitral stenosis.   5. The tricuspid valve is abnormal.   6. The aortic valve has an indeterminant number of cusps. Aortic valve  regurgitation is not visualized. No aortic  stenosis is present.   7. The inferior vena cava is normal in size with greater than 50%  respiratory variability, suggesting right atrial pressure of 3 mmHg.   Comparison(s): Echocardiogram done 09/08/21 showed an EF of 45-50%.  Physical Exam:    VS:  BP 138/88   Pulse 75   Ht 5' 5.5" (1.664 m)   Wt 232 lb 6.4 oz (105.4 kg)   SpO2 97%   BMI 38.09 kg/m     Wt Readings from Last 3 Encounters:  04/21/24  232 lb 6.4 oz (105.4 kg)  03/31/24 230 lb (104.3 kg)  03/10/24 231 lb (104.8 kg)    GEN: Obese, 68 y.o. female in no acute distress HEENT: Normal NECK: No JVD; No carotid bruits CARDIAC: S1/S2, RRR, no murmurs, rubs, gallops; 2+ peripheral pulses throughout, strong and equal bilaterally RESPIRATORY:  Clear to auscultation without rales, wheezing or rhonchi  MUSCULOSKELETAL:  No edema to BLE; No deformity SKIN: Warm and dry NEUROLOGIC:  Alert and oriented x 3 PSYCHIATRIC:  Normal affect   ASSESSMENT & PLAN:     HFrEF, NICM, preprocedure lab exam Stage C, NYHA class I symptoms.  Limited TTE 07/2023 revealed EF 20 to 25%, repeat Echo 02/2024 revealed EF 20 to 25%.  Etiology unclear.  Was felt to be nonischemic in nature in past.  Euvolemic and well compensated on exam.  See cardiac MRI results noted above.  I discussed the case with attending cardiologist and DOD, Dr. Londa Rival, who agreed with recommendation for right and left heart cath for definitive evaluation.  I discussed the risk versus benefits of this procedure with patient, and she verbalized understanding and is agreeable to proceed.  Will arrange.  Will also obtain CBC, CMET, and FLP prior to heart cath.  Will increase Coreg  to 6.25 mg twice daily. Continue Farxiga , spironolactone , Entresto , and Lasix . Low sodium diet, fluid restriction <2L, and daily weights encouraged. Educated to contact our office for weight gain of 2 lbs overnight or 5 lbs in one week. Heart healthy diet and regular cardiovascular exercise encouraged.    Informed Consent   Shared Decision Making/Informed Consent The risks [stroke (1 in 1000), death (1 in 1000), kidney failure [usually temporary] (1 in 500), bleeding (1 in 200), allergic reaction [possibly serious] (1 in 200)], benefits (diagnostic support and management of coronary artery disease) and alternatives of a cardiac catheterization were discussed in detail with Ms. Whitehurst and she is willing to  proceed.  3.  Palpitations Does admit to some palpitations at times.  See past monitor report noted above.  Will increase carvedilol  as noted above.   Heart healthy diet recommended.  4. HTN BP today stable. BP well controlled at home.  Increasing Coreg  as noted above.  Continue rest of medication regimen. Discussed to monitor BP at home at least 2 hours after medications and sitting for 5-10 minutes. Heart healthy diet and regular cardiovascular exercise encouraged.   5. Obesity Weight loss via diet and exercise encouraged. Discussed the impact being overweight would have on cardiovascular risk.  6. Tobacco abuse Smoking cessation encouraged and discussed.    5. Mixed HLD She has not had lab work obtained that was previously ordered last December.  She will obtain labs prior to heart cath and will obtain FLP and CMET as noted above.  Continue atorvastatin. Heart healthy diet and regular cardiovascular exercise encouraged.   5. Disposition: Follow-up with Dr. Londa Rival or APP in 4 weeks or sooner if anything changes.   Medication  Adjustments/Labs and Tests Ordered: Current medicines are reviewed at length with the patient today.  Concerns regarding medicines are outlined above.  Orders Placed This Encounter  Procedures   CBC   Comprehensive Metabolic Panel (CMET)   Lipid Profile   EKG 12-Lead   Meds ordered this encounter  Medications   carvedilol  (COREG ) 6.25 MG tablet    Sig: Take 1 tablet (6.25 mg total) by mouth 2 (two) times daily with a meal.    Dispense:  180 tablet    Refill:  1    Dose Increase 04/21/24    Patient Instructions  Medication Instructions:  Your physician has recommended you make the following change in your medication:  Please Increase Coreg  to 6.25 Mg Twice daily   Labwork: Today or tomorrow at [facility]   Testing/Procedures:  [facility] A DEPT OF [facility] [address] [place] [address] Dept: [phone] Loc: [phone]  INABELLE ENGEBRETSON  04/21/2024  You are scheduled for a Cardiac Catheterization on Wednesday, May 7 with Dr. Randene Bustard.  1. Please arrive at the [facility] (Main Entrance A) at [facility]: [address] [place], [place] [address] at 6:30 AM (This time is 2 hour(s) before your procedure to ensure your preparation).   Free valet parking service is available. You will check in at ADMITTING. The support person will be asked to wait in the waiting room.  It is OK to have someone drop you off and come back when you are ready to be discharged.    Special note: Every effort is made to have your procedure done on time. Please understand that emergencies sometimes delay scheduled procedures.  2. Diet: Do not eat solid foods after midnight.  The patient may have clear liquids until 5am upon the day of the procedure.  3. Labs: You will need to have blood drawn on Tuesday, April 29 at [facility] [address], [place]  Open: 7am - 6pm, Sat 8am - 12 noon   Phone: [phone]. You do not need to be fasting.  4. Medication instructions in preparation for your procedure:   Contrast Allergy: No  PLEASE HOLD LASIX  AND ALDACTONE  THE MORNING OF YOUR PROCEDURE   On the morning of your procedure, any morning medicines NOT listed above.  You may use sips of water.  5. Plan to go home the same day, you will only stay overnight if medically necessary. 6. Bring a current list of your medications and current insurance cards. 7. You MUST have a responsible person to drive you home. 8. Someone MUST be with you the first 24 hours after you arrive home or your discharge will be delayed. 9. Please wear clothes that are easy to get on and off and wear slip-on shoes.  Thank you for allowing us  to care for you!   -- [facility]  Follow-Up: Your physician recommends that you schedule a follow-up  appointment in: 4 weeks post Cath   Any Other Special Instructions Will Be Listed Below (If Applicable).  If you need a refill on your cardiac medications before your next appointment, please call your pharmacy.   Signed, Lasalle Pointer, NP

## 2024-04-21 NOTE — Telephone Encounter (Signed)
 Checking percert on the following patient for    Left and Right Heart Cath- 5/7 arrive 6:30 CATH 8:30 Dr.Harding

## 2024-04-21 NOTE — Progress Notes (Signed)
 Cardiology Office Note:   Date: 04/21/2024 ID:  Caroline Mason, DOB 03-Jul-1956, MRN [id number] PCP:  [facility] Providers Cardiologist:  Teddie Favre, MD    Referring MD: [facility]   CC: Here for HFrEF follow-up  History of Present Illness:    Caroline Mason is a delightful 68 y.o. female with a PMH of chronic systolic CHF, secondary CM, HTN, OSA, COPD, RA, de Quervain's disease, tobacco abuse, anemia, fibromyalgia, mixed HLD, and diverticulosis, who presents today for scheduled follow-up.  In 2013, Echo revealed 45 to 50%, grade 1 DD, with diffuse hypokinesis, mild left atrial enlargement, was felt to be nonischemic as previous cath revealed normal coronary arteries.    Seen by Dr. Londa Rival on May 24, 2022.  She reported dyspnea with typical activities, was not fluid up. TTE 10/2022 EF to 25 to 30%, global hypokinesis of left ventricle, severely dilated left atrial size, trivial MR, otherwise no other significant valvular abnormalities. At follow-up, was discovered EF dropped d/t pt stopped taking Entresto  in the early part of summer due to not being able to afford Entresto , had restarted it shortly prior to office visit. Was tolerating all her medications well. Was smoking about 6 cigarettes/day.  Last seen on December 10, 2023. Was doing well at the time.   TTE was updated once on max titrated GDMT and revealed EF 20-25%, grade 1 DD, no significant valvular abnormalities.   03/10/2024 - She presents today for follow-up for HFrEF.  Doing well.  Admits to atypical "twinges" of chest pain, not bothersome per her report.  Admits to noticing some areas along bilateral medial hands of vitiligo. Denies any shortness of breath, syncope, presyncope, dizziness, orthopnea, PND, swelling or significant weight changes, acute bleeding, or claudication.  Does admit to some palpitations at times.  04/21/2024 -presents today for follow-up.  Admits  to some occasional, frequent but brief palpitations.  Says it feels like a funny feeling, lasting only for few seconds.  States that happens around 2-3 times per week, and sometimes multiple times per day.  Denies any overt sensation of fluid overload.  Does admit to use of 3 pillows at night when she is sleeping.  Says sometimes she does find herself trying to catch her breath, but denies any PND. Denies any chest pain, shortness of breath, syncope, presyncope, dizziness, PND, swelling or significant weight changes, acute bleeding, or claudication.  ROS:   Please see the history of present illness.    All other systems reviewed and are negative.  EKGs/Labs/Other Studies Reviewed:    The following studies were reviewed today:  EKG:  EKG Interpretation Date/Time:  Monday April 21 2024 09:37:07 EDT Ventricular Rate:  60 PR Interval:  190 QRS Duration:  104 QT Interval:  446 QTC Calculation: 446 R Axis:   -30  Text Interpretation: Sinus rhythm with occasional Premature ventricular complexes Left axis deviation Moderate voltage criteria for LVH, may be normal variant ( R in aVL , Cornell product ) Inferior infarct , age undetermined Anterolateral infarct (cited on or before 10-Dec-2023) When compared with ECG of 10-Dec-2023 14:11, Premature ventricular complexes are now Present QRS axis Shifted left Inferior infarct is now Present Questionable change in initial forces of Lateral leads Confirmed by Lasalle Pointer [phone]) on 04/21/2024 9:40:17 AM   cMRI 03/2024:  IMPRESSION: 1. Moderately dilated left ventricle with LV EF 31%. Diffuse hypokinesis.   2.  Normal RV size and systolic function, RV EF 50%.   3.  Mid-wall LGE in the basal-mid inferoseptal wall, basal inferior wall, and basal anterior wall. This is a non-coronary pattern of LGE, query prior myocarditis. Subendocardial LGE in the basal inferolateral wall, this may suggest an area of prior subendocardial MI.   4. Elevated  extracellular volume percentage at 35%, this suggests increased myocardial fibrosis.  Cardiac monitor 03/2024:  ZIO monitor reviewed.  13 days, 7 hours analyzed.   Predominant rhythm is sinus with heart rate ranging from 55 bpm up to 112 bpm and average heart rate 73 bpm. There were rare PACs including atrial couplets and triplets representing less than 1% total beats. There were occasional PVCs representing 4.8% total beats with otherwise rare ventricular couplets and triplets as well as limited ventricular bigeminy and trigeminy. Three brief episodes of NSVT were noted, the longest of which was 4 beats. Multiple (20) brief episodes of PSVT were noted, the longest of which lasted 17 beats.  There were no sustained arrhythmias. No pauses or high degree heart block.  Echo 02/2024:  1. Left ventricular ejection fraction, by estimation, is 20 to 25%. The  left ventricle has severely decreased function. The left ventricle  demonstrates global hypokinesis. The left ventricular internal cavity size  was mildly dilated. Left ventricular  diastolic parameters are consistent with Grade I diastolic dysfunction  (impaired relaxation). Elevated left atrial pressure. The average left  ventricular global longitudinal strain is -14.2 %. The global longitudinal  strain is abnormal.   2. Right ventricular systolic function is normal. The right ventricular  size is normal. There is normal pulmonary artery systolic pressure.   3. The mitral valve is normal in structure. No evidence of mitral valve  regurgitation. No evidence of mitral stenosis.   4. The aortic valve is tricuspid. Aortic valve regurgitation is not  visualized. No aortic stenosis is present.   5. The inferior vena cava is normal in size with greater than 50%  respiratory variability, suggesting right atrial pressure of 3 mmHg.   Comparison(s): A prior study was performed on 08/21/2023. EF 20-25%. IV  unsuccessful for use of  definity.  Limited echo 07/2023: 1. Limited Echo for LVEF assessment.   2. Left ventricular ejection fraction, by estimation, is 20 to 25%. The  left ventricle has severely decreased function. The left ventricle  demonstrates global hypokinesis. The left ventricular internal cavity size  was mildly dilated. There is mild left  ventricular hypertrophy. Left ventricular diastolic parameters are  consistent with Grade I diastolic dysfunction (impaired relaxation).  Consider Limited Echo with contrast to rule out LV thrombus.   3. Right ventricular systolic function is normal. The right ventricular  size is normal.   Comparison(s): No significant change from prior study.  Echocardiogram on November 23, 2022:  1. Left ventricular ejection fraction, by estimation, is 25 to 30%. The  left ventricle has severely decreased function. The left ventricle  demonstrates global hypokinesis. Left ventricular diastolic parameters are  indeterminate. Elevated left atrial  pressure. The average left ventricular global longitudinal strain is -14.0  %. The global longitudinal strain is abnormal.   2. Right ventricular systolic function is normal. The right ventricular  size is normal.   3. Left atrial size was severely dilated.   4. The mitral valve is normal in structure. Trivial mitral valve  regurgitation. No evidence of mitral stenosis.   5. The tricuspid valve is abnormal.   6. The aortic valve has an indeterminant number of cusps. Aortic valve  regurgitation is not visualized. No aortic  stenosis is present.   7. The inferior vena cava is normal in size with greater than 50%  respiratory variability, suggesting right atrial pressure of 3 mmHg.   Comparison(s): Echocardiogram done 09/08/21 showed an EF of 45-50%.  Physical Exam:    VS:  BP 138/88   Pulse 75   Ht 5' 5.5" (1.664 m)   Wt 232 lb 6.4 oz (105.4 kg)   SpO2 97%   BMI 38.09 kg/m     Wt Readings from Last 3 Encounters:  04/21/24  232 lb 6.4 oz (105.4 kg)  03/31/24 230 lb (104.3 kg)  03/10/24 231 lb (104.8 kg)    GEN: Obese, 68 y.o. female in no acute distress HEENT: Normal NECK: No JVD; No carotid bruits CARDIAC: S1/S2, RRR, no murmurs, rubs, gallops; 2+ peripheral pulses throughout, strong and equal bilaterally RESPIRATORY:  Clear to auscultation without rales, wheezing or rhonchi  MUSCULOSKELETAL:  No edema to BLE; No deformity SKIN: Warm and dry NEUROLOGIC:  Alert and oriented x 3 PSYCHIATRIC:  Normal affect   ASSESSMENT & PLAN:     HFrEF, NICM, preprocedure lab exam Stage C, NYHA class I symptoms.  Limited TTE 07/2023 revealed EF 20 to 25%, repeat Echo 02/2024 revealed EF 20 to 25%.  Etiology unclear.  Was felt to be nonischemic in nature in past.  Euvolemic and well compensated on exam.  See cardiac MRI results noted above.  I discussed the case with attending cardiologist and DOD, Dr. Londa Rival, who agreed with recommendation for right and left heart cath for definitive evaluation.  I discussed the risk versus benefits of this procedure with patient, and she verbalized understanding and is agreeable to proceed.  Will arrange.  Will also obtain CBC, CMET, and FLP prior to heart cath.  Will increase Coreg  to 6.25 mg twice daily. Continue Farxiga , spironolactone , Entresto , and Lasix . Low sodium diet, fluid restriction <2L, and daily weights encouraged. Educated to contact our office for weight gain of 2 lbs overnight or 5 lbs in one week. Heart healthy diet and regular cardiovascular exercise encouraged.    Informed Consent   Shared Decision Making/Informed Consent The risks [stroke (1 in 1000), death (1 in 1000), kidney failure [usually temporary] (1 in 500), bleeding (1 in 200), allergic reaction [possibly serious] (1 in 200)], benefits (diagnostic support and management of coronary artery disease) and alternatives of a cardiac catheterization were discussed in detail with Caroline Mason and she is willing to  proceed.  3.  Palpitations Does admit to some palpitations at times.  See past monitor report noted above.  Will increase carvedilol  as noted above.   Heart healthy diet recommended.  4. HTN BP today stable. BP well controlled at home.  Increasing Coreg  as noted above.  Continue rest of medication regimen. Discussed to monitor BP at home at least 2 hours after medications and sitting for 5-10 minutes. Heart healthy diet and regular cardiovascular exercise encouraged.   5. Obesity Weight loss via diet and exercise encouraged. Discussed the impact being overweight would have on cardiovascular risk.  6. Tobacco abuse Smoking cessation encouraged and discussed.    5. Mixed HLD She has not had lab work obtained that was previously ordered last December.  She will obtain labs prior to heart cath and will obtain FLP and CMET as noted above.  Continue atorvastatin. Heart healthy diet and regular cardiovascular exercise encouraged.   5. Disposition: Follow-up with Dr. Londa Rival or APP in 4 weeks or sooner if anything changes.   Medication  Adjustments/Labs and Tests Ordered: Current medicines are reviewed at length with the patient today.  Concerns regarding medicines are outlined above.  Orders Placed This Encounter  Procedures   CBC   Comprehensive Metabolic Panel (CMET)   Lipid Profile   EKG 12-Lead   Meds ordered this encounter  Medications   carvedilol  (COREG ) 6.25 MG tablet    Sig: Take 1 tablet (6.25 mg total) by mouth 2 (two) times daily with a meal.    Dispense:  180 tablet    Refill:  1    Dose Increase 04/21/24    Patient Instructions  Medication Instructions:  Your physician has recommended you make the following change in your medication:  Please Increase Coreg  to 6.25 Mg Twice daily   Labwork: Today or tomorrow at [facility]   Testing/Procedures:  [facility] A DEPT OF [facility] [address] [place] [address] Dept: [phone] Loc: [phone]  Caroline Mason  04/21/2024  You are scheduled for a Cardiac Catheterization on Wednesday, May 7 with Dr. Randene Bustard.  1. Please arrive at the [facility] (Main Entrance A) at [facility]: [address] [place], [place] [address] at 6:30 AM (This time is 2 hour(s) before your procedure to ensure your preparation).   Free valet parking service is available. You will check in at ADMITTING. The support person will be asked to wait in the waiting room.  It is OK to have someone drop you off and come back when you are ready to be discharged.    Special note: Every effort is made to have your procedure done on time. Please understand that emergencies sometimes delay scheduled procedures.  2. Diet: Do not eat solid foods after midnight.  The patient may have clear liquids until 5am upon the day of the procedure.  3. Labs: You will need to have blood drawn on Tuesday, April 29 at [facility] [address], [place]  Open: 7am - 6pm, Sat 8am - 12 noon   Phone: [phone]. You do not need to be fasting.  4. Medication instructions in preparation for your procedure:   Contrast Allergy: No  PLEASE HOLD LASIX  AND ALDACTONE  THE MORNING OF YOUR PROCEDURE   On the morning of your procedure, any morning medicines NOT listed above.  You may use sips of water.  5. Plan to go home the same day, you will only stay overnight if medically necessary. 6. Bring a current list of your medications and current insurance cards. 7. You MUST have a responsible person to drive you home. 8. Someone MUST be with you the first 24 hours after you arrive home or your discharge will be delayed. 9. Please wear clothes that are easy to get on and off and wear slip-on shoes.  Thank you for allowing us  to care for you!   -- [facility]  Follow-Up: Your physician recommends that you schedule a follow-up  appointment in: 4 weeks post Cath   Any Other Special Instructions Will Be Listed Below (If Applicable).  If you need a refill on your cardiac medications before your next appointment, please call your pharmacy.   Signed, Lasalle Pointer, NP

## 2024-04-22 DIAGNOSIS — Z01812 Encounter for preprocedural laboratory examination: Secondary | ICD-10-CM | POA: Diagnosis not present

## 2024-04-22 DIAGNOSIS — I10 Essential (primary) hypertension: Secondary | ICD-10-CM | POA: Diagnosis not present

## 2024-04-22 DIAGNOSIS — I429 Cardiomyopathy, unspecified: Secondary | ICD-10-CM | POA: Diagnosis not present

## 2024-04-22 DIAGNOSIS — I5022 Chronic systolic (congestive) heart failure: Secondary | ICD-10-CM | POA: Diagnosis not present

## 2024-04-22 LAB — CBC
HCT: 39.8 % (ref 35.0–45.0)
Hemoglobin: 13.2 g/dL (ref 11.7–15.5)
MCH: 29.9 pg (ref 27.0–33.0)
MCHC: 33.2 g/dL (ref 32.0–36.0)
MCV: 90.2 fL (ref 80.0–100.0)
MPV: 10.1 fL (ref 7.5–12.5)
Platelets: 226 10*3/uL (ref 140–400)
RBC: 4.41 10*6/uL (ref 3.80–5.10)
RDW: 12.3 % (ref 11.0–15.0)
WBC: 6.7 10*3/uL (ref 3.8–10.8)

## 2024-04-22 LAB — COMPREHENSIVE METABOLIC PANEL WITH GFR
AG Ratio: 1.6 (calc) (ref 1.0–2.5)
ALT: 9 U/L (ref 6–29)
AST: 12 U/L (ref 10–35)
Albumin: 4.1 g/dL (ref 3.6–5.1)
Alkaline phosphatase (APISO): 100 U/L (ref 37–153)
BUN: 11 mg/dL (ref 7–25)
CO2: 27 mmol/L (ref 20–32)
Calcium: 8.8 mg/dL (ref 8.6–10.4)
Chloride: 107 mmol/L (ref 98–110)
Creat: 0.71 mg/dL (ref 0.50–1.05)
Globulin: 2.6 g/dL (ref 1.9–3.7)
Glucose, Bld: 79 mg/dL (ref 65–99)
Potassium: 4 mmol/L (ref 3.5–5.3)
Sodium: 141 mmol/L (ref 135–146)
Total Bilirubin: 0.7 mg/dL (ref 0.2–1.2)
Total Protein: 6.7 g/dL (ref 6.1–8.1)
eGFR: 94 mL/min/{1.73_m2} (ref >=60)

## 2024-04-22 LAB — LIPID PANEL
Cholesterol: 129 mg/dL (ref <200)
HDL: 63 mg/dL (ref >=50)
LDL Cholesterol (Calc): 52 mg/dL
Non-HDL Cholesterol (Calc): 66 mg/dL (ref <130)
Total CHOL/HDL Ratio: 2 (calc) (ref <5.0)
Triglycerides: 49 mg/dL (ref <150)

## 2024-04-28 ENCOUNTER — Telehealth: Payer: Self-pay | Admitting: *Deleted

## 2024-04-28 NOTE — Telephone Encounter (Addendum)
 Cardiac Catheterization scheduled at [facility] for: Wednesday Apr 30, 2024 8:30 AM Arrival time [facility] Main Entrance A at: 6:30 AM  Nothing to eat after midnight prior to procedure, clear liquids until 5 AM day of procedure.  Medication instructions: -Hold:  Lasix /Spironolactone /Farxiga -AM of procedure -Other usual morning medications can be taken with sips of water.  (No aspirin-listed as allergy/contraindication listed-GI upset.)  Plan to go home the same day, you will only stay overnight if medically necessary.  You must have responsible adult to drive you home.  Someone must be with you the first 24 hours after you arrive home.  Call placed to patient to review instructions, no answer, mailbox full, unable to leave message.   Instructions sent to patient via MyChart.

## 2024-04-30 ENCOUNTER — Encounter (HOSPITAL_COMMUNITY)
Admission: RE | Disposition: A | Discharge status: Home / Self Care | Payer: Self-pay | Source: Home / Self Care | Attending: Cardiology

## 2024-04-30 ENCOUNTER — Ambulatory Visit (HOSPITAL_COMMUNITY)
Admission: RE | Admit: 2024-04-30 | Discharge: 2024-04-30 | Disposition: A | Discharge status: Home / Self Care | Attending: Cardiology | Admitting: Cardiology

## 2024-04-30 ENCOUNTER — Other Ambulatory Visit: Payer: Self-pay

## 2024-04-30 DIAGNOSIS — Z79899 Other long term (current) drug therapy: Secondary | ICD-10-CM | POA: Insufficient documentation

## 2024-04-30 DIAGNOSIS — R9430 Abnormal result of cardiovascular function study, unspecified: Secondary | ICD-10-CM

## 2024-04-30 DIAGNOSIS — F17210 Nicotine dependence, cigarettes, uncomplicated: Secondary | ICD-10-CM | POA: Insufficient documentation

## 2024-04-30 DIAGNOSIS — I2510 Atherosclerotic heart disease of native coronary artery without angina pectoris: Secondary | ICD-10-CM | POA: Insufficient documentation

## 2024-04-30 DIAGNOSIS — I5022 Chronic systolic (congestive) heart failure: Secondary | ICD-10-CM | POA: Diagnosis not present

## 2024-04-30 DIAGNOSIS — I110 Hypertensive heart disease with heart failure: Secondary | ICD-10-CM | POA: Diagnosis not present

## 2024-04-30 DIAGNOSIS — E782 Mixed hyperlipidemia: Secondary | ICD-10-CM | POA: Insufficient documentation

## 2024-04-30 DIAGNOSIS — E669 Obesity, unspecified: Secondary | ICD-10-CM | POA: Diagnosis not present

## 2024-04-30 DIAGNOSIS — Z6838 Body mass index (BMI) 38.0-38.9, adult: Secondary | ICD-10-CM | POA: Insufficient documentation

## 2024-04-30 DIAGNOSIS — R002 Palpitations: Secondary | ICD-10-CM | POA: Insufficient documentation

## 2024-04-30 DIAGNOSIS — M797 Fibromyalgia: Secondary | ICD-10-CM | POA: Insufficient documentation

## 2024-04-30 DIAGNOSIS — I428 Other cardiomyopathies: Secondary | ICD-10-CM | POA: Insufficient documentation

## 2024-04-30 DIAGNOSIS — G4733 Obstructive sleep apnea (adult) (pediatric): Secondary | ICD-10-CM | POA: Diagnosis not present

## 2024-04-30 DIAGNOSIS — J449 Chronic obstructive pulmonary disease, unspecified: Secondary | ICD-10-CM | POA: Diagnosis not present

## 2024-04-30 DIAGNOSIS — I272 Pulmonary hypertension, unspecified: Secondary | ICD-10-CM | POA: Diagnosis not present

## 2024-04-30 HISTORY — PX: RIGHT/LEFT HEART CATH AND CORONARY ANGIOGRAPHY: CATH118266

## 2024-04-30 LAB — POCT I-STAT EG7
Acid-Base Excess: 0 mmol/L (ref 0.0–2.0)
Acid-Base Excess: 0 mmol/L (ref 0.0–2.0)
Acid-base deficit: 1 mmol/L (ref 0.0–2.0)
Bicarbonate: 25.1 mmol/L (ref 20.0–28.0)
Bicarbonate: 25.8 mmol/L (ref 20.0–28.0)
Bicarbonate: 26.1 mmol/L (ref 20.0–28.0)
Calcium, Ion: 1.14 mmol/L — ABNORMAL LOW (ref 1.15–1.40)
Calcium, Ion: 1.18 mmol/L (ref 1.15–1.40)
Calcium, Ion: 1.19 mmol/L (ref 1.15–1.40)
HCT: 39 % (ref 36.0–46.0)
HCT: 39 % (ref 36.0–46.0)
HCT: 39 % (ref 36.0–46.0)
Hemoglobin: 13.3 g/dL (ref 12.0–15.0)
Hemoglobin: 13.3 g/dL (ref 12.0–15.0)
Hemoglobin: 13.3 g/dL (ref 12.0–15.0)
O2 Saturation: 67 %
O2 Saturation: 67 %
O2 Saturation: 73 %
Potassium: 3.4 mmol/L — ABNORMAL LOW (ref 3.5–5.1)
Potassium: 3.6 mmol/L (ref 3.5–5.1)
Potassium: 3.6 mmol/L (ref 3.5–5.1)
Sodium: 143 mmol/L (ref 135–145)
Sodium: 143 mmol/L (ref 135–145)
Sodium: 144 mmol/L (ref 135–145)
TCO2: 27 mmol/L (ref 22–32)
TCO2: 27 mmol/L (ref 22–32)
TCO2: 28 mmol/L (ref 22–32)
pCO2, Ven: 46.3 mmHg (ref 44–60)
pCO2, Ven: 46.5 mmHg (ref 44–60)
pCO2, Ven: 47.7 mmHg (ref 44–60)
pH, Ven: 7.343 (ref 7.25–7.43)
pH, Ven: 7.346 (ref 7.25–7.43)
pH, Ven: 7.352 (ref 7.25–7.43)
pO2, Ven: 37 mmHg (ref 32–45)
pO2, Ven: 37 mmHg (ref 32–45)
pO2, Ven: 41 mmHg (ref 32–45)

## 2024-04-30 SURGERY — RIGHT/LEFT HEART CATH AND CORONARY ANGIOGRAPHY
Anesthesia: LOCAL

## 2024-04-30 MED ORDER — VERAPAMIL HCL 2.5 MG/ML IV SOLN
INTRAVENOUS | Status: DC | PRN
  Administered 2024-04-30: 10 mL via INTRA_ARTERIAL

## 2024-04-30 MED ORDER — SODIUM CHLORIDE 0.9 % IV SOLN: INTRAVENOUS | Status: DC

## 2024-04-30 MED ORDER — ONDANSETRON HCL 4 MG/2ML IJ SOLN: 4.0000 mg | Freq: Four times a day (QID) | INTRAMUSCULAR | Status: DC | PRN

## 2024-04-30 MED ORDER — HEPARIN (PORCINE) IN NACL 1000-0.9 UT/500ML-% IV SOLN
INTRAVENOUS | Status: DC | PRN
  Administered 2024-04-30 (×2): 500 mL

## 2024-04-30 MED ORDER — ASPIRIN 81 MG PO CHEW
81.0000 mg | CHEWABLE_TABLET | Freq: Once | ORAL | Status: AC
  Administered 2024-04-30: 81 mg via ORAL
  Filled 2024-04-30: qty 1

## 2024-04-30 MED ORDER — VERAPAMIL HCL 2.5 MG/ML IV SOLN
INTRAVENOUS | Status: AC
  Filled 2024-04-30: qty 2

## 2024-04-30 MED ORDER — SODIUM CHLORIDE 0.9 % IV SOLN: 250.0000 mL | INTRAVENOUS | Status: DC | PRN

## 2024-04-30 MED ORDER — SODIUM CHLORIDE 0.9% FLUSH: 3.0000 mL | INTRAVENOUS | Status: DC | PRN

## 2024-04-30 MED ORDER — LIDOCAINE HCL (PF) 1 % IJ SOLN
INTRAMUSCULAR | Status: AC
  Filled 2024-04-30: qty 30

## 2024-04-30 MED ORDER — IOHEXOL 350 MG/ML SOLN
INTRAVENOUS | Status: DC | PRN
  Administered 2024-04-30: 30 mL via INTRA_ARTERIAL

## 2024-04-30 MED ORDER — HEPARIN SODIUM (PORCINE) 1000 UNIT/ML IJ SOLN
INTRAMUSCULAR | Status: DC | PRN
  Administered 2024-04-30: 5000 [IU] via INTRAVENOUS

## 2024-04-30 MED ORDER — HEPARIN SODIUM (PORCINE) 1000 UNIT/ML IJ SOLN
INTRAMUSCULAR | Status: AC
  Filled 2024-04-30: qty 10

## 2024-04-30 MED ORDER — FUROSEMIDE 10 MG/ML IJ SOLN
INTRAMUSCULAR | Status: DC | PRN
  Administered 2024-04-30: 40 mg via INTRAVENOUS

## 2024-04-30 MED ORDER — FUROSEMIDE 10 MG/ML IJ SOLN
INTRAMUSCULAR | Status: AC
  Filled 2024-04-30: qty 4

## 2024-04-30 MED ORDER — HYDRALAZINE HCL 20 MG/ML IJ SOLN: 10.0000 mg | INTRAMUSCULAR | Status: DC | PRN

## 2024-04-30 MED ORDER — LABETALOL HCL 5 MG/ML IV SOLN: 10.0000 mg | INTRAVENOUS | Status: DC | PRN

## 2024-04-30 MED ORDER — LIDOCAINE HCL (PF) 1 % IJ SOLN
INTRAMUSCULAR | Status: DC | PRN
  Administered 2024-04-30: 5 mL

## 2024-04-30 MED ORDER — ACETAMINOPHEN 325 MG PO TABS: 650.0000 mg | ORAL_TABLET | ORAL | Status: DC | PRN

## 2024-04-30 MED ORDER — SODIUM CHLORIDE 0.9% FLUSH: 3.0000 mL | Freq: Two times a day (BID) | INTRAVENOUS | Status: DC

## 2024-04-30 SURGICAL SUPPLY — 9 items
CATH BALLN WEDGE 5F 110CM (CATHETERS) IMPLANT
CATH INFINITI AMBI 5FR TG (CATHETERS) IMPLANT
DEVICE RAD COMP TR BAND LRG (VASCULAR PRODUCTS) IMPLANT
GLIDESHEATH SLEND SS 6F .021 (SHEATH) IMPLANT
GUIDEWIRE INQWIRE 1.5J.035X260 (WIRE) IMPLANT
KIT SYRINGE INJ CVI SPIKEX1 (MISCELLANEOUS) IMPLANT
PACK CARDIAC CATHETERIZATION (CUSTOM PROCEDURE TRAY) ×1 IMPLANT
SET ATX-X65L (MISCELLANEOUS) IMPLANT
SHEATH GLIDE SLENDER 4/5FR (SHEATH) IMPLANT

## 2024-04-30 NOTE — Interval H&P Note (Signed)
 History and Physical Interval Note:  04/30/2024 8:10 AM  Caroline Mason  has presented today for surgery, with the diagnosis of cardiomyopathy => Abnormal Stress PET  The various methods of treatment have been discussed with the patient and family. After consideration of risks, benefits and other options for treatment, the patient has consented to  Procedure(s): RIGHT/LEFT HEART CATH AND CORONARY ANGIOGRAPHY (N/A)  PERCUTANEOUS CORONARY INTERVENTION  as a surgical intervention.  The patient's history has been reviewed, patient examined, no change in status, stable for surgery.  I have reviewed the patient's chart and labs.  Questions were answered to the patient's satisfaction.    Cath Lab Visit (complete for each Cath Lab visit)  Clinical Evaluation Leading to the Procedure:   ACS: No.  Non-ACS:    Anginal Classification: CCS II  Anti-ischemic medical therapy: Maximal Therapy (2 or more classes of medications)  Non-Invasive Test Results: Equivocal test results  Prior CABG: No previous CABG     Randene Bustard

## 2024-05-01 ENCOUNTER — Encounter (HOSPITAL_COMMUNITY): Payer: Self-pay | Admitting: Cardiology

## 2024-05-01 LAB — POCT I-STAT 7, (LYTES, BLD GAS, ICA,H+H)
Acid-base deficit: 1 mmol/L (ref 0.0–2.0)
Bicarbonate: 24.5 mmol/L (ref 20.0–28.0)
Calcium, Ion: 1.19 mmol/L (ref 1.15–1.40)
HCT: 39 % (ref 36.0–46.0)
Hemoglobin: 13.3 g/dL (ref 12.0–15.0)
O2 Saturation: 96 %
Potassium: 3.6 mmol/L (ref 3.5–5.1)
Sodium: 142 mmol/L (ref 135–145)
TCO2: 26 mmol/L (ref 22–32)
pCO2 arterial: 42.5 mmHg (ref 32–48)
pH, Arterial: 7.37 (ref 7.35–7.45)
pO2, Arterial: 87 mmHg (ref 83–108)

## 2024-05-06 ENCOUNTER — Ambulatory Visit: Payer: Self-pay

## 2024-05-20 ENCOUNTER — Other Ambulatory Visit: Payer: Self-pay

## 2024-05-20 MED ORDER — FOLIC ACID 1 MG PO TABS: 2.0000 mg | ORAL_TABLET | Freq: Every day | ORAL | 3 refills | Status: AC

## 2024-05-20 NOTE — Telephone Encounter (Signed)
 Last Fill: 03/21/2023  Next Visit: 09/01/2024  Last Visit: 03/31/2024  Dx: Rheumatoid arthritis with rheumatoid factor of multiple sites without organ or systems involvement   Current Dose per office note on 03/31/2024: folic acid  2 mg daily   Okay to refill Folic Acid ?

## 2024-05-29 ENCOUNTER — Encounter: Payer: Self-pay | Admitting: Nurse Practitioner

## 2024-05-29 ENCOUNTER — Ambulatory Visit: Attending: Nurse Practitioner | Admitting: Nurse Practitioner

## 2024-05-29 VITALS — BP 126/74 | HR 74 | Ht 65.5 in | Wt 230.0 lb

## 2024-05-29 DIAGNOSIS — E669 Obesity, unspecified: Secondary | ICD-10-CM

## 2024-05-29 DIAGNOSIS — I428 Other cardiomyopathies: Secondary | ICD-10-CM | POA: Diagnosis not present

## 2024-05-29 DIAGNOSIS — E782 Mixed hyperlipidemia: Secondary | ICD-10-CM | POA: Diagnosis not present

## 2024-05-29 DIAGNOSIS — I10 Essential (primary) hypertension: Secondary | ICD-10-CM | POA: Diagnosis not present

## 2024-05-29 DIAGNOSIS — R002 Palpitations: Secondary | ICD-10-CM | POA: Diagnosis not present

## 2024-05-29 DIAGNOSIS — Z720 Tobacco use: Secondary | ICD-10-CM | POA: Diagnosis not present

## 2024-05-29 DIAGNOSIS — I5020 Unspecified systolic (congestive) heart failure: Secondary | ICD-10-CM

## 2024-05-29 NOTE — Progress Notes (Signed)
 Cardiology Office Note:   Date: 05/29/2024 ID:  Caroline Mason, DOB 26-Nov-1956, MRN [id number] PCP:  [facility] Providers Cardiologist:  Teddie Favre, MD    Referring MD: [facility]   CC: Here for HFrEF follow-up  History of Present Illness:    Caroline Mason is a delightful 68 y.o. female with a PMH of chronic systolic CHF, secondary CM, HTN, OSA, COPD, RA, de Quervain's disease, tobacco abuse, anemia, fibromyalgia, mixed HLD, and diverticulosis, who presents today for scheduled follow-up.  In 2013, Echo revealed 45 to 50%, grade 1 DD, with diffuse hypokinesis, mild left atrial enlargement, was felt to be nonischemic as previous cath revealed normal coronary arteries.    Seen by Dr. Londa Rival on May 24, 2022.  She reported dyspnea with typical activities, was not fluid up. TTE 10/2022 EF to 25 to 30%, global hypokinesis of left ventricle, severely dilated left atrial size, trivial MR, otherwise no other significant valvular abnormalities. At follow-up, was discovered EF dropped d/t pt stopped taking Entresto  in the early part of summer due to not being able to afford Entresto , had restarted it shortly prior to office visit. Was tolerating all her medications well. Was smoking about 6 cigarettes/day.  Last seen on December 10, 2023. Was doing well at the time.   TTE was updated once on max titrated GDMT and revealed EF 20-25%, grade 1 DD, no significant valvular abnormalities.   03/10/2024 - She presents today for follow-up for HFrEF.  Doing well.  Admits to atypical "twinges" of chest pain, not bothersome per her report.  Admits to noticing some areas along bilateral medial hands of vitiligo. Denies any shortness of breath, syncope, presyncope, dizziness, orthopnea, PND, swelling or significant weight changes, acute bleeding, or claudication.  Does admit to some palpitations at times.  04/21/2024 -presents today for follow-up.  Admits  to some occasional, frequent but brief palpitations.  Says it feels like a funny feeling, lasting only for few seconds.  States that happens around 2-3 times per week, and sometimes multiple times per day.  Denies any overt sensation of fluid overload.  Does admit to use of 3 pillows at night when she is sleeping.  Says sometimes she does find herself trying to catch her breath, but denies any PND. Denies any chest pain, shortness of breath, syncope, presyncope, dizziness, PND, swelling or significant weight changes, acute bleeding, or claudication.  Here today for follow-up.  Doing well since I last saw her.  Palpitations have improved.  Says she is overall feeling very well. Denies any chest pain, shortness of breath, syncope, presyncope, dizziness, orthopnea, PND, swelling or significant weight changes, acute bleeding, or claudication.  ROS:   Please see the history of present illness.    All other systems reviewed and are negative.  EKGs/Labs/Other Studies Reviewed:    The following studies were reviewed today:  EKG: EKG is not ordered today.  Right/left heart cath 04/2024:    Ost LAD to Prox LAD lesion is 20% stenosed.   Hemodynamic findings consistent with moderate pulmonary hypertension.   There is no aortic valve stenosis.   In the absence of any other complications or medical issues, we expect the patient to be ready for discharge from a cath perspective on 04/30/2024.   Continue titrate afterload reduction.  Would recommend daily diuretic with PRN dosing.   No indication for antiplatelet therapy at this time .   Dominance: Right    Angiographically normal coronary arteries with  a codominant system Moderate Pulmonary Hypertension- WHO Class II: PAP-mean 44/26-35 mmHg with a PCWP of 26 mmHg and LVEDP of 32 mmHg.  Cardiac output of index 5.44-2.55.      RECOMMENDATIONS   In the absence of any other complications or medical issues, we expect the patient to be ready for discharge from  a cath perspective on 04/30/2024.   Continue titrate afterload reduction.  Would recommend daily diuretic with PRN dosing.   No indication for antiplatelet therapy at this time .    cMRI 03/2024:  IMPRESSION: 1. Moderately dilated left ventricle with LV EF 31%. Diffuse hypokinesis.   2.  Normal RV size and systolic function, RV EF 50%.   3. Mid-wall LGE in the basal-mid inferoseptal wall, basal inferior wall, and basal anterior wall. This is a non-coronary pattern of LGE, query prior myocarditis. Subendocardial LGE in the basal inferolateral wall, this may suggest an area of prior subendocardial MI.   4. Elevated extracellular volume percentage at 35%, this suggests increased myocardial fibrosis.  Cardiac monitor 03/2024:  ZIO monitor reviewed.  13 days, 7 hours analyzed.   Predominant rhythm is sinus with heart rate ranging from 55 bpm up to 112 bpm and average heart rate 73 bpm. There were rare PACs including atrial couplets and triplets representing less than 1% total beats. There were occasional PVCs representing 4.8% total beats with otherwise rare ventricular couplets and triplets as well as limited ventricular bigeminy and trigeminy. Three brief episodes of NSVT were noted, the longest of which was 4 beats. Multiple (20) brief episodes of PSVT were noted, the longest of which lasted 17 beats.  There were no sustained arrhythmias. No pauses or high degree heart block.  Echo 02/2024:  1. Left ventricular ejection fraction, by estimation, is 20 to 25%. The  left ventricle has severely decreased function. The left ventricle  demonstrates global hypokinesis. The left ventricular internal cavity size  was mildly dilated. Left ventricular  diastolic parameters are consistent with Grade I diastolic dysfunction  (impaired relaxation). Elevated left atrial pressure. The average left  ventricular global longitudinal strain is -14.2 %. The global longitudinal  strain is abnormal.   2.  Right ventricular systolic function is normal. The right ventricular  size is normal. There is normal pulmonary artery systolic pressure.   3. The mitral valve is normal in structure. No evidence of mitral valve  regurgitation. No evidence of mitral stenosis.   4. The aortic valve is tricuspid. Aortic valve regurgitation is not  visualized. No aortic stenosis is present.   5. The inferior vena cava is normal in size with greater than 50%  respiratory variability, suggesting right atrial pressure of 3 mmHg.   Comparison(s): A prior study was performed on 08/21/2023. EF 20-25%. IV  unsuccessful for use of definity.  Limited echo 07/2023: 1. Limited Echo for LVEF assessment.   2. Left ventricular ejection fraction, by estimation, is 20 to 25%. The  left ventricle has severely decreased function. The left ventricle  demonstrates global hypokinesis. The left ventricular internal cavity size  was mildly dilated. There is mild left  ventricular hypertrophy. Left ventricular diastolic parameters are  consistent with Grade I diastolic dysfunction (impaired relaxation).  Consider Limited Echo with contrast to rule out LV thrombus.   3. Right ventricular systolic function is normal. The right ventricular  size is normal.   Comparison(s): No significant change from prior study.  Echocardiogram on November 23, 2022:  1. Left ventricular ejection fraction, by estimation, is  25 to 30%. The  left ventricle has severely decreased function. The left ventricle  demonstrates global hypokinesis. Left ventricular diastolic parameters are  indeterminate. Elevated left atrial  pressure. The average left ventricular global longitudinal strain is -14.0  %. The global longitudinal strain is abnormal.   2. Right ventricular systolic function is normal. The right ventricular  size is normal.   3. Left atrial size was severely dilated.   4. The mitral valve is normal in structure. Trivial mitral valve   regurgitation. No evidence of mitral stenosis.   5. The tricuspid valve is abnormal.   6. The aortic valve has an indeterminant number of cusps. Aortic valve  regurgitation is not visualized. No aortic stenosis is present.   7. The inferior vena cava is normal in size with greater than 50%  respiratory variability, suggesting right atrial pressure of 3 mmHg.   Comparison(s): Echocardiogram done 09/08/21 showed an EF of 45-50%.  Physical Exam:    VS:  BP 126/74   Pulse 74   Ht 5' 5.5" (1.664 m)   Wt 230 lb (104.3 kg)   SpO2 98%   BMI 37.69 kg/m     Wt Readings from Last 3 Encounters:  05/29/24 230 lb (104.3 kg)  04/30/24 232 lb (105.2 kg)  04/21/24 232 lb 6.4 oz (105.4 kg)    GEN: Obese, 68 y.o. female in no acute distress HEENT: Normal NECK: No JVD; No carotid bruits CARDIAC: S1/S2, RRR, no murmurs, rubs, gallops; 2+ peripheral pulses throughout, strong and equal bilaterally RESPIRATORY:  Clear to auscultation without rales, wheezing or rhonchi  MUSCULOSKELETAL:  No edema to BLE; No deformity SKIN: Warm and dry NEUROLOGIC:  Alert and oriented x 3 PSYCHIATRIC:  Normal affect   ASSESSMENT & PLAN:     HFrEF, NICM Stage C, NYHA class I symptoms.  Limited TTE 07/2023 revealed EF 20 to 25%, repeat Echo 02/2024 revealed EF 20 to 25%.  NICM. Euvolemic and well compensated on exam.  See cardiac MRI results noted above.  Continue current medication regimen.  Low sodium diet, fluid restriction <2L, and daily weights encouraged. Educated to contact our office for weight gain of 2 lbs overnight or 5 lbs in one week. Heart healthy diet and regular cardiovascular exercise encouraged. If EF does not improve with next Echo, consider EP referral in future.   3.  Palpitations Palpitations have improved on current dose of carvedilol .  No medication changes at this time.   Heart healthy diet recommended.  4. HTN BP today stable. Continue current medication regimen. Discussed to monitor BP at  home at least 2 hours after medications and sitting for 5-10 minutes. Heart healthy diet and regular cardiovascular exercise encouraged.   5. Obesity Weight loss via diet and exercise encouraged. Discussed the impact being overweight would have on cardiovascular risk.  6. Tobacco abuse Smoking cessation encouraged and discussed.    7. Mixed HLD Most recent LDL 52.  She is currently at goal.  No medication changes at this time.  Heart healthy diet and regular cardiovascular exercise encouraged.    Disposition: Follow-up with Dr. Londa Rival or APP in 6 months or sooner if anything changes.   Medication Adjustments/Labs and Tests Ordered: Current medicines are reviewed at length with the patient today.  Concerns regarding medicines are outlined above.  No orders of the defined types were placed in this encounter.  No orders of the defined types were placed in this encounter.   Patient Instructions  Medication Instructions:  Continue all current medications.   Labwork:  none  Testing/Procedures:  none  Follow-Up:  6 months   Any Other Special Instructions Will Be Listed Below (If Applicable).  Mediterranean diet info given   If you need a refill on your cardiac medications before your next appointment, please call your pharmacy.    Signed, Lasalle Pointer, NP

## 2024-05-29 NOTE — Patient Instructions (Signed)
 Medication Instructions:   Continue all current medications.   Labwork:  none  Testing/Procedures:  none  Follow-Up:  6 months   Any Other Special Instructions Will Be Listed Below (If Applicable).  Mediterranean diet info given   If you need a refill on your cardiac medications before your next appointment, please call your pharmacy.

## 2024-06-19 ENCOUNTER — Other Ambulatory Visit: Payer: Self-pay | Admitting: Physician Assistant

## 2024-06-19 NOTE — Telephone Encounter (Signed)
 Last Fill: 10/31/2023  Eye exam: 05/23/2023 WNL   Contacted patient about eye exam she has an appointment in August.  Labs: 04/22/2024 CBC and CMP WNL   Next Visit: 09/01/2024  Last Visit: 03/31/2024  DX: Rheumatoid arthritis with rheumatoid factor of multiple sites without organ or systems involvement   Current Dose per office note 03/31/2024:  Plaquenil  200 mg 1 tablet by mouth twice daily   Okay to refill Plaquenil ?

## 2024-06-24 ENCOUNTER — Other Ambulatory Visit (INDEPENDENT_AMBULATORY_CARE_PROVIDER_SITE_OTHER): Payer: Self-pay | Admitting: Gastroenterology

## 2024-06-24 DIAGNOSIS — R195 Other fecal abnormalities: Secondary | ICD-10-CM

## 2024-07-04 DIAGNOSIS — Z20822 Contact with and (suspected) exposure to covid-19: Secondary | ICD-10-CM | POA: Diagnosis not present

## 2024-07-04 DIAGNOSIS — U071 COVID-19: Secondary | ICD-10-CM | POA: Diagnosis not present

## 2024-07-04 DIAGNOSIS — I471 Supraventricular tachycardia, unspecified: Secondary | ICD-10-CM | POA: Diagnosis not present

## 2024-07-22 ENCOUNTER — Telehealth: Payer: Self-pay | Admitting: Nurse Practitioner

## 2024-07-22 NOTE — Telephone Encounter (Signed)
*  STAT* If patient is at the pharmacy, call can be transferred to refill team.   1. Which medications need to be refilled? (please list name of each medication and dose if known)   furosemide  (LASIX ) 20 MG tablet    2. Which pharmacy/location (including street and city if local pharmacy) is medication to be sent to?  [facility] [phone] - [place], [place] - [address] AT [facility] [address]      3. Do they need a 30 day or 90 day supply? 90 day    Pt is out of this medication

## 2024-07-23 MED ORDER — FUROSEMIDE 20 MG PO TABS: 20.0000 mg | ORAL_TABLET | Freq: Every day | ORAL | 2 refills | Status: DC

## 2024-07-23 NOTE — Telephone Encounter (Signed)
 Rx sent to pharmacy

## 2024-07-31 ENCOUNTER — Other Ambulatory Visit: Payer: Self-pay | Admitting: Physician Assistant

## 2024-07-31 DIAGNOSIS — Z79899 Other long term (current) drug therapy: Secondary | ICD-10-CM

## 2024-07-31 NOTE — Telephone Encounter (Signed)
 Last Fill: 04/01/2024  Labs: 04/22/2024 CBC WNL CMP WNL  Next Visit: 09/01/2024  Last Visit: 03/31/2024  DX: Rheumatoid arthritis with rheumatoid factor of multiple sites without organ or systems involvement.  Current Dose per office note 03/31/2024: methotrexate  8 tablets by mouth once weekly,   Okay to refill Methotrexate ?

## 2024-07-31 NOTE — Telephone Encounter (Signed)
 Last Fill: 04/01/2024  Labs: 04/22/2024 WNL  Next Visit: 09/01/2024  Last Visit: 03/31/2024  DX:  Rheumatoid arthritis with rheumatoid factor of multiple sites without organ or systems involvement   Current Dose per office note 03/31/2024: Methotrexate  8 tablets by mouth once weekly   Patient advised she is due to update her lab work. Patient states she will update them tomorrow or Monday.   Okay to refill Methotrexate ?

## 2024-08-04 ENCOUNTER — Other Ambulatory Visit: Payer: Self-pay | Admitting: Nurse Practitioner

## 2024-08-04 DIAGNOSIS — Z79899 Other long term (current) drug therapy: Secondary | ICD-10-CM | POA: Diagnosis not present

## 2024-08-05 ENCOUNTER — Ambulatory Visit: Payer: Self-pay | Admitting: Physician Assistant

## 2024-08-05 DIAGNOSIS — H25813 Combined forms of age-related cataract, bilateral: Secondary | ICD-10-CM | POA: Diagnosis not present

## 2024-08-05 DIAGNOSIS — M069 Rheumatoid arthritis, unspecified: Secondary | ICD-10-CM | POA: Diagnosis not present

## 2024-08-05 DIAGNOSIS — Z79899 Other long term (current) drug therapy: Secondary | ICD-10-CM | POA: Diagnosis not present

## 2024-08-05 DIAGNOSIS — H10413 Chronic giant papillary conjunctivitis, bilateral: Secondary | ICD-10-CM | POA: Diagnosis not present

## 2024-08-05 DIAGNOSIS — H04123 Dry eye syndrome of bilateral lacrimal glands: Secondary | ICD-10-CM | POA: Diagnosis not present

## 2024-08-05 LAB — CBC WITH DIFFERENTIAL/PLATELET
Absolute Lymphocytes: 1945 {cells}/uL (ref 850–3900)
Absolute Monocytes: 469 {cells}/uL (ref 200–950)
Basophils Absolute: 27 {cells}/uL (ref 0–200)
Basophils Relative: 0.4 %
Eosinophils Absolute: 88 {cells}/uL (ref 15–500)
Eosinophils Relative: 1.3 %
HCT: 38.6 % (ref 35.0–45.0)
Hemoglobin: 12.5 g/dL (ref 11.7–15.5)
MCH: 29.6 pg (ref 27.0–33.0)
MCHC: 32.4 g/dL (ref 32.0–36.0)
MCV: 91.5 fL (ref 80.0–100.0)
MPV: 9.7 fL (ref 7.5–12.5)
Monocytes Relative: 6.9 %
Neutro Abs: 4270 {cells}/uL (ref 1500–7800)
Neutrophils Relative %: 62.8 %
Platelets: 212 Thousand/uL (ref 140–400)
RBC: 4.22 Million/uL (ref 3.80–5.10)
RDW: 13.7 % (ref 11.0–15.0)
Total Lymphocyte: 28.6 %
WBC: 6.8 Thousand/uL (ref 3.8–10.8)

## 2024-08-05 LAB — COMPREHENSIVE METABOLIC PANEL WITH GFR
AG Ratio: 1.6 (calc) (ref 1.0–2.5)
ALT: 11 U/L (ref 6–29)
AST: 12 U/L (ref 10–35)
Albumin: 4 g/dL (ref 3.6–5.1)
Alkaline phosphatase (APISO): 97 U/L (ref 37–153)
BUN: 10 mg/dL (ref 7–25)
CO2: 28 mmol/L (ref 20–32)
Calcium: 8.6 mg/dL (ref 8.6–10.4)
Chloride: 107 mmol/L (ref 98–110)
Creat: 0.83 mg/dL (ref 0.50–1.05)
Globulin: 2.5 g/dL (ref 1.9–3.7)
Glucose, Bld: 86 mg/dL (ref 65–99)
Potassium: 3.8 mmol/L (ref 3.5–5.3)
Sodium: 142 mmol/L (ref 135–146)
Total Bilirubin: 0.6 mg/dL (ref 0.2–1.2)
Total Protein: 6.5 g/dL (ref 6.1–8.1)
eGFR: 78 mL/min/1.73m2 (ref >=60)

## 2024-08-05 NOTE — Progress Notes (Signed)
 CBC and CMP WNL

## 2024-08-11 ENCOUNTER — Other Ambulatory Visit: Payer: Self-pay | Admitting: Nurse Practitioner

## 2024-08-12 DIAGNOSIS — M79672 Pain in left foot: Secondary | ICD-10-CM | POA: Diagnosis not present

## 2024-08-12 DIAGNOSIS — M79671 Pain in right foot: Secondary | ICD-10-CM | POA: Diagnosis not present

## 2024-08-12 DIAGNOSIS — M7732 Calcaneal spur, left foot: Secondary | ICD-10-CM | POA: Diagnosis not present

## 2024-08-12 DIAGNOSIS — M7731 Calcaneal spur, right foot: Secondary | ICD-10-CM | POA: Diagnosis not present

## 2024-08-12 DIAGNOSIS — M7662 Achilles tendinitis, left leg: Secondary | ICD-10-CM | POA: Diagnosis not present

## 2024-08-12 DIAGNOSIS — M7661 Achilles tendinitis, right leg: Secondary | ICD-10-CM | POA: Diagnosis not present

## 2024-08-18 NOTE — Progress Notes (Signed)
 Office Visit Note  Patient: Caroline Mason             Date of Birth: [birth year]           MRN: [id number]             PCP: [facility] Referring: [facility] Visit Date: 09/01/2024 Occupation: @GUAROCC @  Subjective:  Right ankle pain  History of Present Illness: Caroline Mason is a 68 y.o. female with history of seropositive rheumatoid arthritis and osteoarthritis.  Patient remains on Methotrexate  8 tablets by mouth once weekly, folic acid  2 mg daily, and Plaquenil  200 mg 1 tablet by mouth twice daily.  She is followed in combination therapy without any side effects and has not had any gaps in therapy.  She denies any signs or symptoms of a rheumatoid arthritis flare.  No recurrence of inflammation in her wrist joints.  Overall she has found methotrexate  to be effective at helping to manage her symptoms of rheumatoid arthritis.   Patient reports that she has been under the care of podiatry for management of pain in the right foot.  According to the patient she has had updated x-rays which revealed severe spurring.  Patient states for the past 2 weeks she has been experiencing increased pain of the right ankle joint.  She states she is also noticing redness and swelling of the right ankle.  Patient states that the podiatrist had discussed proceeding with an MRI recommended further evaluation with rheumatology first.  Patient states last week she was having difficulty ambulating due to severity of symptoms.  Patient has been icing her right ankle which has been helpful.  She has not had an injection and has not been taking any over-the-counter products for symptomatic relief.  She continues to experience discomfort in the left knee joint.  Patient underwent viscosupplementation for the left knee in March/April 2025.  She is not yet ready to proceed with another Visco series.   She denies any other joint pain or joint swelling at this time. She denies any recent or  recurrent infections.    Activities of Daily Living:  Patient reports morning stiffness for 2 hours.   Patient Reports nocturnal pain.  Difficulty dressing/grooming: Denies Difficulty climbing stairs: Reports Difficulty getting out of chair: Reports Difficulty using hands for taps, buttons, cutlery, and/or writing: Denies  Review of Systems  Constitutional:  Positive for fatigue.  HENT:  Negative for mouth sores and mouth dryness.   Eyes:  Positive for dryness.  Respiratory:  Positive for cough and shortness of breath.   Cardiovascular:  Positive for palpitations. Negative for chest pain.  Gastrointestinal:  Negative for blood in stool, constipation and diarrhea.  Endocrine: Negative for increased urination.  Genitourinary:  Negative for involuntary urination.  Musculoskeletal:  Positive for joint pain, gait problem, joint pain, joint swelling, myalgias, muscle weakness, morning stiffness, muscle tenderness and myalgias.  Skin:  Positive for color change. Negative for rash, hair loss and sensitivity to sunlight.  Allergic/Immunologic: Negative for susceptible to infections.  Neurological:  Positive for headaches. Negative for dizziness.  Hematological:  Negative for swollen glands.  Psychiatric/Behavioral:  Positive for sleep disturbance. Negative for depressed mood. The patient is not nervous/anxious.     PMFS History:  Patient Active Problem List   Diagnosis Date Noted   Allergy to alpha-gal 09/25/2023   Chronic diarrhea 01/29/2023   Abdominal pain 01/29/2023   IBS (irritable bowel syndrome) 01/29/2023   Everitt Curt disease (radial styloid  tenosynovitis) 09/14/2022   Abdominal pain, left lower quadrant 06/08/2022   Loose stools 06/08/2022   Unspecified rotator cuff tear or rupture of right shoulder, not specified as traumatic 02/15/2022   Ganglion cyst of wrist, right 08/14/2018   Trigger thumb, right thumb 03/27/2018   High risk medication use 12/01/2016   Rheumatoid  arthritis with rheumatoid factor of multiple sites without organ or systems involvement (HCC) 12/01/2016   Fibromyalgia 12/01/2016   Primary osteoarthritis of both hands 12/01/2016   Primary osteoarthritis of both knees 12/01/2016   Primary osteoarthritis of both feet 12/01/2016   Bradycardia 07/18/2011   Chronic systolic heart failure (HCC) 06/05/2011   ANEMIA 03/01/2011   TOBACCO ABUSE 01/03/2011   OBSTRUCTIVE SLEEP APNEA 01/03/2011   ESSENTIAL HYPERTENSION, BENIGN 01/03/2011   Secondary cardiomyopathy (HCC) 01/03/2011   Other specified chronic obstructive pulmonary disease (HCC) 01/03/2011    Past Medical History:  Diagnosis Date   Atypical pneumonia    12/11   Cardiomyopathy    Nonischemic, normal coronaries, LVEF 40-45% >> LVEF 25-30% 10/2022   COPD (chronic obstructive pulmonary disease) (HCC)    Depression    Diverticulitis    per patient   Diverticulosis    Essential hypertension    Fibromyalgia    Heel spur    bilateral per patient   Morbid obesity (HCC)    Obstructive sleep apnea    Rheumatoid arthritis(714.0)     Family History  Problem Relation Age of Onset   Stroke Mother    Hypertension Mother    Heart failure Sister 17   Breast cancer Sister    Heart failure Sister 54   Cancer Sister    Ovarian cancer Sister    Diabetes Maternal Grandmother    Diabetes Paternal Grandfather    Past Surgical History:  Procedure Laterality Date   COLONOSCOPY WITH PROPOFOL  N/A 07/25/2022   Procedure: COLONOSCOPY WITH PROPOFOL ;  Surgeon: Eartha Angelia Sieving, MD;  Location: [facility];  Service: Gastroenterology;  Laterality: N/A;  815   CYST REMOVAL TRUNK     POLYPECTOMY  07/25/2022   Procedure: POLYPECTOMY;  Surgeon: Eartha Angelia Sieving, MD;  Location: [facility];  Service: Gastroenterology;;  cecal and ascending;descending   RIGHT/LEFT HEART CATH AND CORONARY ANGIOGRAPHY N/A 04/30/2024   Procedure: RIGHT/LEFT HEART CATH AND CORONARY ANGIOGRAPHY;   Surgeon: Anner Alm ORN, MD;  Location: [facility];  Service: Cardiovascular;  Laterality: N/A;   ROTATOR CUFF REPAIR Right 02/2022   TUBAL LIGATION  1989   WRIST SURGERY  2016   Social History   Social History Narrative   Lives in [place].   Immunization History  Administered Date(s) Administered   Influenza-Unspecified 09/24/2014, 10/25/2018   Moderna Sars-Covid-2 Vaccination 02/09/2020, 03/11/2020, 11/30/2020     Objective: Vital Signs: BP 117/80 (BP Location: Left Arm, Patient Position: Sitting, Cuff Size: Large)   Pulse 71   Resp 17   Ht 5' 5 (1.651 m)   Wt 235 lb 3.2 oz (106.7 kg)   BMI 39.14 kg/m    Physical Exam Vitals and nursing note reviewed.  Constitutional:      Appearance: She is well-developed.  HENT:     Head: Normocephalic and atraumatic.  Eyes:     Conjunctiva/sclera: Conjunctivae normal.  Cardiovascular:     Rate and Rhythm: Normal rate and regular rhythm.     Heart sounds: Normal heart sounds.  Pulmonary:     Effort: Pulmonary effort is normal.     Breath sounds: Normal breath  sounds.  Abdominal:     General: Bowel sounds are normal.     Palpations: Abdomen is soft.  Musculoskeletal:     Cervical back: Normal range of motion.  Lymphadenopathy:     Cervical: No cervical adenopathy.  Skin:    General: Skin is warm and dry.     Capillary Refill: Capillary refill takes less than 2 seconds.  Neurological:     Mental Status: She is alert and oriented to person, place, and time.  Psychiatric:        Behavior: Behavior normal.      Musculoskeletal Exam: C-spine, thoracic spine, and lumbar spine good ROM.  Shoulder joints, elbow joints, wrist joints, MCPs, PIPs, and DIPs good ROM with no synovitis.  Complete fist formation bilaterally.  Hip joints have good ROM with no groin pain.  Knee joints have good range of motion with some discomfort in the left knee.  No knee joint effusion noted.  Tenderness of the right ankle with mild erythema  on the lateral aspect.  No tenderness or synovitis over MTP joints.  Tenderness along the Achilles tendon of the right foot.  CDAI Exam: CDAI Score: -- Patient Global: --; Provider Global: -- Swollen: --; Tender: -- Joint Exam 09/01/2024   No joint exam has been documented for this visit   There is currently no information documented on the homunculus. Go to the Rheumatology activity and complete the homunculus joint exam.  Investigation: No additional findings.  Imaging: No results found.  Recent Labs: Lab Results  Component Value Date   WBC 6.8 08/04/2024   HGB 12.5 08/04/2024   PLT 212 08/04/2024   NA 143 08/29/2024   K 3.9 08/29/2024   CL 106 08/29/2024   CO2 30 08/29/2024   GLUCOSE 87 08/29/2024   BUN 10 08/29/2024   CREATININE 0.61 08/29/2024   BILITOT 0.6 08/04/2024   ALKPHOS 102 12/05/2016   AST 12 08/04/2024   ALT 11 08/04/2024   PROT 6.5 08/04/2024   ALBUMIN 4.0 12/05/2016   CALCIUM 8.8 08/29/2024   GFRAA 108 09/29/2020   QFTBGOLDPLUS NEGATIVE 02/21/2023    Speciality Comments: PLQ Eye Exam: 08/05/2024 WNL @ [facility] follow up 1 year  Patient said she has an appointment in August 2025  Procedures:  No procedures performed Allergies: Aspirin    Assessment / Plan:     Visit Diagnoses: Rheumatoid arthritis with rheumatoid factor of multiple sites without organ or systems involvement (HCC) - - +RF, synovitis: She has not had any signs or symptoms of a rheumatoid arthritis flare recently.  She has not had any recurrence of tenosynovitis and has no synovitis over the wrist joints or MCP joints at this time.  She continues to have occasional discomfort in the left knee which she attributes to underlying osteoarthritis.  For the past 2 weeks she has been experiencing increased pain in the right foot and ankle.  She has established care with podiatry and had updated x-rays performed.  According to the patient she has severe spurs on the heel and is  not sure what the treatment plan will be yet.  There was discussion of possibly proceeding with an MRI since she has had new onset pain and inflammation in the right ankle.  Plan to check sed rate, CRP, and uric acid level today.  Patient has remained on methotrexate  8 tablets by mouth once weekly, folic acid  2 mg daily, and Plaquenil  200 mg 1 tablet by mouth twice daily.  She is  tolerating combination therapy without any side effects and has not had any gaps in therapy.  No medication changes will be made at this time.  She is advised to notify us  if she develops any signs or symptoms of a flare.  She will follow-up in the office in 5 months or sooner if needed.  High risk medication use - Methotrexate  8 tablets by mouth once weekly, folic acid  2 mg daily, and Plaquenil  200 mg 1 tablet by mouth twice daily. CBC and CMP updated on 08/04/24.  Her next lab work will be due in November and every 3 months to monitor for drug toxicity. PLQ Eye Exam: 08/05/2024 WNL @ [facility] follow up 1 year  No recent or recurrent infections.  Discussed the importance of holding methotrexate  if she develops signs or symptoms of an infection and to resume once the infection has completely cleared.  De Quervain's tenosynovitis, bilateral - Ultrasound-guided injections performed on 11/20/2023 alleviated her symptoms.  No recurrence.  No tenderness or inflammation noted on examination today.  Primary osteoarthritis of both hands: No tenderness or synovitis.  Complete fist formation bilaterally.  Chronic right shoulder pain: She experiences some stiffness with internal rotation.   Primary osteoarthritis of both knees: No effusion noted.  She experiences intermittent discomfort and stiffness in the left knee joint.  She underwent viscosupplementation for the left knee in March/April 2025 which was helpful at alleviating her discomfort.  She is not yet ready to proceed with reapplying for viscosupplementation for  the left knee at this time.  Primary osteoarthritis of left knee: No effusion noted.  Patient underwent viscosupplementation in March/April 2025 which provided significant relief.  She will notify us  when and if she would like to reapply for viscosupplementation in the future.  Primary osteoarthritis of both feet: She has established care with podiatry.  X-rays have been performed.  She has been using an orthotic in the right foot as well as wearing a boot.  According to the patient she has severe heel spurs which have been affecting her mobility.  Acute right ankle pain -She presents today with increased pain and inflammation in the right ankle for the past 2 weeks.  No injury or fall upon the onset of symptoms.  She currently rates the pain a 6 out of 10.  She has been icing her foot and ankle which has been helpful but the symptoms have persisted.  There is discussion of proceeding with an MRI ordered by podiatry but this has not yet been ordered.  Plan to check CRP, sed rate, and uric acid today.  plan: C-reactive protein, Sedimentation rate, Uric acid  Fibromyalgia: Discussed the importance of regular exercise and good sleep hygiene.    Other medical conditions are listed as follows:   History of COPD  History of anemia  History of vitamin D deficiency  History of sleep apnea    Orders: Orders Placed This Encounter  Procedures   C-reactive protein   Sedimentation rate   Uric acid   Meds ordered this encounter  Medications   hydroxychloroquine  (PLAQUENIL ) 200 MG tablet    Sig: TAKE 1 TABLET(200 MG) BY MOUTH TWICE DAILY    Dispense:  180 tablet    Refill:  0   methotrexate  (RHEUMATREX) 2.5 MG tablet    Sig: TAKE 8 TABLET BY MOUTH ONCE A WEEK. Caution:Chemotherapy. Protect from light.    Dispense:  96 tablet    Refill:  0     Follow-Up Instructions:  Return in about 5 months (around 02/01/2025) for Rheumatoid arthritis, Osteoarthritis.   Waddell CHRISTELLA Craze, PA-C  Note - This  record has been created using Dragon software.  Chart creation errors have been sought, but may not always  have been located. Such creation errors do not reflect on  the standard of medical care.

## 2024-08-22 ENCOUNTER — Other Ambulatory Visit: Payer: Self-pay | Admitting: *Deleted

## 2024-08-22 ENCOUNTER — Other Ambulatory Visit (HOSPITAL_BASED_OUTPATIENT_CLINIC_OR_DEPARTMENT_OTHER): Payer: Self-pay

## 2024-08-22 DIAGNOSIS — R0600 Dyspnea, unspecified: Secondary | ICD-10-CM | POA: Diagnosis not present

## 2024-08-22 DIAGNOSIS — E785 Hyperlipidemia, unspecified: Secondary | ICD-10-CM | POA: Diagnosis not present

## 2024-08-22 DIAGNOSIS — M069 Rheumatoid arthritis, unspecified: Secondary | ICD-10-CM | POA: Diagnosis not present

## 2024-08-22 DIAGNOSIS — I110 Hypertensive heart disease with heart failure: Secondary | ICD-10-CM | POA: Diagnosis not present

## 2024-08-22 DIAGNOSIS — F17210 Nicotine dependence, cigarettes, uncomplicated: Secondary | ICD-10-CM | POA: Diagnosis not present

## 2024-08-22 DIAGNOSIS — M797 Fibromyalgia: Secondary | ICD-10-CM | POA: Diagnosis not present

## 2024-08-22 DIAGNOSIS — I509 Heart failure, unspecified: Secondary | ICD-10-CM | POA: Diagnosis not present

## 2024-08-22 DIAGNOSIS — J984 Other disorders of lung: Secondary | ICD-10-CM | POA: Diagnosis not present

## 2024-08-22 DIAGNOSIS — R0602 Shortness of breath: Secondary | ICD-10-CM | POA: Diagnosis not present

## 2024-08-22 DIAGNOSIS — Z886 Allergy status to analgesic agent status: Secondary | ICD-10-CM | POA: Diagnosis not present

## 2024-08-22 DIAGNOSIS — Z79899 Other long term (current) drug therapy: Secondary | ICD-10-CM | POA: Diagnosis not present

## 2024-08-22 MED ORDER — SACUBITRIL-VALSARTAN 97-103 MG PO TABS
1.0000 | ORAL_TABLET | Freq: Two times a day (BID) | ORAL | 5 refills | Status: AC
  Filled 2024-08-22: qty 60, 30d supply, fill #0
  Filled 2024-10-10: qty 60, 30d supply, fill #1
  Filled 2024-11-23: qty 60, 30d supply, fill #2
  Filled 2024-12-29: qty 60, 30d supply, fill #3

## 2024-08-22 NOTE — Telephone Encounter (Signed)
 From: Inetta Avelina NOVAK  Sent: 08/22/2024   9:49 AM EDT   I need my entresto  prescription refilled,having problems with refills ,need samples today if posdible,I am in er of [facility] because of shallow breathing,ativan,Ativan,, Lasix  given,been.short of.breath since.having covid I July

## 2024-08-23 ENCOUNTER — Other Ambulatory Visit: Payer: Self-pay | Admitting: Nurse Practitioner

## 2024-08-27 ENCOUNTER — Ambulatory Visit: Attending: Nurse Practitioner | Admitting: Nurse Practitioner

## 2024-08-27 ENCOUNTER — Other Ambulatory Visit (HOSPITAL_BASED_OUTPATIENT_CLINIC_OR_DEPARTMENT_OTHER): Payer: Self-pay

## 2024-08-27 ENCOUNTER — Encounter: Payer: Self-pay | Admitting: Nurse Practitioner

## 2024-08-27 VITALS — BP 118/80 | HR 73 | Ht 65.0 in | Wt 233.0 lb

## 2024-08-27 DIAGNOSIS — R002 Palpitations: Secondary | ICD-10-CM | POA: Diagnosis not present

## 2024-08-27 DIAGNOSIS — I5020 Unspecified systolic (congestive) heart failure: Secondary | ICD-10-CM

## 2024-08-27 DIAGNOSIS — Z720 Tobacco use: Secondary | ICD-10-CM | POA: Diagnosis not present

## 2024-08-27 DIAGNOSIS — M1990 Unspecified osteoarthritis, unspecified site: Secondary | ICD-10-CM | POA: Diagnosis not present

## 2024-08-27 DIAGNOSIS — E782 Mixed hyperlipidemia: Secondary | ICD-10-CM

## 2024-08-27 DIAGNOSIS — I10 Essential (primary) hypertension: Secondary | ICD-10-CM

## 2024-08-27 DIAGNOSIS — M79671 Pain in right foot: Secondary | ICD-10-CM | POA: Diagnosis not present

## 2024-08-27 DIAGNOSIS — M25579 Pain in unspecified ankle and joints of unspecified foot: Secondary | ICD-10-CM | POA: Diagnosis not present

## 2024-08-27 DIAGNOSIS — I428 Other cardiomyopathies: Secondary | ICD-10-CM

## 2024-08-27 DIAGNOSIS — E669 Obesity, unspecified: Secondary | ICD-10-CM

## 2024-08-27 DIAGNOSIS — M25774 Osteophyte, right foot: Secondary | ICD-10-CM | POA: Diagnosis not present

## 2024-08-27 DIAGNOSIS — Z79899 Other long term (current) drug therapy: Secondary | ICD-10-CM | POA: Diagnosis not present

## 2024-08-27 MED ORDER — FUROSEMIDE 20 MG PO TABS
40.0000 mg | ORAL_TABLET | Freq: Every day | ORAL | 2 refills | Status: AC
  Filled 2024-08-27: qty 270, 90d supply, fill #0

## 2024-08-27 NOTE — Patient Instructions (Addendum)
 Medication Instructions:  Your physician has recommended you make the following change in your medication:  Please Decrease Lasix  to 40 Mg daily with an extra 20 Mg tablet as needed for Shortness of breath, weight gain or swelling   Labwork: Within 1 week at [facility]   Testing/Procedures: None   Follow-Up: Your physician recommends that you schedule a follow-up appointment in: 6 Weeks   Any Other Special Instructions Will Be Listed Below (If Applicable).  If you need a refill on your cardiac medications before your next appointment, please call your pharmacy.

## 2024-08-27 NOTE — Progress Notes (Unsigned)
 Cardiology Office Note:   Date: 08/27/2024 ID:  Caroline Mason, DOB Dec 22, 1956, MRN [id number] PCP:  [facility] Providers Cardiologist:  Jayson Sierras, MD    Referring MD: [facility]   CC: Here for ED follow-up  History of Present Illness:    Caroline Mason is a delightful 68 y.o. female with a PMH of chronic systolic CHF, secondary CM, HTN, OSA, COPD, RA, de Quervain's disease, tobacco abuse, anemia, fibromyalgia, mixed HLD, and diverticulosis, who presents today for ED follow-up.  In 2013, Echo revealed 45 to 50%, grade 1 DD, with diffuse hypokinesis, mild left atrial enlargement, was felt to be nonischemic as previous cath revealed normal coronary arteries.    Seen by Dr. Sierras on May 24, 2022.  She reported dyspnea with typical activities, was not fluid up. TTE 10/2022 EF to 25 to 30%, global hypokinesis of left ventricle, severely dilated left atrial size, trivial MR, otherwise no other significant valvular abnormalities. At follow-up, was discovered EF dropped d/t pt stopped taking Entresto  in the early part of summer due to not being able to afford Entresto , had restarted it shortly prior to office visit. Was tolerating all her medications well. Was smoking about 6 cigarettes/day.  Last seen on December 10, 2023. Was doing well at the time.   TTE was updated once on max titrated GDMT and revealed EF 20-25%, grade 1 DD, no significant valvular abnormalities.   03/10/2024 - She presents today for follow-up for HFrEF.  Doing well.  Admits to atypical twinges of chest pain, not bothersome per her report.  Admits to noticing some areas along bilateral medial hands of vitiligo. Denies any shortness of breath, syncope, presyncope, dizziness, orthopnea, PND, swelling or significant weight changes, acute bleeding, or claudication.  Does admit to some palpitations at times.  04/21/2024 -presents today for follow-up.  Admits to some  occasional, frequent but brief palpitations.  Says it feels like a funny feeling, lasting only for few seconds.  States that happens around 2-3 times per week, and sometimes multiple times per day.  Denies any overt sensation of fluid overload.  Does admit to use of 3 pillows at night when she is sleeping.  Says sometimes she does find herself trying to catch her breath, but denies any PND. Denies any chest pain, shortness of breath, syncope, presyncope, dizziness, PND, swelling or significant weight changes, acute bleeding, or claudication.  05/29/2024 - Here today for follow-up.  Doing well since I last saw her.  Palpitations have improved.  Says she is overall feeling very well. Denies any chest pain, shortness of breath, syncope, presyncope, dizziness, orthopnea, PND, swelling or significant weight changes, acute bleeding, or claudication.  Recent ED visit on 08/22/2024 for CHF at [facility].   Today she presents for follow-up. Tells me she is doing better since leaving the hospital. Currently finishing up Lasix  80 mg daily (was told to do this for 5-7 days after leaving the hospital). Doing well. Denies any chest pain, shortness of breath, palpitations, syncope, presyncope, dizziness, orthopnea, PND, significant weight changes, acute bleeding, or claudication. Does note some minimal swelling to lateral aspect of right ankle, otherwise doing well.   ROS:   Please see the history of present illness.    All other systems reviewed and are negative.  EKGs/Labs/Other Studies Reviewed:    The following studies were reviewed today:  EKG: EKG is not ordered today.  Right/left heart cath 04/2024:    Ost LAD to Prox  LAD lesion is 20% stenosed.   Hemodynamic findings consistent with moderate pulmonary hypertension.   There is no aortic valve stenosis.   In the absence of any other complications or medical issues, we expect the patient to be ready for discharge from a cath perspective on 04/30/2024.    Continue titrate afterload reduction.  Would recommend daily diuretic with PRN dosing.   No indication for antiplatelet therapy at this time .   Dominance: Right    Angiographically normal coronary arteries with a codominant system Moderate Pulmonary Hypertension- WHO Class II: PAP-mean 44/26-35 mmHg with a PCWP of 26 mmHg and LVEDP of 32 mmHg.  Cardiac output of index 5.44-2.55.      RECOMMENDATIONS   In the absence of any other complications or medical issues, we expect the patient to be ready for discharge from a cath perspective on 04/30/2024.   Continue titrate afterload reduction.  Would recommend daily diuretic with PRN dosing.   No indication for antiplatelet therapy at this time .    cMRI 03/2024:  IMPRESSION: 1. Moderately dilated left ventricle with LV EF 31%. Diffuse hypokinesis.   2.  Normal RV size and systolic function, RV EF 50%.   3. Mid-wall LGE in the basal-mid inferoseptal wall, basal inferior wall, and basal anterior wall. This is a non-coronary pattern of LGE, query prior myocarditis. Subendocardial LGE in the basal inferolateral wall, this may suggest an area of prior subendocardial MI.   4. Elevated extracellular volume percentage at 35%, this suggests increased myocardial fibrosis.  Cardiac monitor 03/2024:  ZIO monitor reviewed.  13 days, 7 hours analyzed.   Predominant rhythm is sinus with heart rate ranging from 55 bpm up to 112 bpm and average heart rate 73 bpm. There were rare PACs including atrial couplets and triplets representing less than 1% total beats. There were occasional PVCs representing 4.8% total beats with otherwise rare ventricular couplets and triplets as well as limited ventricular bigeminy and trigeminy. Three brief episodes of NSVT were noted, the longest of which was 4 beats. Multiple (20) brief episodes of PSVT were noted, the longest of which lasted 17 beats.  There were no sustained arrhythmias. No pauses or high degree heart  block.  Echo 02/2024:  1. Left ventricular ejection fraction, by estimation, is 20 to 25%. The  left ventricle has severely decreased function. The left ventricle  demonstrates global hypokinesis. The left ventricular internal cavity size  was mildly dilated. Left ventricular  diastolic parameters are consistent with Grade I diastolic dysfunction  (impaired relaxation). Elevated left atrial pressure. The average left  ventricular global longitudinal strain is -14.2 %. The global longitudinal  strain is abnormal.   2. Right ventricular systolic function is normal. The right ventricular  size is normal. There is normal pulmonary artery systolic pressure.   3. The mitral valve is normal in structure. No evidence of mitral valve  regurgitation. No evidence of mitral stenosis.   4. The aortic valve is tricuspid. Aortic valve regurgitation is not  visualized. No aortic stenosis is present.   5. The inferior vena cava is normal in size with greater than 50%  respiratory variability, suggesting right atrial pressure of 3 mmHg.   Comparison(s): A prior study was performed on 08/21/2023. EF 20-25%. IV  unsuccessful for use of definity.  Limited echo 07/2023: 1. Limited Echo for LVEF assessment.   2. Left ventricular ejection fraction, by estimation, is 20 to 25%. The  left ventricle has severely decreased function. The left ventricle  demonstrates global hypokinesis. The left ventricular internal cavity size  was mildly dilated. There is mild left  ventricular hypertrophy. Left ventricular diastolic parameters are  consistent with Grade I diastolic dysfunction (impaired relaxation).  Consider Limited Echo with contrast to rule out LV thrombus.   3. Right ventricular systolic function is normal. The right ventricular  size is normal.   Comparison(s): No significant change from prior study.  Echocardiogram on November 23, 2022:  1. Left ventricular ejection fraction, by estimation, is 25 to  30%. The  left ventricle has severely decreased function. The left ventricle  demonstrates global hypokinesis. Left ventricular diastolic parameters are  indeterminate. Elevated left atrial  pressure. The average left ventricular global longitudinal strain is -14.0  %. The global longitudinal strain is abnormal.   2. Right ventricular systolic function is normal. The right ventricular  size is normal.   3. Left atrial size was severely dilated.   4. The mitral valve is normal in structure. Trivial mitral valve  regurgitation. No evidence of mitral stenosis.   5. The tricuspid valve is abnormal.   6. The aortic valve has an indeterminant number of cusps. Aortic valve  regurgitation is not visualized. No aortic stenosis is present.   7. The inferior vena cava is normal in size with greater than 50%  respiratory variability, suggesting right atrial pressure of 3 mmHg.   Comparison(s): Echocardiogram done 09/08/21 showed an EF of 45-50%.  Physical Exam:    VS:  BP 118/80   Pulse 73   Ht 5' 5 (1.651 m)   Wt 233 lb (105.7 kg)   SpO2 97%   BMI 38.77 kg/m     Wt Readings from Last 3 Encounters:  08/27/24 233 lb (105.7 kg)  05/29/24 230 lb (104.3 kg)  04/30/24 232 lb (105.2 kg)    GEN: Obese, 68 y.o. female in no acute distress HEENT: Normal NECK: No JVD; No carotid bruits CARDIAC: S1/S2, RRR, no murmurs, rubs, gallops; 2+ peripheral pulses throughout, strong and equal bilaterally RESPIRATORY:  Clear to auscultation without rales, wheezing or rhonchi  MUSCULOSKELETAL:  No edema to BLE; No deformity SKIN: Warm and dry NEUROLOGIC:  Alert and oriented x 3 PSYCHIATRIC:  Normal affect   ASSESSMENT & PLAN:     HFrEF, NICM, medication management Stage C, NYHA class I symptoms.  Limited TTE 07/2023 revealed EF 20 to 25%, repeat Echo 02/2024 revealed EF 20 to 25%.  NICM. Euvolemic and well compensated on exam and just completed increase dose of Lasix , instructed her to decrease Lasix  to 40  mg daily and may take extra 20 mg daily tablet PRN for shortness of breath, weight gain, or swelling. Will obtain BMET in 1 week. See cardiac MRI results noted above.  Continue rest of medication regimen.  Low sodium diet, fluid restriction <2L, and daily weights encouraged. Educated to contact our office for weight gain of 2 lbs overnight or 5 lbs in one week. Heart healthy diet and regular cardiovascular exercise encouraged. If EF does not improve with next Echo, consider EP referral in future.   3.  Palpitations Denies any recent palpitations. No medication changes at this time.   Heart healthy diet recommended.  4. HTN BP today stable. Continue current medication regimen. Discussed to monitor BP at home at least 2 hours after medications and sitting for 5-10 minutes. Heart healthy diet and regular cardiovascular exercise encouraged.   5. Obesity Weight loss via diet and exercise encouraged. Discussed the impact being overweight would have  on cardiovascular risk.  6. Tobacco abuse Smoking cessation encouraged and discussed.    7. Mixed HLD Most recent LDL 52.  She is currently at goal.  No medication changes at this time.  Heart healthy diet and regular cardiovascular exercise encouraged.    Disposition: Follow-up with Dr. Debera or APP in 6 weeks or sooner if anything changes.   Medication Adjustments/Labs and Tests Ordered: Current medicines are reviewed at length with the patient today.  Concerns regarding medicines are outlined above.  No orders of the defined types were placed in this encounter.  No orders of the defined types were placed in this encounter.   Patient Instructions  Medication Instructions:    Labwork:   Testing/Procedures:   Follow-Up: Your physician recommends that you schedule a follow-up appointment in:   Any Other Special Instructions Will Be Listed Below (If Applicable).  If you need a refill on your cardiac medications before your next  appointment, please call your pharmacy.   Signed, Caroline Crate, NP

## 2024-08-29 DIAGNOSIS — Z79899 Other long term (current) drug therapy: Secondary | ICD-10-CM | POA: Diagnosis not present

## 2024-08-29 LAB — BASIC METABOLIC PANEL WITH GFR
BUN: 10 mg/dL (ref 7–25)
CO2: 30 mmol/L (ref 20–32)
Calcium: 8.8 mg/dL (ref 8.6–10.4)
Chloride: 106 mmol/L (ref 98–110)
Creat: 0.61 mg/dL (ref 0.50–1.05)
Glucose, Bld: 87 mg/dL (ref 65–99)
Potassium: 3.9 mmol/L (ref 3.5–5.3)
Sodium: 143 mmol/L (ref 135–146)
eGFR: 99 mL/min/1.73m2 (ref >=60)

## 2024-09-01 ENCOUNTER — Ambulatory Visit: Attending: Physician Assistant | Admitting: Physician Assistant

## 2024-09-01 ENCOUNTER — Encounter: Payer: Self-pay | Admitting: Physician Assistant

## 2024-09-01 VITALS — BP 117/80 | HR 71 | Resp 17 | Ht 65.0 in | Wt 235.2 lb

## 2024-09-01 DIAGNOSIS — M25511 Pain in right shoulder: Secondary | ICD-10-CM

## 2024-09-01 DIAGNOSIS — Z8709 Personal history of other diseases of the respiratory system: Secondary | ICD-10-CM | POA: Diagnosis not present

## 2024-09-01 DIAGNOSIS — Z79899 Other long term (current) drug therapy: Secondary | ICD-10-CM

## 2024-09-01 DIAGNOSIS — M19071 Primary osteoarthritis, right ankle and foot: Secondary | ICD-10-CM

## 2024-09-01 DIAGNOSIS — M1712 Unilateral primary osteoarthritis, left knee: Secondary | ICD-10-CM | POA: Diagnosis not present

## 2024-09-01 DIAGNOSIS — M19041 Primary osteoarthritis, right hand: Secondary | ICD-10-CM | POA: Diagnosis not present

## 2024-09-01 DIAGNOSIS — M19072 Primary osteoarthritis, left ankle and foot: Secondary | ICD-10-CM

## 2024-09-01 DIAGNOSIS — M19042 Primary osteoarthritis, left hand: Secondary | ICD-10-CM

## 2024-09-01 DIAGNOSIS — Z8639 Personal history of other endocrine, nutritional and metabolic disease: Secondary | ICD-10-CM

## 2024-09-01 DIAGNOSIS — M654 Radial styloid tenosynovitis [de Quervain]: Secondary | ICD-10-CM

## 2024-09-01 DIAGNOSIS — M25571 Pain in right ankle and joints of right foot: Secondary | ICD-10-CM | POA: Diagnosis not present

## 2024-09-01 DIAGNOSIS — G8929 Other chronic pain: Secondary | ICD-10-CM

## 2024-09-01 DIAGNOSIS — M0579 Rheumatoid arthritis with rheumatoid factor of multiple sites without organ or systems involvement: Secondary | ICD-10-CM

## 2024-09-01 DIAGNOSIS — Z862 Personal history of diseases of the blood and blood-forming organs and certain disorders involving the immune mechanism: Secondary | ICD-10-CM | POA: Diagnosis not present

## 2024-09-01 DIAGNOSIS — M797 Fibromyalgia: Secondary | ICD-10-CM

## 2024-09-01 DIAGNOSIS — M170 Bilateral primary osteoarthritis of knee: Secondary | ICD-10-CM | POA: Diagnosis not present

## 2024-09-01 DIAGNOSIS — Z8669 Personal history of other diseases of the nervous system and sense organs: Secondary | ICD-10-CM

## 2024-09-01 MED ORDER — METHOTREXATE SODIUM 2.5 MG PO TABS: ORAL_TABLET | ORAL | 0 refills | Status: DC

## 2024-09-01 MED ORDER — HYDROXYCHLOROQUINE SULFATE 200 MG PO TABS: ORAL_TABLET | ORAL | 0 refills | Status: AC

## 2024-09-01 NOTE — Patient Instructions (Signed)
 Standing Labs We placed an order today for your standing lab work.   Please have your standing labs drawn in November and every 3 months   Please have your labs drawn 2 weeks prior to your appointment so that the provider can discuss your lab results at your appointment, if possible.  Please note that you may see your imaging and lab results in MyChart before we have reviewed them. We will contact you once all results are reviewed. Please allow our office up to 72 hours to thoroughly review all of the results before contacting the office for clarification of your results.  WALK-IN LAB HOURS  Monday through Thursday from 8:00 am -12:30 pm and 1:00 pm-4:30 pm and Friday from 8:00 am-12:00 pm.  Patients with office visits requiring labs will be seen before walk-in labs.  You may encounter longer than normal wait times. Please allow additional time. Wait times may be shorter on  Monday and Thursday afternoons.  We do not book appointments for walk-in labs. We appreciate your patience and understanding with our staff.   Labs are drawn by [facility]. Please bring your co-pay at the time of your lab draw.  You may receive a bill from [facility] for your lab work.  Please note if you are on Hydroxychloroquine  and and an order has been placed for a Hydroxychloroquine  level,  you will need to have it drawn 4 hours or more after your last dose.  If you wish to have your labs drawn at another location, please call the office 24 hours in advance so we can fax the orders.  The office is located at [address], [place], [place] [address]   If you have any questions regarding directions or hours of operation,  please call [phone].   As a reminder, please drink plenty of water prior to coming for your lab work. Thanks!

## 2024-09-02 ENCOUNTER — Ambulatory Visit: Payer: Self-pay | Admitting: Physician Assistant

## 2024-09-02 ENCOUNTER — Other Ambulatory Visit (HOSPITAL_BASED_OUTPATIENT_CLINIC_OR_DEPARTMENT_OTHER): Payer: Self-pay

## 2024-09-02 ENCOUNTER — Encounter (HOSPITAL_BASED_OUTPATIENT_CLINIC_OR_DEPARTMENT_OTHER): Payer: Self-pay

## 2024-09-02 DIAGNOSIS — G8929 Other chronic pain: Secondary | ICD-10-CM | POA: Insufficient documentation

## 2024-09-02 LAB — SEDIMENTATION RATE: Sed Rate: 22 mm/h (ref 0–30)

## 2024-09-02 LAB — URIC ACID: Uric Acid, Serum: 5.1 mg/dL (ref 2.5–7.0)

## 2024-09-02 LAB — C-REACTIVE PROTEIN: CRP: 6.4 mg/L (ref <8.0)

## 2024-09-02 NOTE — Progress Notes (Signed)
 Uric acid has improved-6.4.   ESR and CRP WNL

## 2024-09-04 ENCOUNTER — Ambulatory Visit: Payer: Self-pay | Admitting: Nurse Practitioner

## 2024-09-10 DIAGNOSIS — M79671 Pain in right foot: Secondary | ICD-10-CM | POA: Diagnosis not present

## 2024-09-10 DIAGNOSIS — M7661 Achilles tendinitis, right leg: Secondary | ICD-10-CM | POA: Diagnosis not present

## 2024-09-22 ENCOUNTER — Ambulatory Visit: Admitting: Nurse Practitioner

## 2024-09-25 ENCOUNTER — Ambulatory Visit (INDEPENDENT_AMBULATORY_CARE_PROVIDER_SITE_OTHER): Payer: Medicare Other | Admitting: Gastroenterology

## 2024-09-25 ENCOUNTER — Encounter (INDEPENDENT_AMBULATORY_CARE_PROVIDER_SITE_OTHER): Payer: Self-pay | Admitting: Gastroenterology

## 2024-09-25 VITALS — BP 107/65 | HR 73 | Temp 97.0°F | Ht 65.0 in | Wt 236.2 lb

## 2024-09-25 DIAGNOSIS — Z91018 Allergy to other foods: Secondary | ICD-10-CM

## 2024-09-25 DIAGNOSIS — K580 Irritable bowel syndrome with diarrhea: Secondary | ICD-10-CM | POA: Diagnosis not present

## 2024-09-25 DIAGNOSIS — Z860101 Personal history of adenomatous and serrated colon polyps: Secondary | ICD-10-CM

## 2024-09-25 DIAGNOSIS — R195 Other fecal abnormalities: Secondary | ICD-10-CM

## 2024-09-25 MED ORDER — RIFAXIMIN 550 MG PO TABS: 550.0000 mg | ORAL_TABLET | Freq: Three times a day (TID) | ORAL | 0 refills | Status: AC

## 2024-09-25 MED ORDER — DICYCLOMINE HCL 10 MG PO CAPS: 10.0000 mg | ORAL_CAPSULE | Freq: Two times a day (BID) | ORAL | 1 refills | Status: AC | PRN

## 2024-09-25 NOTE — Patient Instructions (Signed)
 Continue bentyl  twice daily as needed for abdominal pain Continue to avoid beef given history of allergy present for this in the past I have sent xifaxan  to take three times per day for IBS flare, this may take a few days to go through with your insurance I am also providing the low FODMAP diet which can be helpful for managing IBS  Follow up 3 months  It was a pleasure to see you today. I want to create trusting relationships with patients and provide genuine, compassionate, and quality care. I truly value your feedback! please be on the lookout for a survey regarding your visit with me today. I appreciate your input about our visit and your time in completing this!    Daisia Slomski L. Ritesh Opara, MSN, APRN, AGNP-C Adult-Gerontology Nurse Practitioner [facility]

## 2024-09-25 NOTE — Progress Notes (Addendum)
 Referring Provider: [facility] Primary Care Physician:  [facility] Primary GI Physician: Dr. Eartha   Chief Complaint  Patient presents with   Follow-up    Pt arrives for follow up. Pt states she is having a flare up of IBS. Having loose bowels. Going on about 3 weeks.    HPI:   Caroline Mason is a 68 y.o. female with past medical history of nonischemic cardiomyopathy, FBM, obesity, depression, RA, COPD,  HTN   Patient presenting today for:  Follow up of diarrhea and abdominal pain likely secondary to alpha gal/IBS  Alpha gal testing in feb 2024 with elevated IgE to beef/galactose alpha/IgG elevated  Last seen October 2024, at that time avoiding alpha gal containing foods, notes more diarrhea and pain if she does eat these. Sometimes dairy causes her issues too. Taking bentyl  10mg  BID  Recommended to continue to avoid alpha gal containing foods, continue bentyl  BID PRN  Most recent labs done in August with CBC, CMP grossly unremarkable, TSH 2.508  Present:  Patient states over the past 3 weeks she has had more abdominal pain and looser stools. No watery stools. She notes she is having 6+ BMs per day. Has some abdominal discomfort but is improved with bentyl . No rectal bleeding or melena. Denies any recent new meds or antibiotics. She denies nausea or vomiting. She does report more stress recently with some issues with her feet. Has not used any imodium but sometimes takes pepto bismol which seems to help some.   CT angiogram 07/24/23 does not support chronic mesenteric ischemia as a source of abdominal pain, given the relative paucity of aortic and mesenteric atherosclerosis. Aortic Atherosclerosis Small right-sided low-density renal lesions too small to characterize though most likely benign. Last EGD: Never Last Colonoscopy:07/2022 5 polyps cecum, ascending colon and transverse between 2 to 6 mm in size, 2 polyps between 3 to 5 mm in the  descending colon, all polyps removed (five tubular adenomas and 3 SSLs), diverticulosis.   repeat colonoscopy in 3 years   Past Medical History:  Diagnosis Date   Atypical pneumonia    12/11   Cardiomyopathy    Nonischemic, normal coronaries, LVEF 40-45% >> LVEF 25-30% 10/2022   COPD (chronic obstructive pulmonary disease) (HCC)    Depression    Diverticulitis    per patient   Diverticulosis    Essential hypertension    Fibromyalgia    Heel spur    bilateral per patient   Morbid obesity (HCC)    Obstructive sleep apnea    Rheumatoid arthritis(714.0)     Past Surgical History:  Procedure Laterality Date   COLONOSCOPY WITH PROPOFOL  N/A 07/25/2022   Procedure: COLONOSCOPY WITH PROPOFOL ;  Surgeon: Eartha Angelia Sieving, MD;  Location: [facility];  Service: Gastroenterology;  Laterality: N/A;  815   CYST REMOVAL TRUNK     POLYPECTOMY  07/25/2022   Procedure: POLYPECTOMY;  Surgeon: Eartha Angelia Sieving, MD;  Location: [facility];  Service: Gastroenterology;;  cecal and ascending;descending   RIGHT/LEFT HEART CATH AND CORONARY ANGIOGRAPHY N/A 04/30/2024   Procedure: RIGHT/LEFT HEART CATH AND CORONARY ANGIOGRAPHY;  Surgeon: Anner Alm ORN, MD;  Location[facility] INVASIVE CV LAB;  Service: Cardiovascular;  Laterality: N/A;   ROTATOR CUFF REPAIR Right 02/2022   TUBAL LIGATION  1989   WRIST SURGERY  2016    Current Outpatient Medications  Medication Sig Dispense Refill   atorvastatin (LIPITOR) 10 MG tablet Take 10 mg by mouth daily.  carvedilol  (COREG ) 6.25 MG tablet Take 1 tablet (6.25 mg total) by mouth 2 (two) times daily with a meal. 180 tablet 1   Cholecalciferol (VITAMIN D3 ADULT GUMMIES PO) Take 1 tablet by mouth daily.     dapagliflozin  propanediol (FARXIGA ) 10 MG TABS tablet TAKE 1 TABLET(10 MG) BY MOUTH DAILY BEFORE BREAKFAST 30 tablet 11   dicyclomine  (BENTYL ) 10 MG capsule TAKE 1 CAPSULE(10 MG) BY MOUTH EVERY 12 HOURS AS NEEDED FOR ABDOMINAL PAIN 180 capsule  0   docusate sodium (COLACE) 100 MG capsule Take 100 mg by mouth as needed.     folic acid  (FOLVITE ) 1 MG tablet Take 2 tablets (2 mg total) by mouth daily. 180 tablet 3   furosemide  (LASIX ) 20 MG tablet Take 2 tablets (40 mg total) by mouth daily. With an extra 20 Mg tablet as needed for Shortness of breath, weight gain, or swelling 270 tablet 2   gabapentin (NEURONTIN) 300 MG capsule Take 300 mg by mouth 2 (two) times daily.     hydroxychloroquine  (PLAQUENIL ) 200 MG tablet TAKE 1 TABLET(200 MG) BY MOUTH TWICE DAILY 180 tablet 0   methotrexate  (RHEUMATREX) 2.5 MG tablet TAKE 8 TABLET BY MOUTH ONCE A WEEK. Caution:Chemotherapy. Protect from light. 96 tablet 0   sacubitril -valsartan  (ENTRESTO ) 97-103 MG Take 1 tablet by mouth 2 (two) times daily. 60 tablet 5   sertraline (ZOLOFT) 100 MG tablet Take 100 mg by mouth daily.     spironolactone  (ALDACTONE ) 25 MG tablet TAKE 1 TABLET(25 MG) BY MOUTH DAILY 90 tablet 0   No current facility-administered medications for this visit.    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    Comment: [employer]  Tobacco Use   Smoking status: Some Days    Current packs/day: 0.25    Average packs/day: 0.3 packs/day for 48.3 years (12.1 ttl pk-yrs)    Types: Cigarettes    Start date: 06/03/1976    Passive exposure: Past   Smokeless tobacco: Never   Tobacco comments:    smokes 3-4 per day again  Vaping Use   Vaping status: Never Used  Substance and Sexual Activity   Alcohol use: No    Alcohol/week: 0.0 standard drinks of alcohol   Drug use: No   Sexual activity: Yes    Birth control/protection: None  Other Topics Concern   Not on  file  Social History Narrative   Lives in [place].   Social Drivers of Corporate investment banker Strain: Not on file  Food Insecurity: Not on file  Transportation Needs: Not on file  Physical Activity: Not on file  Stress: Not on file  Social Connections: Not on file    Review of systems General: negative for malaise, night sweats, fever, chills, weight loss Neck: Negative for lumps, goiter, pain and significant neck swelling Resp: Negative for cough, wheezing, dyspnea at rest CV: Negative for chest pain, leg swelling, palpitations, orthopnea GI: denies melena, hematochezia, nausea, vomiting, diarrhea, constipation, dysphagia, odyonophagia, early satiety or unintentional weight loss. +loose stools +abdominal discomfort  MSK: Negative for joint pain or swelling, back pain, and muscle pain. Derm: Negative  for itching or rash Psych: Denies depression, anxiety, memory loss, confusion. No homicidal or suicidal ideation.  Heme: Negative for prolonged bleeding, bruising easily, and swollen nodes. Endocrine: Negative for cold or heat intolerance, polyuria, polydipsia and goiter. Neuro: negative for tremor, gait imbalance, syncope and seizures. The remainder of the review of systems is noncontributory.  Physical Exam: There were no vitals taken for this visit. General:   Alert and oriented. No distress noted. Pleasant and cooperative.  Head:  Normocephalic and atraumatic. Eyes:  Conjuctiva clear without scleral icterus. Mouth:  Oral mucosa pink and moist. Good dentition. No lesions. Heart: Normal rate and rhythm, s1 and s2 heart sounds present.  Lungs: Clear lung sounds in all lobes. Respirations equal and unlabored. Abdomen:  +BS, soft, non-tender and non-distended. No rebound or guarding. No HSM or masses noted. Derm: No palmar erythema or jaundice Msk:  Symmetrical without gross deformities. Normal posture. Extremities:  Without edema. Neurologic:  Alert and  oriented x4 Psych:   Alert and cooperative. Normal mood and affect.  Invalid input(s): 6 MONTHS   ASSESSMENT: Caroline Mason is a 68 y.o. female presenting today for follow up of looser stools, with history of allergy to beef/alpha gal and IBS  Patient with known beef/alpha gal allergy. She notes beef seems to flare up her symptoms and she has been consuming some recently. Endorses more looser stools and abdominal discomfort. No alarm symptoms. Taking bentyl  BID with improvement in abdominal pain. Suspect symptoms could be multifactorial, due to consumption of beef and/or possible IBS flare. She does note more stress lately. Denies any recent antibiotic exposure. Will continue with bentyl  PRN, needs to follow alpha gal diet. I will provide low FODMAP guide as this may also be helpful and will try course of xifaxan  given possible IBS flare.    PLAN:  -continue to avoid Beef/follow alpha gal diet  -xifaxan  550mg  TID x14 days  -continue bentyl  10mg  BID PRN -low FODMAP  All questions were answered, patient verbalized understanding and is in agreement with plan as outlined above.    Follow Up: 3 months   Mckinsey Keagle L. Mariette, MSN, APRN, AGNP-C Adult-Gerontology Nurse Practitioner [facility]  I have reviewed the note and agree with the APP's assessment as described in this progress note  Toribio Fortune, MD Gastroenterology and Hepatology [facility]

## 2024-09-26 ENCOUNTER — Telehealth (INDEPENDENT_AMBULATORY_CARE_PROVIDER_SITE_OTHER): Payer: Self-pay

## 2024-09-26 NOTE — Telephone Encounter (Signed)
 To: Mitzie Boettcher From: [facility] Phone: [phone] Phone: [phone] Fax: [phone] Reference #: [id number] RE: Prior Authorization Request Patient Name: Caroline Mason Patient DOB: 08-22-1956 Patient ID: [id number] Status of Request: Approve Medication Name: Xifaxan  Tab 550mg  GPI/NDC: [id number] Decision Notes: XIFAXAN  TAB 550MG , use as directed, is approved through 10/10/2024 under your Medicare Part D benefit. If the treating physician would like to discuss this coverage decision with the physician or health care professional reviewer, please call [facility] Prior Authorization department at [phone].

## 2024-09-29 DIAGNOSIS — M7751 Other enthesopathy of right foot: Secondary | ICD-10-CM | POA: Diagnosis not present

## 2024-09-29 DIAGNOSIS — M948X7 Other specified disorders of cartilage, ankle and foot: Secondary | ICD-10-CM | POA: Diagnosis not present

## 2024-09-29 DIAGNOSIS — M25474 Effusion, right foot: Secondary | ICD-10-CM | POA: Diagnosis not present

## 2024-09-29 DIAGNOSIS — M7661 Achilles tendinitis, right leg: Secondary | ICD-10-CM | POA: Diagnosis not present

## 2024-09-29 DIAGNOSIS — M25471 Effusion, right ankle: Secondary | ICD-10-CM | POA: Diagnosis not present

## 2024-09-29 DIAGNOSIS — M19071 Primary osteoarthritis, right ankle and foot: Secondary | ICD-10-CM | POA: Diagnosis not present

## 2024-10-08 ENCOUNTER — Encounter (INDEPENDENT_AMBULATORY_CARE_PROVIDER_SITE_OTHER): Payer: Self-pay | Admitting: Gastroenterology

## 2024-10-09 DIAGNOSIS — E782 Mixed hyperlipidemia: Secondary | ICD-10-CM | POA: Diagnosis not present

## 2024-10-09 DIAGNOSIS — M1991 Primary osteoarthritis, unspecified site: Secondary | ICD-10-CM | POA: Diagnosis not present

## 2024-10-09 DIAGNOSIS — F17210 Nicotine dependence, cigarettes, uncomplicated: Secondary | ICD-10-CM | POA: Diagnosis not present

## 2024-10-09 DIAGNOSIS — D126 Benign neoplasm of colon, unspecified: Secondary | ICD-10-CM | POA: Diagnosis not present

## 2024-10-09 DIAGNOSIS — K580 Irritable bowel syndrome with diarrhea: Secondary | ICD-10-CM | POA: Diagnosis not present

## 2024-10-09 DIAGNOSIS — I5042 Chronic combined systolic (congestive) and diastolic (congestive) heart failure: Secondary | ICD-10-CM | POA: Diagnosis not present

## 2024-10-09 DIAGNOSIS — Z7189 Other specified counseling: Secondary | ICD-10-CM | POA: Diagnosis not present

## 2024-10-09 DIAGNOSIS — M0579 Rheumatoid arthritis with rheumatoid factor of multiple sites without organ or systems involvement: Secondary | ICD-10-CM | POA: Diagnosis not present

## 2024-10-09 DIAGNOSIS — E559 Vitamin D deficiency, unspecified: Secondary | ICD-10-CM | POA: Diagnosis not present

## 2024-10-09 DIAGNOSIS — I10 Essential (primary) hypertension: Secondary | ICD-10-CM | POA: Diagnosis not present

## 2024-10-09 DIAGNOSIS — I471 Supraventricular tachycardia, unspecified: Secondary | ICD-10-CM | POA: Diagnosis not present

## 2024-10-13 ENCOUNTER — Other Ambulatory Visit (HOSPITAL_BASED_OUTPATIENT_CLINIC_OR_DEPARTMENT_OTHER): Payer: Self-pay

## 2024-10-13 DIAGNOSIS — M7661 Achilles tendinitis, right leg: Secondary | ICD-10-CM | POA: Diagnosis not present

## 2024-10-13 DIAGNOSIS — M79671 Pain in right foot: Secondary | ICD-10-CM | POA: Diagnosis not present

## 2024-10-13 MED ORDER — PREDNISONE 10 MG PO TABS
10.0000 mg | ORAL_TABLET | Freq: Three times a day (TID) | ORAL | 0 refills | Status: AC
  Filled 2024-10-13: qty 21, 7d supply, fill #0

## 2024-11-06 ENCOUNTER — Encounter: Payer: Self-pay | Admitting: Nurse Practitioner

## 2024-11-06 ENCOUNTER — Ambulatory Visit: Attending: Nurse Practitioner | Admitting: Nurse Practitioner

## 2024-11-06 VITALS — BP 116/72 | HR 81 | Ht 65.0 in | Wt 235.2 lb

## 2024-11-06 DIAGNOSIS — I428 Other cardiomyopathies: Secondary | ICD-10-CM

## 2024-11-06 DIAGNOSIS — I10 Essential (primary) hypertension: Secondary | ICD-10-CM | POA: Diagnosis not present

## 2024-11-06 DIAGNOSIS — R0989 Other specified symptoms and signs involving the circulatory and respiratory systems: Secondary | ICD-10-CM

## 2024-11-06 DIAGNOSIS — I5020 Unspecified systolic (congestive) heart failure: Secondary | ICD-10-CM | POA: Diagnosis not present

## 2024-11-06 DIAGNOSIS — E669 Obesity, unspecified: Secondary | ICD-10-CM

## 2024-11-06 DIAGNOSIS — E782 Mixed hyperlipidemia: Secondary | ICD-10-CM

## 2024-11-06 DIAGNOSIS — Z720 Tobacco use: Secondary | ICD-10-CM

## 2024-11-06 NOTE — Patient Instructions (Signed)
 Medication Instructions:  Your physician recommends that you continue on your current medications as directed. Please refer to the Current Medication list given to you today.   Labwork: None  Testing/Procedures: Your physician has requested that you have a carotid duplex. This test is an ultrasound of the carotid arteries in your neck. It looks at blood flow through these arteries that supply the brain with blood. Allow one hour for this exam. There are no restrictions or special instructions.  Your physician has requested that you have an echocardiogram in 3 months. Echocardiography is a painless test that uses sound waves to create images of your heart. It provides your doctor with information about the size and shape of your heart and how well your heart's chambers and valves are working. This procedure takes approximately one hour. There are no restrictions for this procedure. Please do NOT wear cologne, perfume, aftershave, or lotions (deodorant is allowed). Please arrive 15 minutes prior to your appointment time.  Please note: We ask at that you not bring children with you during ultrasound (echo/ vascular) testing. Due to room size and safety concerns, children are not allowed in the ultrasound rooms during exams. Our front office staff cannot provide observation of children in our lobby area while testing is being conducted. An adult accompanying a patient to their appointment will only be allowed in the ultrasound room at the discretion of the ultrasound technician under special circumstances. We apologize for any inconvenience.   Follow-Up: Your physician recommends that you schedule a follow-up appointment in: 4 months  Any Other Special Instructions Will Be Listed Below (If Applicable). Thank you for choosing [facility]!     If you need a refill on your cardiac medications before your next appointment, please call your pharmacy.

## 2024-11-06 NOTE — Progress Notes (Signed)
 Cardiology Office Note:   Date: 11/06/2024 ID:  ARANDA BIHM, DOB 23-Apr-1956, MRN [id number] PCP:  [facility] Providers Cardiologist:  Jayson Sierras, MD    Referring MD: [facility]   CC: Here for follow-up  History of Present Illness:    ARMYA Mason is a delightful 68 y.o. female with a PMH of chronic systolic CHF, secondary CM, HTN, OSA, COPD, RA, de Quervain's disease, tobacco abuse, anemia, fibromyalgia, mixed HLD, and diverticulosis, who presents today for ED follow-up.  In 2013, Echo revealed 45 to 50%, grade 1 DD, with diffuse hypokinesis, mild left atrial enlargement, was felt to be nonischemic as previous cath revealed normal coronary arteries.    Seen by Dr. Sierras on May 24, 2022.  She reported dyspnea with typical activities, was not fluid up. TTE 10/2022 EF to 25 to 30%, global hypokinesis of left ventricle, severely dilated left atrial size, trivial MR, otherwise no other significant valvular abnormalities. At follow-up, was discovered EF dropped d/t pt stopped taking Entresto  in the early part of summer due to not being able to afford Entresto , had restarted it shortly prior to office visit. Was tolerating all her medications well. Was smoking about 6 cigarettes/day.  Last seen on December 10, 2023. Was doing well at the time.   TTE was updated once on max titrated GDMT and revealed EF 20-25%, grade 1 DD, no significant valvular abnormalities.   03/10/2024 - She presents today for follow-up for HFrEF.  Doing well.  Admits to atypical twinges of chest pain, not bothersome per her report.  Admits to noticing some areas along bilateral medial hands of vitiligo. Denies any shortness of breath, syncope, presyncope, dizziness, orthopnea, PND, swelling or significant weight changes, acute bleeding, or claudication.  Does admit to some palpitations at times.  04/21/2024 -presents today for follow-up.  Admits to some  occasional, frequent but brief palpitations.  Says it feels like a funny feeling, lasting only for few seconds.  States that happens around 2-3 times per week, and sometimes multiple times per day.  Denies any overt sensation of fluid overload.  Does admit to use of 3 pillows at night when she is sleeping.  Says sometimes she does find herself trying to catch her breath, but denies any PND. Denies any chest pain, shortness of breath, syncope, presyncope, dizziness, PND, swelling or significant weight changes, acute bleeding, or claudication.  05/29/2024 - Here today for follow-up.  Doing well since I last saw her.  Palpitations have improved.  Says she is overall feeling very well. Denies any chest pain, shortness of breath, syncope, presyncope, dizziness, orthopnea, PND, swelling or significant weight changes, acute bleeding, or claudication.  Recent ED visit on 08/22/2024 for CHF at [facility].   08/27/2024 - Today she presents for follow-up. Tells me she is doing better since leaving the hospital. Currently finishing up Lasix  80 mg daily (was told to do this for 5-7 days after leaving the hospital). Doing well. Denies any chest pain, shortness of breath, palpitations, syncope, presyncope, dizziness, orthopnea, PND, significant weight changes, acute bleeding, or claudication. Does note some minimal swelling to lateral aspect of right ankle, otherwise doing well.   11/06/2024 -here for follow-up and doing well from a cardiac standpoint.  Admits to torn tendon along her right foot, otherwise doing well. Denies any chest pain, shortness of breath, palpitations, syncope, presyncope, dizziness, orthopnea, PND, swelling or significant weight changes, acute bleeding, or claudication.  ROS:   Please see  the history of present illness.    All other systems reviewed and are negative.  EKGs/Labs/Other Studies Reviewed:    The following studies were reviewed today:  EKG: EKG is not ordered  today.  Right/left heart cath 04/2024:    Ost LAD to Prox LAD lesion is 20% stenosed.   Hemodynamic findings consistent with moderate pulmonary hypertension.   There is no aortic valve stenosis.   In the absence of any other complications or medical issues, we expect the patient to be ready for discharge from a cath perspective on 04/30/2024.   Continue titrate afterload reduction.  Would recommend daily diuretic with PRN dosing.   No indication for antiplatelet therapy at this time .   Dominance: Right    Angiographically normal coronary arteries with a codominant system Moderate Pulmonary Hypertension- WHO Class II: PAP-mean 44/26-35 mmHg with a PCWP of 26 mmHg and LVEDP of 32 mmHg.  Cardiac output of index 5.44-2.55.      RECOMMENDATIONS   In the absence of any other complications or medical issues, we expect the patient to be ready for discharge from a cath perspective on 04/30/2024.   Continue titrate afterload reduction.  Would recommend daily diuretic with PRN dosing.   No indication for antiplatelet therapy at this time .    cMRI 03/2024:  IMPRESSION: 1. Moderately dilated left ventricle with LV EF 31%. Diffuse hypokinesis.   2.  Normal RV size and systolic function, RV EF 50%.   3. Mid-wall LGE in the basal-mid inferoseptal wall, basal inferior wall, and basal anterior wall. This is a non-coronary pattern of LGE, query prior myocarditis. Subendocardial LGE in the basal inferolateral wall, this may suggest an area of prior subendocardial MI.   4. Elevated extracellular volume percentage at 35%, this suggests increased myocardial fibrosis.  Cardiac monitor 03/2024:  ZIO monitor reviewed.  13 days, 7 hours analyzed.   Predominant rhythm is sinus with heart rate ranging from 55 bpm up to 112 bpm and average heart rate 73 bpm. There were rare PACs including atrial couplets and triplets representing less than 1% total beats. There were occasional PVCs representing 4.8% total  beats with otherwise rare ventricular couplets and triplets as well as limited ventricular bigeminy and trigeminy. Three brief episodes of NSVT were noted, the longest of which was 4 beats. Multiple (20) brief episodes of PSVT were noted, the longest of which lasted 17 beats.  There were no sustained arrhythmias. No pauses or high degree heart block.  Echo 02/2024:  1. Left ventricular ejection fraction, by estimation, is 20 to 25%. The  left ventricle has severely decreased function. The left ventricle  demonstrates global hypokinesis. The left ventricular internal cavity size  was mildly dilated. Left ventricular  diastolic parameters are consistent with Grade I diastolic dysfunction  (impaired relaxation). Elevated left atrial pressure. The average left  ventricular global longitudinal strain is -14.2 %. The global longitudinal  strain is abnormal.   2. Right ventricular systolic function is normal. The right ventricular  size is normal. There is normal pulmonary artery systolic pressure.   3. The mitral valve is normal in structure. No evidence of mitral valve  regurgitation. No evidence of mitral stenosis.   4. The aortic valve is tricuspid. Aortic valve regurgitation is not  visualized. No aortic stenosis is present.   5. The inferior vena cava is normal in size with greater than 50%  respiratory variability, suggesting right atrial pressure of 3 mmHg.   Comparison(s): A prior study  was performed on 08/21/2023. EF 20-25%. IV  unsuccessful for use of definity.  Limited echo 07/2023: 1. Limited Echo for LVEF assessment.   2. Left ventricular ejection fraction, by estimation, is 20 to 25%. The  left ventricle has severely decreased function. The left ventricle  demonstrates global hypokinesis. The left ventricular internal cavity size  was mildly dilated. There is mild left  ventricular hypertrophy. Left ventricular diastolic parameters are  consistent with Grade I diastolic  dysfunction (impaired relaxation).  Consider Limited Echo with contrast to rule out LV thrombus.   3. Right ventricular systolic function is normal. The right ventricular  size is normal.   Comparison(s): No significant change from prior study.  Echocardiogram on November 23, 2022:  1. Left ventricular ejection fraction, by estimation, is 25 to 30%. The  left ventricle has severely decreased function. The left ventricle  demonstrates global hypokinesis. Left ventricular diastolic parameters are  indeterminate. Elevated left atrial  pressure. The average left ventricular global longitudinal strain is -14.0  %. The global longitudinal strain is abnormal.   2. Right ventricular systolic function is normal. The right ventricular  size is normal.   3. Left atrial size was severely dilated.   4. The mitral valve is normal in structure. Trivial mitral valve  regurgitation. No evidence of mitral stenosis.   5. The tricuspid valve is abnormal.   6. The aortic valve has an indeterminant number of cusps. Aortic valve  regurgitation is not visualized. No aortic stenosis is present.   7. The inferior vena cava is normal in size with greater than 50%  respiratory variability, suggesting right atrial pressure of 3 mmHg.   Comparison(s): Echocardiogram done 09/08/21 showed an EF of 45-50%.  Physical Exam:    VS:  BP 116/72 (BP Location: Left Arm)   Pulse 81   Ht 5' 5 (1.651 m)   Wt 235 lb 3.2 oz (106.7 kg)   SpO2 97%   BMI 39.14 kg/m     Wt Readings from Last 3 Encounters:  11/06/24 235 lb 3.2 oz (106.7 kg)  09/25/24 236 lb 3.2 oz (107.1 kg)  09/01/24 235 lb 3.2 oz (106.7 kg)    GEN: Obese, 68 y.o. female in no acute distress HEENT: Normal NECK: No JVD; no carotid bruit on left, right carotid bruit noted. CARDIAC: S1/S2, RRR, no murmurs, rubs, gallops; 2+ peripheral pulses throughout, strong and equal bilaterally RESPIRATORY:  Clear to auscultation without rales, wheezing or rhonchi   MUSCULOSKELETAL:  No edema to BLE; No deformity SKIN: Warm and dry NEUROLOGIC:  Alert and oriented x 3 PSYCHIATRIC:  Normal affect   ASSESSMENT & PLAN:     HFrEF, NICM Stage C, NYHA class I symptoms.  Limited TTE 07/2023 revealed EF 20 to 25%, repeat Echo 02/2024 revealed EF 20 to 25%.  NICM. Euvolemic and well compensated on exam. See cardiac MRI results noted above.  Continue current medication regimen.  Low sodium diet, fluid restriction <2L, and daily weights encouraged. Educated to contact our office for weight gain of 2 lbs overnight or 5 lbs in one week. Heart healthy diet and regular cardiovascular exercise encouraged.  Will obtain echo to evaluate EF and if EF does not improve with next echo, consider EP referral.    2. HTN BP today stable. Continue current medication regimen. Discussed to monitor BP at home at least 2 hours after medications and sitting for 5-10 minutes. Heart healthy diet and regular cardiovascular exercise encouraged.   3. Obesity Weight loss via  diet and exercise encouraged. Discussed the impact being overweight would have on cardiovascular risk.  4. Tobacco abuse Smoking cessation encouraged and discussed.    5. Mixed HLD Most recent LDL 52.  She is currently at goal.  No medication changes at this time.  Heart healthy diet and regular cardiovascular exercise encouraged.   6.  Right carotid bruit Noted on exam, will obtain carotid duplex in addition to echo as noted above.    Disposition: Follow-up with Dr. Debera or APP in 3-4 months or sooner if anything changes.   Medication Adjustments/Labs and Tests Ordered: Current medicines are reviewed at length with the patient today.  Concerns regarding medicines are outlined above.  Orders Placed This Encounter  Procedures   ECHOCARDIOGRAM COMPLETE   VAS US  CAROTID   No orders of the defined types were placed in this encounter.   Patient Instructions  Medication Instructions:  Your physician  recommends that you continue on your current medications as directed. Please refer to the Current Medication list given to you today.   Labwork: None  Testing/Procedures: Your physician has requested that you have a carotid duplex. This test is an ultrasound of the carotid arteries in your neck. It looks at blood flow through these arteries that supply the brain with blood. Allow one hour for this exam. There are no restrictions or special instructions.  Your physician has requested that you have an echocardiogram in 3 months. Echocardiography is a painless test that uses sound waves to create images of your heart. It provides your doctor with information about the size and shape of your heart and how well your heart's chambers and valves are working. This procedure takes approximately one hour. There are no restrictions for this procedure. Please do NOT wear cologne, perfume, aftershave, or lotions (deodorant is allowed). Please arrive 15 minutes prior to your appointment time.  Please note: We ask at that you not bring children with you during ultrasound (echo/ vascular) testing. Due to room size and safety concerns, children are not allowed in the ultrasound rooms during exams. Our front office staff cannot provide observation of children in our lobby area while testing is being conducted. An adult accompanying a patient to their appointment will only be allowed in the ultrasound room at the discretion of the ultrasound technician under special circumstances. We apologize for any inconvenience.   Follow-Up: Your physician recommends that you schedule a follow-up appointment in: 4 months  Any Other Special Instructions Will Be Listed Below (If Applicable). [facility] you for choosing  [facility]!     If you need a refill on your cardiac medications before your next appointment, please call your pharmacy.    Signed, Almarie Crate, NP

## 2024-11-16 ENCOUNTER — Other Ambulatory Visit: Payer: Self-pay | Admitting: Nurse Practitioner

## 2024-11-19 ENCOUNTER — Encounter (INDEPENDENT_AMBULATORY_CARE_PROVIDER_SITE_OTHER): Payer: Self-pay | Admitting: Gastroenterology

## 2024-11-23 ENCOUNTER — Other Ambulatory Visit: Payer: Self-pay | Admitting: Physician Assistant

## 2024-11-24 ENCOUNTER — Other Ambulatory Visit (HOSPITAL_BASED_OUTPATIENT_CLINIC_OR_DEPARTMENT_OTHER): Payer: Self-pay

## 2024-11-24 NOTE — Telephone Encounter (Signed)
 Last Fill: 09/01/2024  Labs: 08/04/2024 CBC and CMP WNL   Next Visit: 02/04/2025  Last Visit: 09/01/2024  DX:  Rheumatoid arthritis with rheumatoid factor of multiple sites without organ or systems involvement    Current Dose per office note 09/01/2024: Methotrexate  8 tablets by mouth once weekly   Patient advised she is due to update her lab work. Patient states she will update labs this week.   Okay to refill Methotrexate ?

## 2024-11-25 ENCOUNTER — Other Ambulatory Visit: Payer: Self-pay | Admitting: *Deleted

## 2024-11-25 ENCOUNTER — Telehealth: Payer: Self-pay | Admitting: Rheumatology

## 2024-11-25 DIAGNOSIS — Z13220 Encounter for screening for lipoid disorders: Secondary | ICD-10-CM

## 2024-11-25 DIAGNOSIS — Z79899 Other long term (current) drug therapy: Secondary | ICD-10-CM

## 2024-11-25 DIAGNOSIS — M0579 Rheumatoid arthritis with rheumatoid factor of multiple sites without organ or systems involvement: Secondary | ICD-10-CM

## 2024-11-25 NOTE — Telephone Encounter (Signed)
 Lab Orders released.

## 2024-11-25 NOTE — Telephone Encounter (Signed)
 Patient contacted the office requesting lab orders to be be release to [facility] in [place].   Patient plans to have labs on right now.

## 2024-11-26 ENCOUNTER — Ambulatory Visit: Payer: Self-pay | Admitting: Physician Assistant

## 2024-11-26 LAB — CBC WITH DIFFERENTIAL/PLATELET
Absolute Lymphocytes: 1874 {cells}/uL (ref 850–3900)
Absolute Monocytes: 587 {cells}/uL (ref 200–950)
Basophils Absolute: 33 {cells}/uL (ref 0–200)
Basophils Relative: 0.5 %
Eosinophils Absolute: 53 {cells}/uL (ref 15–500)
Eosinophils Relative: 0.8 %
HCT: 41.6 % (ref 35.9–46.0)
Hemoglobin: 13.7 g/dL (ref 11.7–15.5)
MCH: 29.7 pg (ref 27.0–33.0)
MCHC: 32.9 g/dL (ref 31.6–35.4)
MCV: 90 fL (ref 81.4–101.7)
MPV: 10.1 fL (ref 7.5–12.5)
Monocytes Relative: 8.9 %
Neutro Abs: 4052 {cells}/uL (ref 1500–7800)
Neutrophils Relative %: 61.4 %
Platelets: 250 Thousand/uL (ref 140–400)
RBC: 4.62 Million/uL (ref 3.80–5.10)
RDW: 13.9 % (ref 11.0–15.0)
Total Lymphocyte: 28.4 %
WBC: 6.6 Thousand/uL (ref 3.8–10.8)

## 2024-11-26 LAB — COMPREHENSIVE METABOLIC PANEL WITH GFR
AG Ratio: 1.5 (calc) (ref 1.0–2.5)
ALT: 9 U/L (ref 6–29)
AST: 12 U/L (ref 10–35)
Albumin: 4 g/dL (ref 3.6–5.1)
Alkaline phosphatase (APISO): 85 U/L (ref 37–153)
BUN: 8 mg/dL (ref 7–25)
CO2: 28 mmol/L (ref 20–32)
Calcium: 8.9 mg/dL (ref 8.6–10.4)
Chloride: 106 mmol/L (ref 98–110)
Creat: 0.74 mg/dL (ref 0.50–1.05)
Globulin: 2.6 g/dL (ref 1.9–3.7)
Glucose, Bld: 83 mg/dL (ref 65–139)
Potassium: 4.1 mmol/L (ref 3.5–5.3)
Sodium: 143 mmol/L (ref 135–146)
Total Bilirubin: 0.7 mg/dL (ref 0.2–1.2)
Total Protein: 6.6 g/dL (ref 6.1–8.1)
eGFR: 89 mL/min/1.73m2 (ref >=60)

## 2024-11-26 LAB — LIPID PANEL
Cholesterol: 122 mg/dL (ref <200)
HDL: 56 mg/dL (ref >=50)
LDL Cholesterol (Calc): 51 mg/dL
Non-HDL Cholesterol (Calc): 66 mg/dL (ref <130)
Total CHOL/HDL Ratio: 2.2 (calc) (ref <5.0)
Triglycerides: 70 mg/dL (ref <150)

## 2024-11-26 NOTE — Progress Notes (Signed)
CBC and CMP WNL  Lipid panel WNL

## 2024-11-27 ENCOUNTER — Ambulatory Visit: Attending: Nurse Practitioner

## 2024-11-27 ENCOUNTER — Other Ambulatory Visit (HOSPITAL_BASED_OUTPATIENT_CLINIC_OR_DEPARTMENT_OTHER): Payer: Self-pay

## 2024-11-27 DIAGNOSIS — R0989 Other specified symptoms and signs involving the circulatory and respiratory systems: Secondary | ICD-10-CM | POA: Diagnosis not present

## 2024-12-01 ENCOUNTER — Other Ambulatory Visit: Payer: Self-pay | Admitting: Nurse Practitioner

## 2024-12-01 ENCOUNTER — Ambulatory Visit: Payer: Self-pay | Admitting: Nurse Practitioner

## 2024-12-04 ENCOUNTER — Ambulatory Visit: Admitting: Nurse Practitioner

## 2024-12-06 ENCOUNTER — Other Ambulatory Visit: Payer: Self-pay | Admitting: Nurse Practitioner

## 2024-12-29 ENCOUNTER — Other Ambulatory Visit: Payer: Self-pay | Admitting: Physician Assistant

## 2024-12-29 ENCOUNTER — Other Ambulatory Visit (HOSPITAL_BASED_OUTPATIENT_CLINIC_OR_DEPARTMENT_OTHER): Payer: Self-pay

## 2024-12-29 NOTE — Telephone Encounter (Signed)
 Last Fill: 11/24/2024  Labs: 11/25/2024 CBC and CMP WNL Lipid panel WNL  Next Visit: 02/04/2025  Last Visit: 09/01/2024  DX: Rheumatoid arthritis with rheumatoid factor of multiple sites without organ or systems involvement (HCC)   Current Dose per office note 09/01/2024: Methotrexate  8 tablets by mouth once weekly   Okay to refill Methotrexate ?

## 2025-01-12 ENCOUNTER — Other Ambulatory Visit (HOSPITAL_BASED_OUTPATIENT_CLINIC_OR_DEPARTMENT_OTHER): Payer: Self-pay

## 2025-01-13 ENCOUNTER — Other Ambulatory Visit: Payer: Self-pay

## 2025-01-13 ENCOUNTER — Other Ambulatory Visit (HOSPITAL_BASED_OUTPATIENT_CLINIC_OR_DEPARTMENT_OTHER): Payer: Self-pay

## 2025-01-13 MED ORDER — DAPAGLIFLOZIN PROPANEDIOL 10 MG PO TABS
10.0000 mg | ORAL_TABLET | Freq: Every day | ORAL | 3 refills | Status: AC
  Filled 2025-01-13: qty 30, 30d supply, fill #0

## 2025-01-14 ENCOUNTER — Other Ambulatory Visit (HOSPITAL_BASED_OUTPATIENT_CLINIC_OR_DEPARTMENT_OTHER): Payer: Self-pay

## 2025-01-14 MED ORDER — ALBUTEROL SULFATE HFA 108 (90 BASE) MCG/ACT IN AERS
2.0000 | INHALATION_SPRAY | RESPIRATORY_TRACT | 0 refills | Status: AC
  Filled 2025-01-14: qty 6.7, 16d supply, fill #0

## 2025-01-14 MED ORDER — DOXYCYCLINE HYCLATE 100 MG PO TABS
100.0000 mg | ORAL_TABLET | Freq: Two times a day (BID) | ORAL | 0 refills | Status: AC
  Filled 2025-01-14: qty 20, 10d supply, fill #0

## 2025-01-19 ENCOUNTER — Ambulatory Visit (INDEPENDENT_AMBULATORY_CARE_PROVIDER_SITE_OTHER): Admitting: Gastroenterology

## 2025-01-20 NOTE — Progress Notes (Unsigned)
 "  Office Visit Note  Patient: Caroline Mason             Date of Birth: [birth year]           MRN: [id number]             PCP: [facility] Referring: [facility] Visit Date: 02/03/2025 Occupation: Data Unavailable  Subjective:  No chief complaint on file.   History of Present Illness: Caroline Mason is a 69 y.o. female ***     Activities of Daily Living:  Patient reports morning stiffness for *** {minute/hour:19697}.   Patient {ACTIONS;DENIES/REPORTS:21021675::Denies} nocturnal pain.  Difficulty dressing/grooming: {ACTIONS;DENIES/REPORTS:21021675::Denies} Difficulty climbing stairs: {ACTIONS;DENIES/REPORTS:21021675::Denies} Difficulty getting out of chair: {ACTIONS;DENIES/REPORTS:21021675::Denies} Difficulty using hands for taps, buttons, cutlery, and/or writing: {ACTIONS;DENIES/REPORTS:21021675::Denies}  No Rheumatology ROS completed.   PMFS History:  Patient Active Problem List   Diagnosis Date Noted   Allergy to alpha-gal 09/25/2023   Chronic diarrhea 01/29/2023   Abdominal pain 01/29/2023   IBS (irritable bowel syndrome) 01/29/2023   Everitt Curt disease (radial styloid tenosynovitis) 09/14/2022   Abdominal pain, left lower quadrant 06/08/2022   Loose stools 06/08/2022   Unspecified rotator cuff tear or rupture of right shoulder, not specified as traumatic 02/15/2022   Ganglion cyst of wrist, right 08/14/2018   Trigger thumb, right thumb 03/27/2018   High risk medication use 12/01/2016   Rheumatoid arthritis with rheumatoid factor of multiple sites without organ or systems involvement (HCC) 12/01/2016   Fibromyalgia 12/01/2016   Primary osteoarthritis of both hands 12/01/2016   Primary osteoarthritis of both knees 12/01/2016   Primary osteoarthritis of both feet 12/01/2016   Bradycardia 07/18/2011   Chronic systolic heart failure (HCC) 06/05/2011   ANEMIA 03/01/2011   TOBACCO ABUSE 01/03/2011   OBSTRUCTIVE SLEEP  APNEA 01/03/2011   ESSENTIAL HYPERTENSION, BENIGN 01/03/2011   Secondary cardiomyopathy (HCC) 01/03/2011   Other specified chronic obstructive pulmonary disease (HCC) 01/03/2011    Past Medical History:  Diagnosis Date   Atypical pneumonia    12/11   Cardiomyopathy    Nonischemic, normal coronaries, LVEF 40-45% >> LVEF 25-30% 10/2022   COPD (chronic obstructive pulmonary disease) (HCC)    Depression    Diverticulitis    per patient   Diverticulosis    Essential hypertension    Fibromyalgia    Heel spur    bilateral per patient   Morbid obesity (HCC)    Obstructive sleep apnea    Rheumatoid arthritis(714.0)     Family History  Problem Relation Age of Onset   Stroke Mother    Hypertension Mother    Heart failure Sister 64   Breast cancer Sister    Heart failure Sister 27   Cancer Sister    Ovarian cancer Sister    Diabetes Maternal Grandmother    Diabetes Paternal Grandfather    Past Surgical History:  Procedure Laterality Date   COLONOSCOPY WITH PROPOFOL  N/A 07/25/2022   Procedure: COLONOSCOPY WITH PROPOFOL ;  Surgeon: Eartha Angelia Sieving, MD;  Location: [facility];  Service: Gastroenterology;  Laterality: N/A;  815   CYST REMOVAL TRUNK     POLYPECTOMY  07/25/2022   Procedure: POLYPECTOMY;  Surgeon: Eartha Angelia Sieving, MD;  Location: [facility];  Service: Gastroenterology;;  cecal and ascending;descending   RIGHT/LEFT HEART CATH AND CORONARY ANGIOGRAPHY N/A 04/30/2024   Procedure: RIGHT/LEFT HEART CATH AND CORONARY ANGIOGRAPHY;  Surgeon: Anner Alm ORN, MD;  Location: [facility] INVASIVE CV LAB;  Service: Cardiovascular;  Laterality: N/A;   ROTATOR  CUFF REPAIR Right 02/2022   TUBAL LIGATION  1989   WRIST SURGERY  2016   Social History[1] Social History   Social History Narrative   Lives in [place].     Immunization History  Administered Date(s) Administered   Influenza-Unspecified 09/24/2014, 10/25/2018   Moderna Sars-Covid-2 Vaccination  02/09/2020, 03/11/2020, 11/30/2020     Objective: Vital Signs: There were no vitals taken for this visit.   Physical Exam   Musculoskeletal Exam: ***  CDAI Exam: CDAI Score: -- Patient Global: --; Provider Global: -- Swollen: --; Tender: -- Joint Exam 02/03/2025   No joint exam has been documented for this visit   There is currently no information documented on the homunculus. Go to the Rheumatology activity and complete the homunculus joint exam.  Investigation: No additional findings.  Imaging: No results found.  Recent Labs: Lab Results  Component Value Date   WBC 6.6 11/25/2024   HGB 13.7 11/25/2024   PLT 250 11/25/2024   NA 143 11/25/2024   K 4.1 11/25/2024   CL 106 11/25/2024   CO2 28 11/25/2024   GLUCOSE 83 11/25/2024   BUN 8 11/25/2024   CREATININE 0.74 11/25/2024   BILITOT 0.7 11/25/2024   ALKPHOS 102 12/05/2016   AST 12 11/25/2024   ALT 9 11/25/2024   PROT 6.6 11/25/2024   ALBUMIN 4.0 12/05/2016   CALCIUM 8.9 11/25/2024   GFRAA 108 09/29/2020   QFTBGOLDPLUS NEGATIVE 02/21/2023    Speciality Comments: PLQ Eye Exam: 08/05/2024 WNL @ [facility] follow up 1 year  Patient said she has an appointment in August 2025  Procedures:  No procedures performed Allergies: Aspirin    Assessment / Plan:     Visit Diagnoses: No diagnosis found.  Orders: No orders of the defined types were placed in this encounter.  No orders of the defined types were placed in this encounter.   Face-to-face time spent with patient was *** minutes. Greater than 50% of time was spent in counseling and coordination of care.  Follow-Up Instructions: No follow-ups on file.   Daved JAYSON Gavel, CMA  Note - This record has been created using [employer]ist.  Chart creation errors have been sought, but may not always  have been located. Such creation errors do not reflect on  the standard of medical care.    [1]  Social History Tobacco Use   Smoking  status: Some Days    Current packs/day: 0.25    Average packs/day: 0.3 packs/day for 48.6 years (12.2 ttl pk-yrs)    Types: Cigarettes    Start date: 06/03/1976    Passive exposure: Past   Smokeless tobacco: Never   Tobacco comments:    smokes 3-4 per day again  Vaping Use   Vaping status: Never Used  Substance Use Topics   Alcohol use: No    Alcohol/week: 0.0 standard drinks of alcohol   Drug use: No   "

## 2025-01-26 ENCOUNTER — Telehealth: Payer: Self-pay | Admitting: Nurse Practitioner

## 2025-01-26 ENCOUNTER — Other Ambulatory Visit (HOSPITAL_BASED_OUTPATIENT_CLINIC_OR_DEPARTMENT_OTHER): Payer: Self-pay

## 2025-01-26 MED ORDER — DOXYCYCLINE HYCLATE 100 MG PO TABS
100.0000 mg | ORAL_TABLET | Freq: Two times a day (BID) | ORAL | 0 refills | Status: AC
  Filled 2025-01-26: qty 10, 5d supply, fill #0

## 2025-01-26 NOTE — Telephone Encounter (Signed)
 Reports being diagnosed last week with an URI and was given ABT. Reports SOB improved but has now returned so she was started on a new ABT. Reports SOB Is worse when she moves around. Does not sound SOB on the phone and is able to make full sentences without pauses. Denies swelling or weight gain. Medications reviewed. Reports she was advised to take an extra furosemide  today by her PCP. Has an appointment for an echocardiogram tomorrow. Gave sooner appointment to see Miriam on 01/28/2025 @11 :20 am. ER precautions advised for worsening symptoms. Verbalized understanding of plan.

## 2025-01-26 NOTE — Telephone Encounter (Signed)
 Pt c/o Shortness Of Breath: STAT if SOB developed within the last 24 hours or pt is noticeably SOB on the phone  1. Are you currently SOB (can you hear that pt is SOB on the phone)?  no  2. How long have you been experiencing SOB? Couple months   3. Are you SOB when sitting or when up moving around? Move around   4. Are you currently experiencing any other symptoms?  No   Please advise.

## 2025-01-27 ENCOUNTER — Ambulatory Visit

## 2025-01-27 DIAGNOSIS — I5020 Unspecified systolic (congestive) heart failure: Secondary | ICD-10-CM

## 2025-01-27 LAB — ECHOCARDIOGRAM COMPLETE
AR max vel: 1.77 cm2
AV Area VTI: 1.42 cm2
AV Area mean vel: 1.46 cm2
AV Mean grad: 6.8 mmHg
AV Peak grad: 11.8 mmHg
Ao pk vel: 1.72 m/s
Area-P 1/2: 6.54 cm2
Calc EF: 20 %
S' Lateral: 5.4 cm
Single Plane A2C EF: 25.4 %
Single Plane A4C EF: 16.7 %

## 2025-01-28 ENCOUNTER — Encounter: Payer: Self-pay | Admitting: Nurse Practitioner

## 2025-01-28 ENCOUNTER — Ambulatory Visit: Admitting: Nurse Practitioner

## 2025-01-28 VITALS — BP 120/62 | HR 52 | Ht 65.0 in | Wt 235.2 lb

## 2025-01-28 DIAGNOSIS — R0602 Shortness of breath: Secondary | ICD-10-CM

## 2025-01-28 NOTE — Progress Notes (Unsigned)
 " Cardiology Office Note:   Date: 11/06/2024 ID:  Caroline Mason, DOB 21-Apr-1956, MRN [id number] PCP:  [facility] Providers Cardiologist:  Jayson Sierras, MD    Referring MD: [facility]   CC: Here for follow-up  History of Present Illness:    Caroline Mason is a delightful 69 y.o. female with a PMH of chronic systolic CHF, secondary CM, HTN, OSA, COPD, RA, de Quervain's disease, tobacco abuse, anemia, fibromyalgia, mixed HLD, and diverticulosis, who presents today for ED follow-up.   In 2013, Echo revealed 45 to 50%, grade 1 DD, with diffuse hypokinesis, mild left atrial enlargement, was felt to be nonischemic as previous cath revealed normal coronary arteries.    Seen by Dr. Sierras on May 24, 2022.  She reported dyspnea with typical activities, was not fluid up. TTE 10/2022 EF to 25 to 30%, global hypokinesis of left ventricle, severely dilated left atrial size, trivial MR, otherwise no other significant valvular abnormalities. At follow-up, was discovered EF dropped d/t pt stopped taking Entresto  in the early part of summer due to not being able to afford Entresto , had restarted it shortly prior to office visit. Was tolerating all her medications well. Was smoking about 6 cigarettes/day.  Last seen on December 10, 2023. Was doing well at the time.   TTE was updated once on max titrated GDMT and revealed EF 20-25%, grade 1 DD, no significant valvular abnormalities.   03/10/2024 - She presents today for follow-up for HFrEF.  Doing well.  Admits to atypical twinges of chest pain, not bothersome per her report.  Admits to noticing some areas along bilateral medial hands of vitiligo. Denies any shortness of breath, syncope, presyncope, dizziness, orthopnea, PND, swelling or significant weight changes, acute bleeding, or claudication.  Does admit to some palpitations at times.  04/21/2024 -presents today for follow-up.  Admits to some  occasional, frequent but brief palpitations.  Says it feels like a funny feeling, lasting only for few seconds.  States that happens around 2-3 times per week, and sometimes multiple times per day.  Denies any overt sensation of fluid overload.  Does admit to use of 3 pillows at night when she is sleeping.  Says sometimes she does find herself trying to catch her breath, but denies any PND. Denies any chest pain, shortness of breath, syncope, presyncope, dizziness, PND, swelling or significant weight changes, acute bleeding, or claudication.  05/29/2024 - Here today for follow-up.  Doing well since I last saw her.  Palpitations have improved.  Says she is overall feeling very well. Denies any chest pain, shortness of breath, syncope, presyncope, dizziness, orthopnea, PND, swelling or significant weight changes, acute bleeding, or claudication.  Recent ED visit on 08/22/2024 for CHF at [facility].   08/27/2024 - Today she presents for follow-up. Tells me she is doing better since leaving the hospital. Currently finishing up Lasix  80 mg daily (was told to do this for 5-7 days after leaving the hospital). Doing well. Denies any chest pain, shortness of breath, palpitations, syncope, presyncope, dizziness, orthopnea, PND, significant weight changes, acute bleeding, or claudication. Does note some minimal swelling to lateral aspect of right ankle, otherwise doing well.   11/06/2024 -here for follow-up and doing well from a cardiac standpoint.  Admits to torn tendon along her right foot, otherwise doing well. Denies any chest pain, shortness of breath, palpitations, syncope, presyncope, dizziness, orthopnea, PND, swelling or significant weight changes, acute bleeding, or claudication.  ROS:  Please see the history of present illness.    All other systems reviewed and are negative.  EKGs/Labs/Other Studies Reviewed:    The following studies were reviewed today:  EKG: EKG is not ordered  today.  Right/left heart cath 04/2024:    Ost LAD to Prox LAD lesion is 20% stenosed.   Hemodynamic findings consistent with moderate pulmonary hypertension.   There is no aortic valve stenosis.   In the absence of any other complications or medical issues, we expect the patient to be ready for discharge from a cath perspective on 04/30/2024.   Continue titrate afterload reduction.  Would recommend daily diuretic with PRN dosing.   No indication for antiplatelet therapy at this time .   Dominance: Right    Angiographically normal coronary arteries with a codominant system Moderate Pulmonary Hypertension- WHO Class II: PAP-mean 44/26-35 mmHg with a PCWP of 26 mmHg and LVEDP of 32 mmHg.  Cardiac output of index 5.44-2.55.      RECOMMENDATIONS   In the absence of any other complications or medical issues, we expect the patient to be ready for discharge from a cath perspective on 04/30/2024.   Continue titrate afterload reduction.  Would recommend daily diuretic with PRN dosing.   No indication for antiplatelet therapy at this time .    cMRI 03/2024:  IMPRESSION: 1. Moderately dilated left ventricle with LV EF 31%. Diffuse hypokinesis.   2.  Normal RV size and systolic function, RV EF 50%.   3. Mid-wall LGE in the basal-mid inferoseptal wall, basal inferior wall, and basal anterior wall. This is a non-coronary pattern of LGE, query prior myocarditis. Subendocardial LGE in the basal inferolateral wall, this may suggest an area of prior subendocardial MI.   4. Elevated extracellular volume percentage at 35%, this suggests increased myocardial fibrosis.  Cardiac monitor 03/2024:  ZIO monitor reviewed.  13 days, 7 hours analyzed.   Predominant rhythm is sinus with heart rate ranging from 55 bpm up to 112 bpm and average heart rate 73 bpm. There were rare PACs including atrial couplets and triplets representing less than 1% total beats. There were occasional PVCs representing 4.8% total  beats with otherwise rare ventricular couplets and triplets as well as limited ventricular bigeminy and trigeminy. Three brief episodes of NSVT were noted, the longest of which was 4 beats. Multiple (20) brief episodes of PSVT were noted, the longest of which lasted 17 beats.  There were no sustained arrhythmias. No pauses or high degree heart block.  Echo 02/2024:  1. Left ventricular ejection fraction, by estimation, is 20 to 25%. The  left ventricle has severely decreased function. The left ventricle  demonstrates global hypokinesis. The left ventricular internal cavity size  was mildly dilated. Left ventricular  diastolic parameters are consistent with Grade I diastolic dysfunction  (impaired relaxation). Elevated left atrial pressure. The average left  ventricular global longitudinal strain is -14.2 %. The global longitudinal  strain is abnormal.   2. Right ventricular systolic function is normal. The right ventricular  size is normal. There is normal pulmonary artery systolic pressure.   3. The mitral valve is normal in structure. No evidence of mitral valve  regurgitation. No evidence of mitral stenosis.   4. The aortic valve is tricuspid. Aortic valve regurgitation is not  visualized. No aortic stenosis is present.   5. The inferior vena cava is normal in size with greater than 50%  respiratory variability, suggesting right atrial pressure of 3 mmHg.   Comparison(s): A  prior study was performed on 08/21/2023. EF 20-25%. IV  unsuccessful for use of definity.  Limited echo 07/2023: 1. Limited Echo for LVEF assessment.   2. Left ventricular ejection fraction, by estimation, is 20 to 25%. The  left ventricle has severely decreased function. The left ventricle  demonstrates global hypokinesis. The left ventricular internal cavity size  was mildly dilated. There is mild left  ventricular hypertrophy. Left ventricular diastolic parameters are  consistent with Grade I diastolic  dysfunction (impaired relaxation).  Consider Limited Echo with contrast to rule out LV thrombus.   3. Right ventricular systolic function is normal. The right ventricular  size is normal.   Comparison(s): No significant change from prior study.  Echocardiogram on November 23, 2022:  1. Left ventricular ejection fraction, by estimation, is 25 to 30%. The  left ventricle has severely decreased function. The left ventricle  demonstrates global hypokinesis. Left ventricular diastolic parameters are  indeterminate. Elevated left atrial  pressure. The average left ventricular global longitudinal strain is -14.0  %. The global longitudinal strain is abnormal.   2. Right ventricular systolic function is normal. The right ventricular  size is normal.   3. Left atrial size was severely dilated.   4. The mitral valve is normal in structure. Trivial mitral valve  regurgitation. No evidence of mitral stenosis.   5. The tricuspid valve is abnormal.   6. The aortic valve has an indeterminant number of cusps. Aortic valve  regurgitation is not visualized. No aortic stenosis is present.   7. The inferior vena cava is normal in size with greater than 50%  respiratory variability, suggesting right atrial pressure of 3 mmHg.   Comparison(s): Echocardiogram done 09/08/21 showed an EF of 45-50%.  Physical Exam:    VS:  There were no vitals taken for this visit.    Wt Readings from Last 3 Encounters:  11/06/24 235 lb 3.2 oz (106.7 kg)  09/25/24 236 lb 3.2 oz (107.1 kg)  09/01/24 235 lb 3.2 oz (106.7 kg)    GEN: Obese, 69 y.o. female in no acute distress HEENT: Normal NECK: No JVD; no carotid bruit on left, right carotid bruit noted. CARDIAC: S1/S2, RRR, no murmurs, rubs, gallops; 2+ peripheral pulses throughout, strong and equal bilaterally RESPIRATORY:  Clear to auscultation without rales, wheezing or rhonchi  MUSCULOSKELETAL:  No edema to BLE; No deformity SKIN: Warm and dry NEUROLOGIC:  Alert  and oriented x 3 PSYCHIATRIC:  Normal affect   ASSESSMENT & PLAN:     HFrEF, NICM Stage C, NYHA class I symptoms.  Limited TTE 07/2023 revealed EF 20 to 25%, repeat Echo 02/2024 revealed EF 20 to 25%.  NICM. Euvolemic and well compensated on exam. See cardiac MRI results noted above.  Continue current medication regimen.  Low sodium diet, fluid restriction <2L, and daily weights encouraged. Educated to contact our office for weight gain of 2 lbs overnight or 5 lbs in one week. Heart healthy diet and regular cardiovascular exercise encouraged.  Will obtain echo to evaluate EF and if EF does not improve with next echo, consider EP referral.    2. HTN BP today stable. Continue current medication regimen. Discussed to monitor BP at home at least 2 hours after medications and sitting for 5-10 minutes. Heart healthy diet and regular cardiovascular exercise encouraged.   3. Obesity Weight loss via diet and exercise encouraged. Discussed the impact being overweight would have on cardiovascular risk.  4. Tobacco abuse Smoking cessation encouraged and discussed.  5. Mixed HLD Most recent LDL 52.  She is currently at goal.  No medication changes at this time.  Heart healthy diet and regular cardiovascular exercise encouraged.   6.  Right carotid bruit Noted on exam, will obtain carotid duplex in addition to echo as noted above.    Disposition: Follow-up with Dr. Debera or APP in 3-4 months or sooner if anything changes.   Medication Adjustments/Labs and Tests Ordered: Current medicines are reviewed at length with the patient today.  Concerns regarding medicines are outlined above.  No orders of the defined types were placed in this encounter.  No orders of the defined types were placed in this encounter.   There are no Patient Instructions on file for this visit.   Signed, Almarie Crate, NP  "

## 2025-01-28 NOTE — Patient Instructions (Signed)
 Medication Instructions:  Your physician recommends that you continue on your current medications as directed. Please refer to the Current Medication list given to you today.   Labwork: None  Testing/Procedures: Your physician has recommended that you have a pulmonary function test. Pulmonary Function Tests are a group of tests that measure how well air moves in and out of your lungs.   Follow-Up: Your physician recommends that you schedule a follow-up appointment in: 3 months  Any Other Special Instructions Will Be Listed Below (If Applicable). Thank you for choosing [facility]!     If you need a refill on your cardiac medications before your next appointment, please call your pharmacy.

## 2025-02-03 ENCOUNTER — Ambulatory Visit: Admitting: Rheumatology

## 2025-02-03 DIAGNOSIS — Z8669 Personal history of other diseases of the nervous system and sense organs: Secondary | ICD-10-CM

## 2025-02-03 DIAGNOSIS — M0579 Rheumatoid arthritis with rheumatoid factor of multiple sites without organ or systems involvement: Secondary | ICD-10-CM

## 2025-02-03 DIAGNOSIS — M25571 Pain in right ankle and joints of right foot: Secondary | ICD-10-CM

## 2025-02-03 DIAGNOSIS — G8929 Other chronic pain: Secondary | ICD-10-CM

## 2025-02-03 DIAGNOSIS — Z79899 Other long term (current) drug therapy: Secondary | ICD-10-CM

## 2025-02-03 DIAGNOSIS — M19071 Primary osteoarthritis, right ankle and foot: Secondary | ICD-10-CM

## 2025-02-03 DIAGNOSIS — M654 Radial styloid tenosynovitis [de Quervain]: Secondary | ICD-10-CM

## 2025-02-03 DIAGNOSIS — M19041 Primary osteoarthritis, right hand: Secondary | ICD-10-CM

## 2025-02-03 DIAGNOSIS — Z8709 Personal history of other diseases of the respiratory system: Secondary | ICD-10-CM

## 2025-02-03 DIAGNOSIS — Z862 Personal history of diseases of the blood and blood-forming organs and certain disorders involving the immune mechanism: Secondary | ICD-10-CM

## 2025-02-03 DIAGNOSIS — M797 Fibromyalgia: Secondary | ICD-10-CM

## 2025-02-03 DIAGNOSIS — M170 Bilateral primary osteoarthritis of knee: Secondary | ICD-10-CM

## 2025-02-03 DIAGNOSIS — Z8639 Personal history of other endocrine, nutritional and metabolic disease: Secondary | ICD-10-CM

## 2025-02-04 ENCOUNTER — Ambulatory Visit: Admitting: Rheumatology

## 2025-02-05 ENCOUNTER — Ambulatory Visit

## 2025-02-09 ENCOUNTER — Ambulatory Visit (INDEPENDENT_AMBULATORY_CARE_PROVIDER_SITE_OTHER): Admitting: Gastroenterology

## 2025-03-17 ENCOUNTER — Encounter (HOSPITAL_COMMUNITY)

## 2025-04-06 ENCOUNTER — Ambulatory Visit: Admitting: Nurse Practitioner

## 2025-04-27 ENCOUNTER — Ambulatory Visit: Admitting: Cardiology
